# Patient Record
Sex: Male | Born: 1950 | Race: White | Hispanic: No | Marital: Single | Smoking: Current some day smoker
Health system: Southern US, Community
[De-identification: ages and names within clinical notes are randomized; demographics above are authoritative.]

## PROBLEM LIST (undated history)

## (undated) DIAGNOSIS — M5134 Other intervertebral disc degeneration, thoracic region: Secondary | ICD-10-CM

## (undated) DIAGNOSIS — IMO0002 Reserved for concepts with insufficient information to code with codable children: Secondary | ICD-10-CM

## (undated) DIAGNOSIS — M5020 Other cervical disc displacement, unspecified cervical region: Secondary | ICD-10-CM

## (undated) DIAGNOSIS — G039 Meningitis, unspecified: Secondary | ICD-10-CM

## (undated) DIAGNOSIS — I10 Essential (primary) hypertension: Secondary | ICD-10-CM

## (undated) DIAGNOSIS — K219 Gastro-esophageal reflux disease without esophagitis: Secondary | ICD-10-CM

## (undated) DIAGNOSIS — M51369 Other intervertebral disc degeneration, lumbar region without mention of lumbar back pain or lower extremity pain: Secondary | ICD-10-CM

## (undated) DIAGNOSIS — E1151 Type 2 diabetes mellitus with diabetic peripheral angiopathy without gangrene: Secondary | ICD-10-CM

## (undated) DIAGNOSIS — M4804 Spinal stenosis, thoracic region: Secondary | ICD-10-CM

## (undated) DIAGNOSIS — E039 Hypothyroidism, unspecified: Secondary | ICD-10-CM

## (undated) DIAGNOSIS — M1990 Unspecified osteoarthritis, unspecified site: Secondary | ICD-10-CM

## (undated) DIAGNOSIS — M5124 Other intervertebral disc displacement, thoracic region: Secondary | ICD-10-CM

## (undated) DIAGNOSIS — E349 Endocrine disorder, unspecified: Secondary | ICD-10-CM

## (undated) DIAGNOSIS — K861 Other chronic pancreatitis: Secondary | ICD-10-CM

## (undated) DIAGNOSIS — M5030 Other cervical disc degeneration, unspecified cervical region: Secondary | ICD-10-CM

## (undated) DIAGNOSIS — K449 Diaphragmatic hernia without obstruction or gangrene: Secondary | ICD-10-CM

## (undated) DIAGNOSIS — G8929 Other chronic pain: Secondary | ICD-10-CM

## (undated) DIAGNOSIS — D649 Anemia, unspecified: Secondary | ICD-10-CM

## (undated) DIAGNOSIS — IMO0001 Reserved for inherently not codable concepts without codable children: Secondary | ICD-10-CM

## (undated) DIAGNOSIS — J189 Pneumonia, unspecified organism: Secondary | ICD-10-CM

## (undated) DIAGNOSIS — M4807 Spinal stenosis, lumbosacral region: Secondary | ICD-10-CM

## (undated) DIAGNOSIS — E785 Hyperlipidemia, unspecified: Secondary | ICD-10-CM

## (undated) DIAGNOSIS — I82409 Acute embolism and thrombosis of unspecified deep veins of unspecified lower extremity: Secondary | ICD-10-CM

## (undated) DIAGNOSIS — I739 Peripheral vascular disease, unspecified: Secondary | ICD-10-CM

## (undated) DIAGNOSIS — M5136 Other intervertebral disc degeneration, lumbar region: Secondary | ICD-10-CM

## (undated) DIAGNOSIS — N529 Male erectile dysfunction, unspecified: Secondary | ICD-10-CM

## (undated) DIAGNOSIS — M4802 Spinal stenosis, cervical region: Secondary | ICD-10-CM

## (undated) DIAGNOSIS — R011 Cardiac murmur, unspecified: Secondary | ICD-10-CM

## (undated) HISTORY — PX: BACK SURGERY: SHX140

## (undated) HISTORY — DX: Other intervertebral disc degeneration, lumbar region without mention of lumbar back pain or lower extremity pain: M51.369

## (undated) HISTORY — DX: Type 2 diabetes mellitus with diabetic peripheral angiopathy without gangrene: E11.51

## (undated) HISTORY — DX: Diaphragmatic hernia without obstruction or gangrene: K44.9

## (undated) HISTORY — PX: GASTROJEJUNOSTOMY: SHX1697

## (undated) HISTORY — DX: Spinal stenosis, thoracic region: M48.04

## (undated) HISTORY — DX: Hyperlipidemia, unspecified: E78.5

## (undated) HISTORY — DX: Hypothyroidism, unspecified: E03.9

## (undated) HISTORY — PX: LEG SURGERY: SHX1003

## (undated) HISTORY — DX: Male erectile dysfunction, unspecified: N52.9

## (undated) HISTORY — PX: KIDNEY STONE SURGERY: SHX686

## (undated) HISTORY — DX: Reserved for concepts with insufficient information to code with codable children: IMO0002

## (undated) HISTORY — DX: Essential (primary) hypertension: I10

## (undated) HISTORY — DX: Reserved for inherently not codable concepts without codable children: IMO0001

## (undated) HISTORY — DX: Other intervertebral disc displacement, thoracic region: M51.24

## (undated) HISTORY — DX: Anemia, unspecified: D64.9

## (undated) HISTORY — DX: Unspecified osteoarthritis, unspecified site: M19.90

## (undated) HISTORY — DX: Other intervertebral disc degeneration, thoracic region: M51.34

## (undated) HISTORY — PX: SPINE SURGERY: SHX786

## (undated) HISTORY — DX: Other chronic pancreatitis: K86.1

## (undated) HISTORY — DX: Other intervertebral disc degeneration, lumbar region: M51.36

## (undated) HISTORY — DX: Other cervical disc degeneration, unspecified cervical region: M50.30

## (undated) HISTORY — DX: Pneumonia, unspecified organism: J18.9

## (undated) HISTORY — DX: Other cervical disc displacement, unspecified cervical region: M50.20

## (undated) HISTORY — DX: Other chronic pain: G89.29

## (undated) HISTORY — DX: Cardiac murmur, unspecified: R01.1

## (undated) HISTORY — DX: Acute embolism and thrombosis of unspecified deep veins of unspecified lower extremity: I82.409

## (undated) HISTORY — DX: Peripheral vascular disease, unspecified: I73.9

## (undated) HISTORY — DX: Meningitis, unspecified: G03.9

## (undated) HISTORY — DX: Gastro-esophageal reflux disease without esophagitis: K21.9

## (undated) HISTORY — DX: Endocrine disorder, unspecified: E34.9

## (undated) HISTORY — DX: Spinal stenosis, cervical region: M48.02

## (undated) HISTORY — DX: Spinal stenosis, lumbosacral region: M48.07

---

## 1993-07-16 HISTORY — PX: CARPAL TUNNEL RELEASE: SHX101

## 1993-07-16 HISTORY — PX: TRUNCAL VAGOTOMY: SHX2582

## 2000-10-18 ENCOUNTER — Emergency Department (HOSPITAL_COMMUNITY): Admission: EM | Admit: 2000-10-18 | Discharge: 2000-10-18 | Payer: Self-pay | Admitting: Emergency Medicine

## 2000-10-18 ENCOUNTER — Encounter: Payer: Self-pay | Admitting: Emergency Medicine

## 2002-03-14 ENCOUNTER — Emergency Department (HOSPITAL_COMMUNITY): Admission: EM | Admit: 2002-03-14 | Discharge: 2002-03-14 | Payer: Self-pay | Admitting: Emergency Medicine

## 2003-03-11 ENCOUNTER — Encounter: Payer: Self-pay | Admitting: Internal Medicine

## 2003-03-11 ENCOUNTER — Ambulatory Visit (HOSPITAL_COMMUNITY): Admission: RE | Admit: 2003-03-11 | Discharge: 2003-03-11 | Payer: Self-pay | Admitting: Internal Medicine

## 2008-05-28 ENCOUNTER — Encounter: Admission: RE | Admit: 2008-05-28 | Discharge: 2008-05-28 | Payer: Self-pay | Admitting: Internal Medicine

## 2008-08-04 ENCOUNTER — Ambulatory Visit: Payer: Self-pay | Admitting: Vascular Surgery

## 2009-04-07 ENCOUNTER — Ambulatory Visit: Payer: Self-pay | Admitting: Vascular Surgery

## 2009-08-25 ENCOUNTER — Ambulatory Visit: Payer: Self-pay | Admitting: Vascular Surgery

## 2009-09-05 ENCOUNTER — Ambulatory Visit (HOSPITAL_COMMUNITY): Admission: RE | Admit: 2009-09-05 | Discharge: 2009-09-05 | Payer: Self-pay | Admitting: Vascular Surgery

## 2009-09-05 ENCOUNTER — Ambulatory Visit: Payer: Self-pay | Admitting: Vascular Surgery

## 2009-10-16 ENCOUNTER — Encounter: Payer: Self-pay | Admitting: Emergency Medicine

## 2009-10-16 ENCOUNTER — Ambulatory Visit: Payer: Self-pay | Admitting: Vascular Surgery

## 2009-10-16 ENCOUNTER — Inpatient Hospital Stay (HOSPITAL_COMMUNITY): Admission: EM | Admit: 2009-10-16 | Discharge: 2009-10-24 | Payer: Self-pay | Admitting: Cardiothoracic Surgery

## 2009-10-17 ENCOUNTER — Encounter: Payer: Self-pay | Admitting: Vascular Surgery

## 2009-11-17 ENCOUNTER — Ambulatory Visit: Payer: Self-pay | Admitting: Vascular Surgery

## 2010-02-09 ENCOUNTER — Ambulatory Visit: Payer: Self-pay | Admitting: Vascular Surgery

## 2010-08-16 ENCOUNTER — Ambulatory Visit: Admit: 2010-08-16 | Payer: Self-pay | Admitting: Vascular Surgery

## 2010-08-16 ENCOUNTER — Ambulatory Visit: Payer: Self-pay | Admitting: Vascular Surgery

## 2010-09-13 ENCOUNTER — Ambulatory Visit: Payer: Self-pay | Admitting: Vascular Surgery

## 2010-10-04 ENCOUNTER — Ambulatory Visit: Payer: Self-pay | Admitting: Vascular Surgery

## 2010-10-04 LAB — CBC
HCT: 32.8 % — ABNORMAL LOW (ref 39.0–52.0)
HCT: 33.8 % — ABNORMAL LOW (ref 39.0–52.0)
Hemoglobin: 10.9 g/dL — ABNORMAL LOW (ref 13.0–17.0)
MCHC: 33.4 g/dL (ref 30.0–36.0)
MCHC: 33.7 g/dL (ref 30.0–36.0)
MCV: 92.4 fL (ref 78.0–100.0)
MCV: 93.7 fL (ref 78.0–100.0)
Platelets: 257 10*3/uL (ref 150–400)
Platelets: 277 10*3/uL (ref 150–400)
Platelets: 301 10*3/uL (ref 150–400)
RBC: 3.49 MIL/uL — ABNORMAL LOW (ref 4.22–5.81)
RDW: 12.9 % (ref 11.5–15.5)
RDW: 13 % (ref 11.5–15.5)
WBC: 5.9 10*3/uL (ref 4.0–10.5)
WBC: 6.4 10*3/uL (ref 4.0–10.5)

## 2010-10-04 LAB — DIFFERENTIAL
Basophils Absolute: 0 10*3/uL (ref 0.0–0.1)
Lymphocytes Relative: 32 % (ref 12–46)
Lymphs Abs: 2 10*3/uL (ref 0.7–4.0)
Neutrophils Relative %: 53 % (ref 43–77)

## 2010-10-04 LAB — BASIC METABOLIC PANEL
BUN: 12 mg/dL (ref 6–23)
BUN: 14 mg/dL (ref 6–23)
BUN: 22 mg/dL (ref 6–23)
CO2: 27 mEq/L (ref 19–32)
CO2: 30 mEq/L (ref 19–32)
Calcium: 8.5 mg/dL (ref 8.4–10.5)
Calcium: 9.1 mg/dL (ref 8.4–10.5)
Chloride: 101 mEq/L (ref 96–112)
Chloride: 109 mEq/L (ref 96–112)
Creatinine, Ser: 0.84 mg/dL (ref 0.4–1.5)
Creatinine, Ser: 0.98 mg/dL (ref 0.4–1.5)
GFR calc Af Amer: >60 mL/min (ref >60)
GFR calc non Af Amer: >60 mL/min (ref >60)
GFR calc non Af Amer: >60 mL/min (ref >60)
GFR calc non Af Amer: >60 mL/min (ref >60)
Glucose, Bld: 76 mg/dL (ref 70–99)
Glucose, Bld: 93 mg/dL (ref 70–99)
Potassium: 4.3 mEq/L (ref 3.5–5.1)
Potassium: 4.4 mEq/L (ref 3.5–5.1)
Sodium: 139 mEq/L (ref 135–145)

## 2010-10-04 LAB — URINALYSIS, ROUTINE W REFLEX MICROSCOPIC
Glucose, UA: NEGATIVE mg/dL
Nitrite: NEGATIVE
Protein, ur: NEGATIVE mg/dL
pH: 6 (ref 5.0–8.0)

## 2010-10-04 LAB — POCT I-STAT, CHEM 8
Chloride: 105 mEq/L (ref 96–112)
Creatinine, Ser: 1.3 mg/dL (ref 0.4–1.5)
Glucose, Bld: 88 mg/dL (ref 70–99)
HCT: 38 % — ABNORMAL LOW (ref 39.0–52.0)
Potassium: 4 mEq/L (ref 3.5–5.1)

## 2010-10-04 LAB — GLUCOSE, CAPILLARY: Glucose-Capillary: 104 mg/dL — ABNORMAL HIGH (ref 70–99)

## 2010-10-08 ENCOUNTER — Emergency Department (HOSPITAL_COMMUNITY)
Admission: EM | Admit: 2010-10-08 | Discharge: 2010-10-08 | Discharge status: Against Medical Advice | Payer: Medicare Other | Attending: Emergency Medicine | Admitting: Emergency Medicine

## 2010-10-08 DIAGNOSIS — R51 Headache: Secondary | ICD-10-CM | POA: Insufficient documentation

## 2010-10-18 ENCOUNTER — Ambulatory Visit: Payer: Self-pay | Admitting: Vascular Surgery

## 2010-11-08 ENCOUNTER — Encounter (INDEPENDENT_AMBULATORY_CARE_PROVIDER_SITE_OTHER): Payer: Medicare Other

## 2010-11-08 ENCOUNTER — Ambulatory Visit (INDEPENDENT_AMBULATORY_CARE_PROVIDER_SITE_OTHER): Payer: Medicare Other | Admitting: Vascular Surgery

## 2010-11-08 DIAGNOSIS — I739 Peripheral vascular disease, unspecified: Secondary | ICD-10-CM

## 2010-11-09 NOTE — Assessment & Plan Note (Signed)
OFFICE VISIT  Austin Barnes, Austin Barnes DOB:  [birth year]                                       11/08/2010 ZOXWR#:[id number]  I saw patient in the office today for continued follow-up of his peripheral vascular disease.  I last saw him in July 2011.  Austin Barnes had been admitted in April with a left foot infection and was placed on intravenous antibiotics.  Austin Barnes underwent an arteriogram which showed severe tibial occlusive disease bilaterally.  On the left side, which at that time was a problematic side, the only patent distal vessel was a small, diseased posterior tibial artery which is patent over a short segment with occlusion above the ankle.  There were no good options for revascularization.  Since I saw him last, Austin Barnes continues to describe mostly burning pain in both feet which is more significant on the right side.  Austin Barnes also describes some claudication in his calves at approximately 1-2 blocks. Austin Barnes does not describe any classic rest pain, and Austin Barnes has had no further problems with nonhealing wounds.  The burning pain in his feet is fairly constant with really no aggravating or alleviating factors.  PAST MEDICAL HISTORY:  Significant for hypertension and hypercholesterolemia, which are stable on his current medications.  SOCIAL HISTORY:  Austin Barnes is single.  Austin Barnes tells me that Austin Barnes has quit tobacco completely approximately 3 years ago.  REVIEW OF SYSTEMS:  CARDIOVASCULAR:  Austin Barnes does occasionally get some mild chest pressure, which has been stable.  Austin Barnes admits to some orthopnea.  Austin Barnes had a history of phlebitis in the past. PULMONARY:  Austin Barnes has had a productive cough and bronchitis.  PHYSICAL EXAMINATION:  This is a pleasant 60 year old gentleman who appears his stated age.  Blood pressure is 163/77, heart rate is 89, respiratory rate is 20.  Lungs are clear bilaterally to auscultation without rales, rhonchi or wheezing.  On cardiovascular exam, I do not detect any carotid bruits.  Austin Barnes has a  regular rate and rhythm.  Austin Barnes has palpable femoral pulses.  Austin Barnes actually had a palpable left dorsalis pedis pulse and a right posterior tibial pulse.  Both feet appear adequately perfused.  Abdomen is soft and nontender with normal-pitched bowel sounds.  Neurologic:  Austin Barnes has no focal weakness or paresthesias.  I did independently interpret his arterial Doppler study, which shows an ABI of 69% on the right and 77% on the left.  Austin Barnes has a biphasic right posterior tibial and dorsalis pedis signal with a monophasic left posterior tibial signal and a biphasic dorsalis pedis signal.  I reassured him that I think his circulation is actually very reasonable, and I think this his symptoms in his feet are mostly neurogenic.  I do not think that any further vascular workup is indicated at this point unless Austin Barnes develops any nonhealing wounds.  Given the excellent improvement in his circulation, if Austin Barnes does develop a wound in the future, I think it would be worth considering restudying him.  I will plan on seeing him back in 1 year with follow-up ABI's, which I have ordered.  Austin Barnes knows to call sooner if Austin Barnes has problems.    Di Kindle. Edilia Bo, M.D. Electronically Signed  CSD/MEDQ  D:  11/08/2010  T:  11/09/2010  Job:  [id number]  cc:   Lenon Curt Chilton Si, M.D.

## 2010-11-28 NOTE — Consult Note (Signed)
NEW PATIENT CONSULTATION   Austin Barnes, Austin Barnes  DOB:  [birth year]                                       08/04/2008  [id number]#:[id number]   I saw the patient in the office today in consultation concerning his leg  pain.  This is a pleasant 59 year old gentleman who has had a long  history of pain in both legs and quite a complicated history.  He  sustained a crush injury to his right leg in 1988 and the best I can  tell he has really had pain in the leg since that time.  He has some  pins and needles sensation in the leg and states that the leg  essentially hurts continually.  He is unable to fully straighten his  knee.  A year ago he fell and twisted his left ankle and has had some  pins and needles in the left foot and pain in the left foot also.  He  states that his toes hurt more so on the right side than the left side  but that his feet also hurt and in his words he feels like his toes are  broken.  He also notes that approximately a year ago he developed a  black third toe which gradually resolved.  He is ambulatory and I do not  get any clear-cut history of calf, thigh or hip claudication.  He does  have some hip pain which apparently is related to his osteoporosis.  In  addition, I do not get any classic history of rest pain or history of  nonhealing ulcers.  His symptoms have remained have remained relatively  stable over the last year although over the last several months his  symptoms have progressed somewhat.  There really are no aggravating or  alleviating factors associated with this pain.  Elevation does not seem  to make the pain better or worse really.   PAST MEDICAL HISTORY:  Significant for hypertension,  hypercholesterolemia and chronic pancreatitis.  He has had previous  abdominal surgery by Dr. 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  He denies any history of  diabetes, history of previous myocardial  infarction, history of  congestive heart failure or history of COPD.   FAMILY HISTORY:  His father died from myocardial infarction at age 77.  He is unaware of any other history of premature cardiovascular disease.   SOCIAL HISTORY:  He is single and does not have any children.  He is on  disability.  He smokes one cigarette a day.  He does not use alcohol on  a regular basis.   MEDICATIONS AND REVIEW OF SYSTEMS:  Are documented on the medical  history form in his chart.   PHYSICAL EXAMINATION:  General:  This is a pleasant 60 year old  gentleman who appears his stated age.  Vital signs:  Temperature is  98.4, blood pressure is 178/117, heart rate is 94.  Neck:  Neck is  supple.  I do not detect any cervical lymphadenopathy.  I do not detect  any carotid bruits.  Lungs:  Are clear bilaterally to auscultation.  Cardiac:  He has a regular rate and rhythm.  Abdomen:  Soft and  nontender.  He has a midline incision which is healed.  Vascular:  He  has palpable femoral pulses  and a palpable left popliteal, left dorsalis  pedis and left posterior tibial pulse.  On the right side I cannot  palpate a popliteal or pedal pulses.  He has monophasic anterior tibial  and posterior tibial signal on the right with biphasic dorsalis pedis  and posterior tibial signal on the left.  He has some telangiectasias  and varicose veins bilaterally but no significant swelling or  hyperpigmentation.  Neurological:  Exam is nonfocal.   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  Certainly I cannot explain the pain in his left leg from a vascular  standpoint.  He has no evidence of significant arterial insufficiency on  the left or venous insufficiency.  On the right side he does have  significant infrainguinal disease with an ABI of 59% based on the study  back in November.  He has dampened monophasic signals in  the right foot  and given the previous history of discoloration of the third toe I have  recommended that we proceed with arteriography to further evaluate the  arterial status on the right.  I have explained that I am not convinced  his symptoms are all related to his peripheral vascular disease but I  think it would be helpful to sort out his pain.  We have discussed  arteriography and the potential complications associated with this  including but not limited to bleeding, arterial injury and dye reaction.  All of his questions were answered and he would like to discuss this  further with Dr. Chilton Si before allowing [place] to proceed.  If he is  agreeable to proceed with arteriography we will make further  recommendations pending these results.  I did explain that I did not  think that he had to have the arteriogram as he does not have a limb  threatening situation currently.  He has had no nonhealing ulcers.  However, again, I think that information would be useful in helping to  sort out his pain.   Di Kindle. Edilia Bo, M.D.  Electronically Signed   CSD/MEDQ  D:  08/04/2008  T:  08/05/2008  Job:  [id number]   cc:   Lenon Curt. Chilton Si, M.D.

## 2010-11-28 NOTE — Assessment & Plan Note (Signed)
OFFICE VISIT   ISAHI, GODWIN  DOB:  [birth year]                                       04/07/2009  ZOXWR#:[id number]   I saw the patient in the office today for continued follow-up of his  pain in his right foot.  I initially saw him in consultation in January  2010 with leg pain bilaterally.  At that time he had evidence of some  infrainguinal arterial occlusive disease on the right with no  significant arterial occlusive disease on the left.  I did not think  that his symptoms could be attributed to his peripheral vascular  disease.  However, given his very complex history we discussed  potentially proceeding with arteriography to further assess his arterial  disease on the right.  At that time he did not want to proceed with  arteriography.   He continued to have pain mostly in the right leg and comes in for a  follow-up visit.  His activity is fairly limited and he experiences pain  in the right leg constantly with no real alleviating factors.  He has a  lesser degree of pain in the left leg.  His medical history has not  changed since I saw him back in January.  On review of systems he has  had no recent chest pain, chest pressure, palpitations or arrhythmias.  He has had no productive cough, bronchitis, asthma or wheezing.   On physical examination, this a pleasant 60 year old gentleman who  appears his stated age.  The blood pressure is 171/120, heart rate is  80.  Lungs:  Are clear bilaterally to auscultation.  Cardiac Exam:  He  has a regular rate and rhythm.  Has palpable femoral, popliteal,  dorsalis pedis and posterior tibial pulse on the left.  On the right  side he has a palpable femoral pulse and a diminished popliteal pulse, I  cannot palpate pedal pulses.  He has monophasic Doppler signals in the  right anterior tibial and peroneal positions with an almost biphasic  posterior tibial signal on the right.   Given his history of trauma to  the right leg I think most of his pain  can be attributed to neuropathic pain and causalgia.  However, he does  have some evidence of infrainguinal arterial occlusive disease in the  right and given his complex history, all things considered, I think it  would be very reasonable to proceed with arteriography to further  delineate the extent of his infrainguinal arterial occlusive disease.  We have discussed the indications for the procedure and potential  complications including, but not limited to, bleeding and arterial  injury.  All of his questions were answered and he would like to discuss  this further with Dr. Chilton Si before electing to schedule the arteriogram.  Again, I have explained that I think his symptoms are more nerve related  and not related to his peripheral vascular disease.  However, I think  this test would be useful to help be sure there is not a significant  component of arterial insufficiency.  Will schedule his arteriogram once  we hear back from him.   Di Kindle. Edilia Bo, M.D.  Electronically Signed   CSD/MEDQ  D:  04/07/2009  T:  04/08/2009  Job:  [id number]   cc:   Lenon Curt. Chilton Si, M.D.

## 2010-11-28 NOTE — Assessment & Plan Note (Signed)
OFFICE VISIT   AKAI, DOLLARD  DOB:  October 20, 1950                                       02/09/2010  YNWGN#:[id number]   I saw the patient in the office today in followup of his peripheral  vascular disease.  He had been admitted in April with a left foot  infection.  He was placed on intravenous antibiotics.  He underwent an  arteriogram at that time which showed severe tibial artery occlusive  disease bilaterally.  On the left side which was the problematic side  the only patent distal vessel was a small diseased posterior tibial  artery which was patent over a short segment with occlusion above the  ankle.  There were really no good options for revascularization.  On the  right side he had reconstitution of his posterior tibial artery but  again this was occluded distally.  Given that there are no options for  revascularization we simply continued his intravenous antibiotics and  ultimately he was discharged on p.o. antibiotics.  He comes for an  outpatient visit.   He has multiple complaints of pain in his leg which he has had for  years.  I do not think there has been any significant change in the  character of his pain.   On review of systems he has had no fever or chills.   On physical examination this is a pleasant 60 year old gentleman who  appears his stated age.  Blood pressure is 162/101, heart rate is 62,  temperature is 98.4.  He has palpable femoral pulses bilaterally.  I  cannot palpate pedal pulses.  He actually has biphasic posterior tibial  signals with the Doppler and the cellulitis in the left foot has  resolved.   Based on his Doppler exam and the looks of his foot he has extensive  collaterals which have compensated for his severe tibial artery  occlusive disease.  Fortunately he is not a smoker.  I have simply  encouraged him to stay as active as possible and I plan on seeing him  back in 6 months with followup ABIs.  He knows to  call sooner if he has  problems.     Di Kindle. Edilia Bo, M.D.  Electronically Signed   CSD/MEDQ  D:  02/09/2010  T:  02/09/2010  Job:  [id number]   cc:   Lenon Curt. Chilton Si, M.D.

## 2010-11-28 NOTE — Assessment & Plan Note (Signed)
OFFICE VISIT   KC, SUMMERSON  DOB:  March 01, 1951                                       11/17/2009  [id number]#:[id number]   I saw the patient in the office today for continued followup of his  cellulitis of his left third toe.  He had been admitted to the hospital  at [facility] with cellulitis of the left third toe.  He underwent an  arteriogram, which showed occlusion of the popliteal artery on the left  with severe tibial occlusive disease.  The only thing that reconstituted  distally was a diseased small posterior tibial artery, which occluded  above the ankle.  He was not felt to be a good candidate for  revascularization.  He was discharged on p.o. Flagyl and Cipro, and he  comes in for his first outpatient visit.  He continues to have some pain  in both feet, but this has not changed significantly in character.  There are really no aggravating or alleviating factors.  The pain is  fairly constant.   REVIEW OF SYSTEMS:  He has had no fever or chills.  He has had no  significant shortness of breath.   PHYSICAL EXAMINATION:  This is a pleasant 61 year old gentleman who  appears his stated age.  His blood pressure is 182/101, heart rate is  72, temperature is 99.3.  He has a palpable femoral pulse.  He has a  monophasic posterior tibial signal on the left with the Doppler.  His  cellulitis in the third toe has essentially resolved.  He does have an  eschar on the plantar aspect of his third toe with no significant  drainage.  There are no other ulcers on the left foot.   Fortunately, the wounds on the third toe have gradually improve and the  cellulitis has resolved.  I have written him a prescription for 2 more  weeks of Cipro and Flagyl and he will continue to soak his foot and do  the dressing changes himself.  Hopefully, this will continue to  gradually heal.  I plan on seeing him back in 6 weeks.  He knows to call  sooner if he has problems.     Di Kindle. Edilia Bo, M.D.  Electronically Signed   CSD/MEDQ  D:  11/17/2009  T:  11/18/2009  Job:  [id number]   cc:   Dr. Frederik Pear

## 2010-11-28 NOTE — H&P (Signed)
HISTORY AND PHYSICAL EXAMINATION   August 25, 2009   Re:  Austin Barnes, Austin Barnes                DOB:  [birth year]   Patient is a 60 year old gentleman with a very complex medical history,  who has been followed by Dr. Edilia Bo for pain in his bilateral lower  extremities.  Although his pain is atypical for vascular symptoms, he  continues to complain of his symptoms, which is basically burning in his  feet and lower legs, right greater than left.  He was last seen  04/07/2009.  At that time, Dr. Edilia Bo did not feel that his symptoms  could be attributed to peripheral vascular disease but given his very  complex history, the discussion centered upon potentially proceeding  with an arteriogram to assess his arterial disease on the right.  At  that time, he desired to confer with his personal family physician.   PAST MEDICAL HISTORY:  Hypertension, hypercholesterolemia, chronic  pancreatitis, ulcers for which he underwent a vagotomy.  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   Current medications consist of oxycodone 20 mg q.4h., diazepam 10 mg  q.i.d., methadone 40 mg p.o. q.4h., losartan 100 mg p.o. daily, Soma 350  mg p.r.n. muscle spasms, Prilosec 40 mg p.o. daily, levothyroxine 0.25  mcg daily for thyroid, Lotrimin daily for his feet, Dermoplast for feet,  vitamins for the elderly and vitamin E.   His allergies consist of sulfa, penicillin, codeine and Feldene.   FAMILY HISTORY:  His father died from myocardial infarction at the age  of 57.   SOCIAL HISTORY:  He is single, has no children.  He is divorced.  He is  on disability and has a remote tobacco history.  He does not use alcohol  on a regular basis.   REVIEW OF SYSTEMS:  A complete review of systems was undertaken and was  significant for pain in his legs and feet which has no aggravating or  mitigating factors.   Physical findings revealed  a 60 year old gentleman appearing his stated  age, walking with a cane.  Blood pressure was 180/103, heart rate 89, O2  saturation was 97%.  HEENT:  NCAT, EOMI, PERRLA.  Mucous membranes were  pink and moist.  Neck exhibited a full range of motion.  Lungs were  clear to auscultation.  Cardiac exam revealed a regular rate and rhythm.  The abdomen was soft, nontender, nondistended.  Musculoskeletal exam  demonstrated no gross deformities.  Neurological exam was nonfocal with  the exception that he required a cane for walking.  Mood was appropriate  to his situation.   LABORATORY STUDIES:  ABIs were performed at this evaluation.  On the  right it was 0.71, on the left 0.89.  The Doppler waveforms were  monophasic on the right side and biphasic on the left side.   MEDICAL DECISION MAKING:  The patient was amenable to an aortogram with  runoff in order to assess his vascular system of his lower extremities.  Risks quoted included bleeding and possible contrast reactions.  At this  time, I asked Dr. Edilia Bo to assist in evaluating patient.  Dr. Edilia Bo  entered the room and further discussed aortogram and aortography with  patient.  He was scheduled to undergo aortogram on 09/05/09.   Wilmon Arms, PA   Austin Barnes. Edilia Bo, M.D.  Electronically Signed   KEL/MEDQ  D:  08/25/2009  T:  08/25/2009  Job:  [id number]

## 2011-07-17 DIAGNOSIS — E349 Endocrine disorder, unspecified: Secondary | ICD-10-CM

## 2011-07-17 HISTORY — DX: Endocrine disorder, unspecified: E34.9

## 2011-08-15 ENCOUNTER — Other Ambulatory Visit: Payer: Self-pay | Admitting: Internal Medicine

## 2011-08-15 DIAGNOSIS — E559 Vitamin D deficiency, unspecified: Secondary | ICD-10-CM

## 2011-08-21 ENCOUNTER — Ambulatory Visit
Admission: RE | Admit: 2011-08-21 | Discharge: 2011-08-21 | Disposition: A | Discharge status: Home / Self Care | Payer: Medicare Other | Source: Ambulatory Visit | Attending: Internal Medicine | Admitting: Internal Medicine

## 2011-08-21 DIAGNOSIS — M810 Age-related osteoporosis without current pathological fracture: Secondary | ICD-10-CM | POA: Diagnosis not present

## 2011-08-21 DIAGNOSIS — E559 Vitamin D deficiency, unspecified: Secondary | ICD-10-CM

## 2011-09-12 DIAGNOSIS — R112 Nausea with vomiting, unspecified: Secondary | ICD-10-CM | POA: Diagnosis not present

## 2011-09-12 DIAGNOSIS — E559 Vitamin D deficiency, unspecified: Secondary | ICD-10-CM | POA: Diagnosis not present

## 2011-09-12 DIAGNOSIS — D649 Anemia, unspecified: Secondary | ICD-10-CM | POA: Diagnosis not present

## 2011-09-12 DIAGNOSIS — E039 Hypothyroidism, unspecified: Secondary | ICD-10-CM | POA: Diagnosis not present

## 2011-09-12 DIAGNOSIS — F329 Major depressive disorder, single episode, unspecified: Secondary | ICD-10-CM | POA: Diagnosis not present

## 2011-09-12 DIAGNOSIS — E291 Testicular hypofunction: Secondary | ICD-10-CM | POA: Diagnosis not present

## 2011-09-12 DIAGNOSIS — E119 Type 2 diabetes mellitus without complications: Secondary | ICD-10-CM | POA: Diagnosis not present

## 2011-09-12 DIAGNOSIS — G609 Hereditary and idiopathic neuropathy, unspecified: Secondary | ICD-10-CM | POA: Diagnosis not present

## 2011-10-31 ENCOUNTER — Encounter: Payer: Self-pay | Admitting: Vascular Surgery

## 2011-11-07 ENCOUNTER — Ambulatory Visit: Payer: Medicare Other | Admitting: Vascular Surgery

## 2011-11-14 ENCOUNTER — Ambulatory Visit: Payer: Medicare Other | Admitting: Vascular Surgery

## 2011-12-25 ENCOUNTER — Encounter: Payer: Self-pay | Admitting: Neurosurgery

## 2011-12-26 ENCOUNTER — Ambulatory Visit: Payer: Medicare Other | Admitting: Neurosurgery

## 2012-01-15 ENCOUNTER — Encounter: Payer: Self-pay | Admitting: Neurosurgery

## 2012-01-16 ENCOUNTER — Ambulatory Visit: Payer: Medicare Other | Admitting: Neurosurgery

## 2012-01-22 ENCOUNTER — Encounter: Payer: Self-pay | Admitting: Neurosurgery

## 2012-01-23 ENCOUNTER — Telehealth: Payer: Self-pay | Admitting: Vascular Surgery

## 2012-01-23 ENCOUNTER — Ambulatory Visit: Payer: Medicare Other | Admitting: Neurosurgery

## 2012-01-23 NOTE — Telephone Encounter (Signed)
Our office received a message from the answering service this morning to call patient. He left a message stating he wanted to only see CSD and not the PA or NP. I called him back at 9:15am and left message to let him know that although his appt today was with Wray Kearns that  Dr.Dickson would also be in the office today and would be able to evaluate him as well. I encouraged him to keep today's appt on the voicemail. I was concerned because I spoke with the patient last week and he had to reschedule from 01/16/12 due to transportation issues. He told me then that he was in a lot of pain and his foot was somewhat discolored. He missed his appointment for today as well. I called him at 3:45pm and left another message regarding his missed appt. Jacklyn Shell

## 2012-02-12 DIAGNOSIS — L02619 Cutaneous abscess of unspecified foot: Secondary | ICD-10-CM | POA: Diagnosis not present

## 2012-02-12 DIAGNOSIS — I10 Essential (primary) hypertension: Secondary | ICD-10-CM | POA: Diagnosis not present

## 2012-02-19 DIAGNOSIS — IMO0002 Reserved for concepts with insufficient information to code with codable children: Secondary | ICD-10-CM | POA: Diagnosis not present

## 2012-03-05 DIAGNOSIS — E785 Hyperlipidemia, unspecified: Secondary | ICD-10-CM | POA: Diagnosis not present

## 2012-03-05 DIAGNOSIS — E1129 Type 2 diabetes mellitus with other diabetic kidney complication: Secondary | ICD-10-CM | POA: Diagnosis not present

## 2012-03-05 DIAGNOSIS — I739 Peripheral vascular disease, unspecified: Secondary | ICD-10-CM | POA: Diagnosis not present

## 2012-03-05 DIAGNOSIS — I10 Essential (primary) hypertension: Secondary | ICD-10-CM | POA: Diagnosis not present

## 2012-03-05 DIAGNOSIS — L02619 Cutaneous abscess of unspecified foot: Secondary | ICD-10-CM | POA: Diagnosis not present

## 2012-10-07 ENCOUNTER — Other Ambulatory Visit: Payer: Self-pay | Admitting: *Deleted

## 2012-10-07 ENCOUNTER — Other Ambulatory Visit: Payer: Self-pay | Admitting: Internal Medicine

## 2012-10-07 MED ORDER — MINOXIDIL 10 MG PO TABS: ORAL_TABLET | ORAL | Status: DC

## 2012-10-15 ENCOUNTER — Other Ambulatory Visit: Payer: Self-pay | Admitting: *Deleted

## 2012-10-15 MED ORDER — DIAZEPAM 10 MG PO TABS: ORAL_TABLET | ORAL | Status: DC

## 2012-10-15 MED ORDER — OXYCODONE HCL 5 MG PO TABS: ORAL_TABLET | ORAL | Status: DC

## 2012-10-15 MED ORDER — METHADONE HCL 10 MG PO TABS: ORAL_TABLET | ORAL | Status: DC

## 2012-10-20 ENCOUNTER — Other Ambulatory Visit: Payer: Self-pay | Admitting: *Deleted

## 2012-10-21 ENCOUNTER — Other Ambulatory Visit: Payer: Self-pay | Admitting: Internal Medicine

## 2012-10-21 ENCOUNTER — Other Ambulatory Visit: Payer: Self-pay | Admitting: *Deleted

## 2012-10-21 MED ORDER — MAGIC MOUTHWASH: ORAL | Status: DC

## 2012-10-21 MED ORDER — DIPHENHYD-HYDROCORT-NYSTATIN MT SUSP: OROMUCOSAL | Status: DC

## 2012-10-23 ENCOUNTER — Other Ambulatory Visit: Payer: Self-pay | Admitting: Internal Medicine

## 2012-11-07 ENCOUNTER — Other Ambulatory Visit: Payer: Self-pay | Admitting: Internal Medicine

## 2012-11-10 ENCOUNTER — Other Ambulatory Visit: Payer: Self-pay | Admitting: *Deleted

## 2012-11-13 ENCOUNTER — Other Ambulatory Visit: Payer: Self-pay | Admitting: *Deleted

## 2012-11-14 ENCOUNTER — Other Ambulatory Visit: Payer: Self-pay | Admitting: *Deleted

## 2012-11-14 MED ORDER — OXYCODONE HCL 5 MG PO TABS: ORAL_TABLET | ORAL | Status: DC

## 2012-11-14 MED ORDER — DIAZEPAM 10 MG PO TABS: ORAL_TABLET | ORAL | Status: DC

## 2012-11-14 MED ORDER — METHADONE HCL 10 MG PO TABS: ORAL_TABLET | ORAL | Status: DC

## 2012-11-19 ENCOUNTER — Other Ambulatory Visit: Payer: Self-pay | Admitting: Geriatric Medicine

## 2012-11-21 ENCOUNTER — Other Ambulatory Visit: Payer: Self-pay | Admitting: Internal Medicine

## 2012-11-24 ENCOUNTER — Other Ambulatory Visit: Payer: Self-pay | Admitting: *Deleted

## 2012-11-24 MED ORDER — TESTOSTERONE 20.25 MG/1.25GM (1.62%) TD GEL: 2.0000 "application " | Freq: Once | TRANSDERMAL | Status: DC

## 2012-11-27 ENCOUNTER — Other Ambulatory Visit: Payer: Self-pay | Admitting: Internal Medicine

## 2012-12-03 ENCOUNTER — Other Ambulatory Visit: Payer: Self-pay | Admitting: *Deleted

## 2012-12-03 MED ORDER — LEVOTHYROXINE SODIUM 50 MCG PO TABS: ORAL_TABLET | ORAL | Status: DC

## 2012-12-09 ENCOUNTER — Other Ambulatory Visit: Payer: Self-pay | Admitting: Geriatric Medicine

## 2012-12-11 ENCOUNTER — Other Ambulatory Visit: Payer: Self-pay | Admitting: Geriatric Medicine

## 2012-12-11 MED ORDER — METHADONE HCL 10 MG PO TABS: ORAL_TABLET | ORAL | Status: DC

## 2012-12-11 MED ORDER — HYDROCODONE-ACETAMINOPHEN 7.5-325 MG/15ML PO SOLN: ORAL | Status: DC

## 2012-12-11 MED ORDER — OXYCODONE HCL 5 MG PO TABS: ORAL_TABLET | ORAL | Status: DC

## 2012-12-18 ENCOUNTER — Telehealth: Payer: Self-pay | Admitting: *Deleted

## 2012-12-18 NOTE — Telephone Encounter (Signed)
Patient called on Tuesday and left message for me to return call. Tried calling patient back and no answer and left message for him to return my call. Tried again today and No Answer.

## 2012-12-22 ENCOUNTER — Other Ambulatory Visit: Payer: Self-pay | Admitting: *Deleted

## 2012-12-22 MED ORDER — CARISOPRODOL 350 MG PO TABS: ORAL_TABLET | ORAL | Status: DC

## 2013-01-07 ENCOUNTER — Other Ambulatory Visit: Payer: Self-pay | Admitting: *Deleted

## 2013-01-08 ENCOUNTER — Other Ambulatory Visit: Payer: Self-pay | Admitting: *Deleted

## 2013-01-08 MED ORDER — METHADONE HCL 10 MG PO TABS: ORAL_TABLET | ORAL | Status: DC

## 2013-01-08 MED ORDER — OXYCODONE HCL 5 MG PO TABS: ORAL_TABLET | ORAL | Status: DC

## 2013-01-12 ENCOUNTER — Other Ambulatory Visit: Payer: Self-pay | Admitting: *Deleted

## 2013-01-12 MED ORDER — HYDROCODONE-ACETAMINOPHEN 7.5-325 MG/15ML PO SOLN: ORAL | Status: DC

## 2013-02-03 ENCOUNTER — Other Ambulatory Visit: Payer: Self-pay | Admitting: Geriatric Medicine

## 2013-02-03 MED ORDER — HYDROCODONE-ACETAMINOPHEN 7.5-325 MG/15ML PO SOLN: ORAL | Status: DC

## 2013-02-04 ENCOUNTER — Other Ambulatory Visit: Payer: Self-pay | Admitting: Geriatric Medicine

## 2013-02-04 MED ORDER — HYDROCODONE-ACETAMINOPHEN 7.5-325 MG/15ML PO SOLN: ORAL | Status: DC

## 2013-02-04 MED ORDER — OXYCODONE HCL 5 MG PO TABS: ORAL_TABLET | ORAL | Status: DC

## 2013-02-04 MED ORDER — METHADONE HCL 10 MG PO TABS: ORAL_TABLET | ORAL | Status: DC

## 2013-02-17 ENCOUNTER — Other Ambulatory Visit: Payer: Self-pay | Admitting: Internal Medicine

## 2013-02-23 ENCOUNTER — Other Ambulatory Visit: Payer: Self-pay | Admitting: Internal Medicine

## 2013-02-23 MED ORDER — HYDROCODONE-ACETAMINOPHEN 7.5-325 MG/15ML PO SOLN: ORAL | Status: DC

## 2013-03-06 ENCOUNTER — Other Ambulatory Visit: Payer: Self-pay | Admitting: Geriatric Medicine

## 2013-03-06 MED ORDER — METHADONE HCL 10 MG PO TABS: ORAL_TABLET | ORAL | Status: DC

## 2013-03-06 MED ORDER — OXYCODONE HCL 5 MG PO TABS: ORAL_TABLET | ORAL | Status: DC

## 2013-03-23 ENCOUNTER — Other Ambulatory Visit: Payer: Self-pay | Admitting: *Deleted

## 2013-03-23 MED ORDER — HYDROCODONE-ACETAMINOPHEN 7.5-325 MG/15ML PO SOLN: ORAL | Status: DC

## 2013-04-02 ENCOUNTER — Other Ambulatory Visit: Payer: Self-pay | Admitting: *Deleted

## 2013-04-02 MED ORDER — OXYCODONE HCL 5 MG PO TABS: ORAL_TABLET | ORAL | Status: DC

## 2013-04-02 MED ORDER — METHADONE HCL 10 MG PO TABS: ORAL_TABLET | ORAL | Status: DC

## 2013-04-07 ENCOUNTER — Telehealth: Payer: Self-pay

## 2013-04-07 NOTE — Telephone Encounter (Signed)
Left message on Vm informing patient rx for Androgel will be further discussed at pending appointment on 04/08/2013

## 2013-04-08 ENCOUNTER — Ambulatory Visit (INDEPENDENT_AMBULATORY_CARE_PROVIDER_SITE_OTHER): Payer: Medicare Other | Admitting: Internal Medicine

## 2013-04-08 ENCOUNTER — Other Ambulatory Visit: Payer: Self-pay | Admitting: *Deleted

## 2013-04-08 ENCOUNTER — Encounter: Payer: Self-pay | Admitting: Internal Medicine

## 2013-04-08 VITALS — BP 172/100 | HR 116 | Temp 100.6°F | Ht 69.5 in | Wt 212.0 lb

## 2013-04-08 DIAGNOSIS — M1990 Unspecified osteoarthritis, unspecified site: Secondary | ICD-10-CM | POA: Insufficient documentation

## 2013-04-08 DIAGNOSIS — I10 Essential (primary) hypertension: Secondary | ICD-10-CM | POA: Diagnosis not present

## 2013-04-08 DIAGNOSIS — E785 Hyperlipidemia, unspecified: Secondary | ICD-10-CM | POA: Insufficient documentation

## 2013-04-08 DIAGNOSIS — E119 Type 2 diabetes mellitus without complications: Secondary | ICD-10-CM

## 2013-04-08 DIAGNOSIS — E291 Testicular hypofunction: Secondary | ICD-10-CM | POA: Diagnosis not present

## 2013-04-08 DIAGNOSIS — K219 Gastro-esophageal reflux disease without esophagitis: Secondary | ICD-10-CM | POA: Insufficient documentation

## 2013-04-08 DIAGNOSIS — E1151 Type 2 diabetes mellitus with diabetic peripheral angiopathy without gangrene: Secondary | ICD-10-CM

## 2013-04-08 DIAGNOSIS — G8929 Other chronic pain: Secondary | ICD-10-CM | POA: Insufficient documentation

## 2013-04-08 DIAGNOSIS — K449 Diaphragmatic hernia without obstruction or gangrene: Secondary | ICD-10-CM | POA: Diagnosis not present

## 2013-04-08 DIAGNOSIS — R509 Fever, unspecified: Secondary | ICD-10-CM | POA: Diagnosis not present

## 2013-04-08 DIAGNOSIS — D649 Anemia, unspecified: Secondary | ICD-10-CM | POA: Insufficient documentation

## 2013-04-08 DIAGNOSIS — E349 Endocrine disorder, unspecified: Secondary | ICD-10-CM | POA: Insufficient documentation

## 2013-04-08 DIAGNOSIS — M129 Arthropathy, unspecified: Secondary | ICD-10-CM

## 2013-04-08 DIAGNOSIS — N529 Male erectile dysfunction, unspecified: Secondary | ICD-10-CM | POA: Insufficient documentation

## 2013-04-08 DIAGNOSIS — I739 Peripheral vascular disease, unspecified: Secondary | ICD-10-CM

## 2013-04-08 HISTORY — DX: Type 2 diabetes mellitus with diabetic peripheral angiopathy without gangrene: E11.51

## 2013-04-08 MED ORDER — OXYMORPHONE HCL 10 MG PO TABS: ORAL_TABLET | ORAL | Status: DC

## 2013-04-08 MED ORDER — LOSARTAN POTASSIUM 50 MG PO TABS: ORAL_TABLET | ORAL | Status: DC

## 2013-04-08 MED ORDER — NITROGLYCERIN 2 % TD OINT: TOPICAL_OINTMENT | TRANSDERMAL | Status: DC

## 2013-04-08 NOTE — Progress Notes (Signed)
Subjective:    Patient ID: Austin Barnes, male    DOB: May 06, 1951, 62 y.o.   MRN: [id number]  HPI Says he has fevers to 103 about 6 weeks ago. Has not used antibiotics since this started  BP elevated. 190/90 at drugstore. Has been using minoxidil.  Estranged from his family.  Pain. Wearing brace on the right knee. Using walker with 2 wheels and skids. Hips start cramping.   Has been using Axiron, but it is creating a rash and he thinks he should be on injections.  He wants a new cardiovascular Careers adviser.  Pharmacist suggested Opana instead of oxycodone. It has lost its effectiveness.  Had infection in his foot. He called for antibiotic in April 2014. Got Cipro.  He says his short term memory is getting worse.  Has some stomach pain and diarrhea.  History of hyperglycemia. Has not been checked recently.  Current Outpatient Prescriptions on File Prior to Visit  Medication Sig Dispense Refill  . carisoprodol (SOMA) 350 MG tablet Take one tablet 4 times a day as needed for muscle spasm  120 tablet  5  . diazepam (VALIUM) 10 MG tablet Take one tablet up to four times daily to relax muscles.  120 tablet  1  . HYDROcodone-acetaminophen (HYCET) 7.5-325 mg/15 ml solution Mix with 480cc guafenesin  100mg /78mL and 240 mL chlorpheniramine 8mg /5cc 1 -2 tsp up to 4 times a day.  480 mL  0  . levothyroxine (SYNTHROID, LEVOTHROID) 50 MCG tablet Take 1 tablet every day for thyroid supplement  30 tablet  5  . methadone (DOLOPHINE) 10 MG tablet Take four tablets every 6 hours to control pain  480 tablet  0  . minoxidil (LONITEN) 10 MG tablet Take one tablet by mouth daily to control blood pressure  60 tablet  5  . NITRO-BID 2 % ointment APPLY 1 INCH STRIP TO FEET TWICE DAILY.  60 g  1  . oxyCODONE (OXY IR/ROXICODONE) 5 MG immediate release tablet Take four tablets every 6 hours as needed for pain.  480 tablet  0  . oxyCODONE (OXY IR/ROXICODONE) 5 MG immediate release tablet Take four tablets every 6  hours as needed for pain  480 tablet  0  . SF 5000 PLUS 1.1 % CREA dental cream BRUSH ON TEETH FOR 2 MINUTES THEN SWISH AND EXPECTORATE.  51 g  3  . temazepam (RESTORIL) 15 MG capsule Take 15 mg by mouth at bedtime as needed.      . Testosterone 20.25 MG/1.25GM (1.62%) GEL Place 2 application onto the skin once.  75 g  3  . traMADol (ULTRAM) 50 MG tablet TAKE 1 TABLET UP TO FOUR TIMES DAILY AS NEEDED FOR PAIN.  120 tablet  5  . VOLTAREN 1 % GEL APPLY 2-4GM TO AFFECTED AREA UP TO 4 TIMES A DAY  300 g  3   No current facility-administered medications on file prior to visit.    Review of Systems  Constitutional: Positive for activity change. Negative for appetite change.  HENT: Positive for congestion, rhinorrhea and postnasal drip.   Eyes: Positive for visual disturbance.  Respiratory: Positive for cough and shortness of breath.        Producing sputum  Cardiovascular: Negative for chest pain, palpitations and leg swelling.  Gastrointestinal:       Loose stools  Endocrine: Positive for polydipsia.  Musculoskeletal: Positive for arthralgias, back pain, gait problem, joint swelling (Right knee), myalgias, neck pain and neck stiffness.  Skin: Negative.  Allergic/Immunologic: Negative.   Neurological: Positive for dizziness, weakness and numbness.  Hematological:       History of anemia.  Psychiatric/Behavioral: Positive for decreased concentration. Negative for suicidal ideas, behavioral problems and confusion. The patient is nervous/anxious.        Objective:BP 172/100  Pulse 116  Temp(Src) 100.6 F (38.1 C) (Oral)  Ht 5' 9.5" (1.765 m)  Wt 212 lb (96.163 kg)  BMI 30.87 kg/m2  SpO2 98%    Physical Exam  Vitals reviewed. Constitutional: He is oriented to person, place, and time.  Mesomorphic. Rambling in thought content.  HENT:  Head: Normocephalic and atraumatic.  Right Ear: External ear normal.  Left Ear: External ear normal.  Nose: Nose normal.  Mouth/Throat: Oropharynx  is clear and moist.  Eyes: Conjunctivae and EOM are normal. Pupils are equal, round, and reactive to light.  Neck: No JVD present. No tracheal deviation present. No thyromegaly present.  Cardiovascular: Normal rate, regular rhythm and normal heart sounds.  Exam reveals no gallop and no friction rub.   No murmur heard. Pulmonary/Chest: No respiratory distress. He has no wheezes. He has no rales.  Abdominal: He exhibits no distension and no mass. There is no tenderness.  Musculoskeletal: He exhibits edema and tenderness.  Muscular weakness and pains. Unstable gait. Uses walker. Velcro brace on the right knee.  Lymphadenopathy:    He has no cervical adenopathy.  Neurological: He is alert and oriented to person, place, and time. He displays abnormal reflex. No cranial nerve deficit. Coordination abnormal.  Skin: No rash noted. No erythema. No pallor.  Face is flushed.  Psychiatric:  Rambling speech. Frustrated and angry.    Office Visit on 04/08/2013  Component Date Value Range Status  . WBC 04/08/2013 7.1  3.4 - 10.8 x10E3/uL Final  . RBC 04/08/2013 4.22  4.14 - 5.80 x10E6/uL Final  . Hemoglobin 04/08/2013 12.8  12.6 - 17.7 g/dL Final  . HCT 16/04/9603 37.4* 37.5 - 51.0 % Final  . MCV 04/08/2013 89  79 - 97 fL Final  . MCH 04/08/2013 30.3  26.6 - 33.0 pg Final  . MCHC 04/08/2013 34.2  31.5 - 35.7 g/dL Final  . RDW 54/03/8118 13.4  12.3 - 15.4 % Final  . Neutrophils Relative % 04/08/2013 65   Final  . Lymphs 04/08/2013 25   Final  . Monocytes 04/08/2013 9   Final  . Eos 04/08/2013 1   Final  . Basos 04/08/2013 0   Final  . Neutrophils Absolute 04/08/2013 4.6  1.4 - 7.0 x10E3/uL Final  . Lymphocytes Absolute 04/08/2013 1.8  0.7 - 3.1 x10E3/uL Final  . Monocytes Absolute 04/08/2013 0.6  0.1 - 0.9 x10E3/uL Final  . Eosinophils Absolute 04/08/2013 0.1  0.0 - 0.4 x10E3/uL Final  . Basophils Absolute 04/08/2013 0.0  0.0 - 0.2 x10E3/uL Final  . Immature Granulocytes 04/08/2013 0   Final   . Immature Grans (Abs) 04/08/2013 0.0  0.0 - 0.1 x10E3/uL Final  . Testosterone 04/08/2013 299* 348 - 1197 ng/dL Final  . COMMENT 14/78/2956 Comment   Final   Comment: Adult male reference interval is based on a population of lean males                          up to 61 years old.  . Testosterone, Free 04/08/2013 4.0* 6.6 - 18.1 pg/mL Final  . Glucose 04/08/2013 110* 65 - 99 mg/dL Final  . BUN 21/30/8657 18  8 -  27 mg/dL Final  . Creatinine, Ser 04/08/2013 1.18  0.76 - 1.27 mg/dL Final  . GFR calc non Af Amer 04/08/2013 66  >59 mL/min/1.73 Final  . GFR calc Af Amer 04/08/2013 77  >59 mL/min/1.73 Final  . BUN/Creatinine Ratio 04/08/2013 15  10 - 22 Final  . Sodium 04/08/2013 140  134 - 144 mmol/L Final  . Potassium 04/08/2013 4.2  3.5 - 5.2 mmol/L Final  . Chloride 04/08/2013 99  97 - 108 mmol/L Final  . CO2 04/08/2013 24  18 - 29 mmol/L Final  . Calcium 04/08/2013 10.2  8.6 - 10.2 mg/dL Final  . Total Protein 04/08/2013 7.1  6.0 - 8.5 g/dL Final  . Albumin 81/19/1478 5.0* 3.6 - 4.8 g/dL Final  . Globulin, Total 04/08/2013 2.1  1.5 - 4.5 g/dL Final  . Albumin/Globulin Ratio 04/08/2013 2.4  1.1 - 2.5 Final  . Total Bilirubin 04/08/2013 0.2  0.0 - 1.2 mg/dL Final  . Alkaline Phosphatase 04/08/2013 98  39 - 117 IU/L Final  . AST 04/08/2013 23  0 - 40 IU/L Final  . ALT 04/08/2013 26  0 - 44 IU/L Final  . TSH 04/08/2013 2.400  0.450 - 4.500 uIU/mL Final  . Cholesterol, Total 04/08/2013 190  100 - 199 mg/dL Final  . Triglycerides 04/08/2013 206* 0 - 149 mg/dL Final  . HDL 29/56/2130 55  >39 mg/dL Final   Comment: According to ATP-III Guidelines, HDL-C >59 mg/dL is considered a                          negative risk factor for CHD.  Marland Kitchen VLDL Cholesterol Cal 04/08/2013 41* 5 - 40 mg/dL Final  . LDL Calculated 04/08/2013 94  0 - 99 mg/dL Final  . Chol/HDL Ratio 04/08/2013 3.5  0.0 - 5.0 ratio units Final  . Specific Gravity, UA 04/13/2013 1.026  1.005 - 1.030 Final  . pH, UA 04/13/2013 5.5   5.0 - 7.5 Final  . Color, UA 04/13/2013 Yellow  Yellow Final  . Appearance Ur 04/13/2013 Clear  Clear Final  . Leukocytes, UA 04/13/2013 Negative  Negative Final  . Protein, UA 04/13/2013 Negative  Negative/Trace Final  . Glucose, UA 04/13/2013 Negative  Negative Final  . Ketones, UA 04/13/2013 Negative  Negative Final  . RBC, UA 04/13/2013 Negative  Negative Final  . Bilirubin, UA 04/13/2013 Negative  Negative Final  . Urobilinogen, Ur 04/13/2013 0.2  0.0 - 1.9 mg/dL Final  . Nitrite, UA 86/57/8469 Negative  Negative Final  . Hemoglobin A1C 04/08/2013 5.9* 4.8 - 5.6 % Final   Comment:          Increased risk for diabetes: 5.7 - 6.4                                   Diabetes: >6.4                                   Glycemic control for adults with diabetes: <7.0  . Estimated average glucose 04/08/2013 123   Final  . Please note 04/08/2013 Comment   Final   Comment: The date and/or time of collection was not indicated on the  requisition as required by state and federal law.  The date of                          receipt of the specimen was used as the collection date if not                          supplied.         Assessment & Plan:  1. Arthritis  chronic and disabling. Main areas of involvement include his [place] and right knee.  2. Unspecified essential hypertension Blood pressure continues to run high. Added Cozaar. - Comprehensive metabolic panel - losartan (COZAAR) 50 MG tablet; One daily to control BP  Dispense: 30 tablet; Refill: 3  3. Hiatal hernia Asymptomatic  4. Peripheral vascular disease Chronic. Persistent. He would like a new vascular surgeon. There is no immediate reason for referral.  5. Reflux Related to hernia. Currently asymptomatic.  6. Anemia Needs followup.> Subsequent lab showed resolution.  7. Testosterone insufficiency Needs followup.> Subsequent lab showed continued low-dose testosterone at 299 - Testosterone,Free  and Total  8. Impotence Likely related to low testosterone and depression. Circulatory issues can compound the situation.  9. Other and unspecified hyperlipidemia Followup lab - Lipid panel  10. Chronic pain Continue with narcotics  11. Anemia, unspecified Recheck lab - CBC with Differential  12. Fever, unspecified No recent fevers. - TSH. 2.400> 2.400 - Urinalysis> normal. No infection.  13. Type II or unspecified type diabetes mellitus without mention of complication, not stated as uncontrolled Followup lab. No change in medications at this time. - Hemoglobin A1c> 5.9

## 2013-04-09 ENCOUNTER — Telehealth: Payer: Self-pay

## 2013-04-09 LAB — COMPREHENSIVE METABOLIC PANEL
ALT: 26 IU/L (ref 0–44)
AST: 23 IU/L (ref 0–40)
Albumin/Globulin Ratio: 2.4 (ref 1.1–2.5)
Alkaline Phosphatase: 98 IU/L (ref 39–117)
Calcium: 10.2 mg/dL (ref 8.6–10.2)
GFR calc Af Amer: 77 mL/min/{1.73_m2} (ref >59)
GFR calc non Af Amer: 66 mL/min/{1.73_m2} (ref >59)
Potassium: 4.2 mmol/L (ref 3.5–5.2)
Sodium: 140 mmol/L (ref 134–144)
Total Bilirubin: 0.2 mg/dL (ref 0.0–1.2)

## 2013-04-09 LAB — CBC WITH DIFFERENTIAL/PLATELET
Eos: 1 %
Immature Grans (Abs): 0 10*3/uL (ref 0.0–0.1)
Immature Granulocytes: 0 %
MCV: 89 fL (ref 79–97)
Monocytes Absolute: 0.6 10*3/uL (ref 0.1–0.9)
Neutrophils Relative %: 65 %
RDW: 13.4 % (ref 12.3–15.4)
WBC: 7.1 10*3/uL (ref 3.4–10.8)

## 2013-04-09 LAB — LIPID PANEL
Cholesterol, Total: 190 mg/dL (ref 100–199)
HDL: 55 mg/dL (ref >39)
LDL Calculated: 94 mg/dL (ref 0–99)
VLDL Cholesterol Cal: 41 mg/dL — ABNORMAL HIGH (ref 5–40)

## 2013-04-09 LAB — HEMOGLOBIN A1C: Hgb A1c MFr Bld: 5.9 % — ABNORMAL HIGH (ref 4.8–5.6)

## 2013-04-09 LAB — TESTOSTERONE,FREE AND TOTAL: Testosterone, Free: 4 pg/mL — ABNORMAL LOW (ref 6.6–18.1)

## 2013-04-09 LAB — PLEASE NOTE

## 2013-04-09 NOTE — Telephone Encounter (Signed)
Message copied by Maurice Small on Thu Apr 09, 2013 11:04 AM ------      Message from: Kimber Relic      Created: Thu Apr 09, 2013  9:43 AM       Call patient. Lab is ok except for a mildly elevated glucose and a slightly low testosterone level. Mail him a copy if he wants it. ------

## 2013-04-09 NOTE — Telephone Encounter (Signed)
Discussed labs with patient, patient verbalized understanding of labs. Patient indicated that he will have someone drop off urine sample today that he was unable to do at the time of his OV. Patient questioned what is causing his fever, patient was informed that the sooner we can check his u/a then we can move forward with determining cause of fever.

## 2013-04-13 ENCOUNTER — Other Ambulatory Visit: Payer: Self-pay | Admitting: *Deleted

## 2013-04-13 DIAGNOSIS — R509 Fever, unspecified: Secondary | ICD-10-CM | POA: Diagnosis not present

## 2013-04-13 MED ORDER — HYDROCODONE-ACETAMINOPHEN 7.5-325 MG/15ML PO SOLN: ORAL | Status: DC

## 2013-04-14 LAB — URINALYSIS
Glucose, UA: NEGATIVE
Ketones, UA: NEGATIVE
Leukocytes, UA: NEGATIVE
Protein, UA: NEGATIVE
RBC, UA: NEGATIVE

## 2013-04-15 ENCOUNTER — Other Ambulatory Visit: Payer: Self-pay | Admitting: *Deleted

## 2013-04-16 ENCOUNTER — Other Ambulatory Visit: Payer: Self-pay | Admitting: *Deleted

## 2013-04-16 MED ORDER — SODIUM FLUORIDE 1.1 % DT CREA: TOPICAL_CREAM | DENTAL | Status: DC

## 2013-04-16 MED ORDER — TESTOSTERONE 20.25 MG/1.25GM (1.62%) TD GEL: 2.0000 "application " | Freq: Once | TRANSDERMAL | Status: DC

## 2013-04-24 ENCOUNTER — Other Ambulatory Visit: Payer: Self-pay | Admitting: *Deleted

## 2013-04-24 MED ORDER — TRAMADOL HCL 50 MG PO TABS: ORAL_TABLET | ORAL | Status: DC

## 2013-04-29 ENCOUNTER — Other Ambulatory Visit: Payer: Self-pay | Admitting: *Deleted

## 2013-04-29 MED ORDER — HYDROCODONE-ACETAMINOPHEN 7.5-325 MG/15ML PO SOLN: ORAL | Status: DC

## 2013-04-29 MED ORDER — METHADONE HCL 10 MG PO TABS: ORAL_TABLET | ORAL | Status: DC

## 2013-04-29 MED ORDER — OXYCODONE HCL 5 MG PO TABS: ORAL_TABLET | ORAL | Status: DC

## 2013-04-29 NOTE — Telephone Encounter (Signed)
Patient calling wanting his narcotics filled.  Hydroc/APAP Soln was filled on 04/13/2013, Methadone was last filled on 04/02/2013 and on 04/08/2013 Dr. Chilton Si changed patient to Opana 10mg , patient states he cannot afford this so the drugstore is requesting old Rx for Oxy 5mg   Gave message to Dr. Chilton Si and he Ok'ed to print Rx's.

## 2013-05-12 ENCOUNTER — Other Ambulatory Visit: Payer: Self-pay | Admitting: Internal Medicine

## 2013-05-15 ENCOUNTER — Other Ambulatory Visit: Payer: Self-pay

## 2013-05-15 MED ORDER — TEMAZEPAM 15 MG PO CAPS: 15.0000 mg | ORAL_CAPSULE | Freq: Every evening | ORAL | Status: DC | PRN

## 2013-05-15 NOTE — Telephone Encounter (Signed)
RX manually faxed to [facility]

## 2013-05-26 ENCOUNTER — Other Ambulatory Visit: Payer: Self-pay | Admitting: Internal Medicine

## 2013-05-26 ENCOUNTER — Other Ambulatory Visit: Payer: Self-pay | Admitting: *Deleted

## 2013-05-26 MED ORDER — DIAZEPAM 10 MG PO TABS: ORAL_TABLET | ORAL | Status: DC

## 2013-05-28 ENCOUNTER — Other Ambulatory Visit: Payer: Self-pay | Admitting: *Deleted

## 2013-05-28 MED ORDER — METHADONE HCL 10 MG PO TABS: ORAL_TABLET | ORAL | Status: DC

## 2013-05-28 MED ORDER — HYDROCODONE-ACETAMINOPHEN 7.5-325 MG/15ML PO SOLN: ORAL | Status: DC

## 2013-05-28 MED ORDER — OXYCODONE HCL 5 MG PO TABS: ORAL_TABLET | ORAL | Status: DC

## 2013-05-30 NOTE — Patient Instructions (Signed)
Add Cozaar to medications

## 2013-06-08 ENCOUNTER — Telehealth: Payer: Self-pay

## 2013-06-08 NOTE — Telephone Encounter (Signed)
Patient called has knot on hands, not doing well, weak, pain bottom of feet, feet dark. Could not get the new pain medication Dr. Chilton Si wrote for him $500.00 for 30, he brought the Rx back to office. Has appt 06/23/13 with Dr. Chilton Si wonders if he could Dr. Chilton Si sooner. Dr. Chilton Si is out of the office this week, no openings as of today. Wants a different pain Rx, will have to wait until he sees Dr. Chilton Si. Wants to go to different Vascular Surgeon too.

## 2013-06-09 ENCOUNTER — Telehealth: Payer: Self-pay

## 2013-06-09 NOTE — Telephone Encounter (Signed)
Per message received from [facility]- patient is requesting increased dosage of Temazepam. Currently taking 15 mg, would like to increase to 30 (note states patient was originally on 30 mg)   Dr.Green is out of the office, will send to covering MD

## 2013-06-09 NOTE — Telephone Encounter (Signed)
Ok to provide 30 mg daily at bedtime until seen by Dr Chilton Si next

## 2013-06-10 MED ORDER — TEMAZEPAM 30 MG PO CAPS: 30.0000 mg | ORAL_CAPSULE | Freq: Every evening | ORAL | Status: DC | PRN

## 2013-06-10 NOTE — Telephone Encounter (Signed)
Rx sent for 30 mg tab # 30 with no refills. Patient with pending appointment 06/2013 to see Dr.Green

## 2013-06-10 NOTE — Addendum Note (Signed)
Addended by: Maurice Small on: 06/10/2013 08:15 AM   Modules accepted: Orders

## 2013-06-12 ENCOUNTER — Other Ambulatory Visit: Payer: Self-pay | Admitting: Internal Medicine

## 2013-06-15 ENCOUNTER — Other Ambulatory Visit: Payer: Self-pay | Admitting: *Deleted

## 2013-06-17 ENCOUNTER — Telehealth: Payer: Self-pay | Admitting: *Deleted

## 2013-06-17 ENCOUNTER — Other Ambulatory Visit: Payer: Self-pay | Admitting: *Deleted

## 2013-06-17 MED ORDER — HYDROCODONE-ACETAMINOPHEN 7.5-325 MG/15ML PO SOLN: ORAL | Status: DC

## 2013-06-17 NOTE — Telephone Encounter (Signed)
Patient called wanting his Hydrocodone mix refilled, but patient just got it refilled on 05/28/2013. Tried to explain to patient that it was too early and patient argued with me stating that he has had to take more of it this month due to cough and congestion. Patient has an appointment next Wednesday. Patient is telling me there is a lot of stuff in his chart that is not true. Patient stated that the diagnosis of Impotence was not him it was his ex wife.

## 2013-06-17 NOTE — Addendum Note (Signed)
Addended by: Melina Modena F on: 06/17/2013 02:35 PM   Modules accepted: Orders, Medications

## 2013-06-17 NOTE — Addendum Note (Signed)
Addended by: Melina Modena F on: 06/17/2013 02:10 PM   Modules accepted: Orders

## 2013-06-18 ENCOUNTER — Encounter: Payer: Self-pay | Admitting: Internal Medicine

## 2013-06-23 ENCOUNTER — Other Ambulatory Visit: Payer: Self-pay | Admitting: Internal Medicine

## 2013-06-23 ENCOUNTER — Ambulatory Visit (INDEPENDENT_AMBULATORY_CARE_PROVIDER_SITE_OTHER): Payer: Medicare Other | Admitting: Internal Medicine

## 2013-06-23 ENCOUNTER — Encounter: Payer: Self-pay | Admitting: Internal Medicine

## 2013-06-23 VITALS — BP 208/80 | HR 80 | Temp 98.2°F | Resp 16 | Ht 70.5 in | Wt 212.0 lb

## 2013-06-23 DIAGNOSIS — G8929 Other chronic pain: Secondary | ICD-10-CM | POA: Diagnosis not present

## 2013-06-23 DIAGNOSIS — I10 Essential (primary) hypertension: Secondary | ICD-10-CM | POA: Diagnosis not present

## 2013-06-23 DIAGNOSIS — E119 Type 2 diabetes mellitus without complications: Secondary | ICD-10-CM

## 2013-06-23 DIAGNOSIS — Z23 Encounter for immunization: Secondary | ICD-10-CM | POA: Diagnosis not present

## 2013-06-23 DIAGNOSIS — I739 Peripheral vascular disease, unspecified: Secondary | ICD-10-CM

## 2013-06-23 DIAGNOSIS — M6740 Ganglion, unspecified site: Secondary | ICD-10-CM

## 2013-06-23 MED ORDER — METHADONE HCL 10 MG PO TABS: ORAL_TABLET | ORAL | Status: DC

## 2013-06-23 MED ORDER — OXYCODONE HCL 5 MG PO TABS: ORAL_TABLET | ORAL | Status: DC

## 2013-06-23 NOTE — Patient Instructions (Addendum)
Get losartan and use it. Call if the BP does not respond. SBP <140, DBP < 90.

## 2013-06-23 NOTE — Progress Notes (Signed)
Subjective:    Patient ID: Austin Barnes, male    DOB: [birth year], 62 y.o.   MRN: [id number]  Chief Complaint  Patient presents with  . Medical Managment of Chronic Issues    2 month f/u   . Immunizations    needs Flu vaccine, Td <10 yrs  . other    declines colonoscopy,     HPI Headache today. He has related thisto arachnoid irritation.  Continues with difficulty swallowing. 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  Need for prophylactic vaccination and inoculation against influenza  Type II or unspecified type diabetes mellitus without mention of complication, not stated as uncontrolled: Lab work done in September 2014 showed glucose 110.  Unspecified essential hypertension: still high. Has not refilled losartan.  Peripheral vascular disease: unchanged. Asking for referral to [facility] vascular clinic  Chronic pain: unchanged. He did not get the Opana because of the cost. He is asking for increased doses of pain medications. Continues with pain he believes is secondary to his chronic arachnoiditis, right knee and right hip arthritis, and headaches.  Ganglion cyst: left wrist dorsum. Small and painless.    Current Outpatient Prescriptions on File Prior to Visit  Medication Sig Dispense Refill  . carisoprodol (SOMA) 350 MG tablet Take one tablet 4 times a day as needed for muscle spasm  120 tablet  5  . ciprofloxacin (CIPRO) 500 MG tablet       . diazepam (VALIUM) 10 MG tablet Take one tablet up to four times daily to relax muscles.  120 tablet  1  . Diphenhyd-Hydrocort-Nystatin SUSP Swish ans Swallow 5cc four times a day as needed      . HYDROcodone-acetaminophen (HYCET) 7.5-325 mg/15 ml solution Mix Hycet with 960cc guafenesin  100mg /68mL and 480 mL chlorpheniramine 8mg /5cc 1 -2 tsp up to 4 times a day.  1906 mL  0  . levothyroxine (SYNTHROID, LEVOTHROID) 50 MCG tablet Take 1 tablet every day for thyroid supplement  30 tablet  5  .  methadone (DOLOPHINE) 10 MG tablet Take four tablets every 6 hours to control pain  480 tablet  0  . minoxidil (LONITEN) 10 MG tablet Take one tablet by mouth daily to control blood pressure  60 tablet  5  . NITRO-BID 2 % ointment APPLY 1 INCH STRIP TO FEET TWICE DAILY.  60 g  1  . omeprazole (PRILOSEC) 20 MG capsule TAKE (2) CAPSULES TWICE DAILY.  60 capsule  6  . oxyCODONE (ROXICODONE) 5 MG immediate release tablet Take four tablets every 6 hours as needed for pain  480 tablet  0  . temazepam (RESTORIL) 30 MG capsule Take 1 capsule (30 mg total) by mouth at bedtime as needed for sleep.  30 capsule  0  . Testosterone 20.25 MG/1.25GM (1.62%) GEL Place 2 application onto the skin once.  75 g  3  . traMADol (ULTRAM) 50 MG tablet Take one tablet up to four times daily as needed for pain  120 tablet  5  . VOLTAREN 1 % GEL APPLY 2-4GM TO AFFECTED AREA UP TO 4 TIMES A DAY  300 g  3  . losartan (COZAAR) 50 MG tablet One daily to control BP  30 tablet  3   No current facility-administered medications on file prior to visit.    Review of Systems  Constitutional: Positive for activity change. Negative for appetite change.  HENT: Positive for congestion, postnasal drip and rhinorrhea.   Eyes:  Positive for visual disturbance.  Respiratory: Positive for cough and shortness of breath.        Producing sputum  Cardiovascular: Negative for chest pain, palpitations and leg swelling.  Gastrointestinal:       Loose stools  Endocrine: Positive for polydipsia.  Musculoskeletal: Positive for arthralgias, back pain, gait problem, joint swelling (Right knee), myalgias, neck pain and neck stiffness.  Skin: Negative.   Allergic/Immunologic: Negative.   Neurological: Positive for dizziness, weakness and numbness.  Hematological:       History of anemia.  Psychiatric/Behavioral: Positive for decreased concentration. Negative for suicidal ideas, behavioral problems and confusion. The patient is nervous/anxious.         Objective:BP 208/80  Pulse 80  Temp(Src) 98.2 F (36.8 C) (Oral)  Resp 16  Ht 5' 10.5" (1.791 m)  Wt 212 lb (96.163 kg)  BMI 29.98 kg/m2  SpO2 98%    Physical Exam  Vitals reviewed. Constitutional: He is oriented to person, place, and time.  Mesomorphic. Rambling in thought content.  HENT:  Head: Normocephalic and atraumatic.  Right Ear: External ear normal.  Left Ear: External ear normal.  Nose: Nose normal.  Mouth/Throat: Oropharynx is clear and moist.  Eyes: Conjunctivae and EOM are normal. Pupils are equal, round, and reactive to light.  Neck: No JVD present. No tracheal deviation present. No thyromegaly present.  Cardiovascular: Normal rate, regular rhythm and normal heart sounds.  Exam reveals no gallop and no friction rub.   No murmur heard. Pulmonary/Chest: No respiratory distress. He has no wheezes. He has no rales.  Abdominal: He exhibits no distension and no mass. There is no tenderness.  Musculoskeletal: He exhibits edema and tenderness.  Muscular weakness and pains. Unstable gait. Uses walker. Velcro brace on the right knee. Ganglion cyst left wrist dorsum.  Lymphadenopathy:    He has no cervical adenopathy.  Neurological: He is alert and oriented to person, place, and time. He displays abnormal reflex. No cranial nerve deficit. Coordination abnormal.  Skin: No rash noted. No erythema. No pallor.  Psychiatric:  Rambling speech. Frustrated and angry.     Office Visit on 04/08/2013  Component Date Value Range Status  . WBC 04/08/2013 7.1  3.4 - 10.8 x10E3/uL Final  . RBC 04/08/2013 4.22  4.14 - 5.80 x10E6/uL Final  . Hemoglobin 04/08/2013 12.8  12.6 - 17.7 g/dL Final  . HCT 16/04/9603 37.4* 37.5 - 51.0 % Final  . MCV 04/08/2013 89  79 - 97 fL Final  . MCH 04/08/2013 30.3  26.6 - 33.0 pg Final  . MCHC 04/08/2013 34.2  31.5 - 35.7 g/dL Final  . RDW 54/03/8118 13.4  12.3 - 15.4 % Final  . Neutrophils Relative % 04/08/2013 65   Final  . Lymphs  04/08/2013 25   Final  . Monocytes 04/08/2013 9   Final  . Eos 04/08/2013 1   Final  . Basos 04/08/2013 0   Final  . Neutrophils Absolute 04/08/2013 4.6  1.4 - 7.0 x10E3/uL Final  . Lymphocytes Absolute 04/08/2013 1.8  0.7 - 3.1 x10E3/uL Final  . Monocytes Absolute 04/08/2013 0.6  0.1 - 0.9 x10E3/uL Final  . Eosinophils Absolute 04/08/2013 0.1  0.0 - 0.4 x10E3/uL Final  . Basophils Absolute 04/08/2013 0.0  0.0 - 0.2 x10E3/uL Final  . Immature Granulocytes 04/08/2013 0   Final  . Immature Grans (Abs) 04/08/2013 0.0  0.0 - 0.1 x10E3/uL Final  . Testosterone 04/08/2013 299* 348 - 1197 ng/dL Final  . COMMENT 14/78/2956 Comment  Final   Comment: Adult male reference interval is based on a population of lean males                          up to 61 years old.  . Testosterone, Free 04/08/2013 4.0* 6.6 - 18.1 pg/mL Final  . Glucose 04/08/2013 110* 65 - 99 mg/dL Final  . BUN 16/04/9603 18  8 - 27 mg/dL Final  . Creatinine, Ser 04/08/2013 1.18  0.76 - 1.27 mg/dL Final  . GFR calc non Af Amer 04/08/2013 66  >59 mL/min/1.73 Final  . GFR calc Af Amer 04/08/2013 77  >59 mL/min/1.73 Final  . BUN/Creatinine Ratio 04/08/2013 15  10 - 22 Final  . Sodium 04/08/2013 140  134 - 144 mmol/L Final  . Potassium 04/08/2013 4.2  3.5 - 5.2 mmol/L Final  . Chloride 04/08/2013 99  97 - 108 mmol/L Final  . CO2 04/08/2013 24  18 - 29 mmol/L Final  . Calcium 04/08/2013 10.2  8.6 - 10.2 mg/dL Final  . Total Protein 04/08/2013 7.1  6.0 - 8.5 g/dL Final  . Albumin 54/03/8118 5.0* 3.6 - 4.8 g/dL Final  . Globulin, Total 04/08/2013 2.1  1.5 - 4.5 g/dL Final  . Albumin/Globulin Ratio 04/08/2013 2.4  1.1 - 2.5 Final  . Total Bilirubin 04/08/2013 0.2  0.0 - 1.2 mg/dL Final  . Alkaline Phosphatase 04/08/2013 98  39 - 117 IU/L Final  . AST 04/08/2013 23  0 - 40 IU/L Final  . ALT 04/08/2013 26  0 - 44 IU/L Final  . TSH 04/08/2013 2.400  0.450 - 4.500 uIU/mL Final  . Cholesterol, Total 04/08/2013 190  100 - 199 mg/dL Final   . Triglycerides 04/08/2013 206* 0 - 149 mg/dL Final  . HDL 14/78/2956 55  >39 mg/dL Final   Comment: According to ATP-III Guidelines, HDL-C >59 mg/dL is considered a                          negative risk factor for CHD.  Marland Kitchen VLDL Cholesterol Cal 04/08/2013 41* 5 - 40 mg/dL Final  . LDL Calculated 04/08/2013 94  0 - 99 mg/dL Final  . Chol/HDL Ratio 04/08/2013 3.5  0.0 - 5.0 ratio units Final  . Specific Gravity, UA 04/13/2013 1.026  1.005 - 1.030 Final  . pH, UA 04/13/2013 5.5  5.0 - 7.5 Final  . Color, UA 04/13/2013 Yellow  Yellow Final  . Appearance Ur 04/13/2013 Clear  Clear Final  . Leukocytes, UA 04/13/2013 Negative  Negative Final  . Protein, UA 04/13/2013 Negative  Negative/Trace Final  . Glucose, UA 04/13/2013 Negative  Negative Final  . Ketones, UA 04/13/2013 Negative  Negative Final  . RBC, UA 04/13/2013 Negative  Negative Final  . Bilirubin, UA 04/13/2013 Negative  Negative Final  . Urobilinogen, Ur 04/13/2013 0.2  0.0 - 1.9 mg/dL Final  . Nitrite, UA 21/30/8657 Negative  Negative Final  . Hemoglobin A1C 04/08/2013 5.9* 4.8 - 5.6 % Final   Comment:          Increased risk for diabetes: 5.7 - 6.4                                   Diabetes: >6.4  Glycemic control for adults with diabetes: <7.0  . Estimated average glucose 04/08/2013 123   Final  . Please note 04/08/2013 Comment   Final   Comment: The date and/or time of collection was not indicated on the                          requisition as required by state and federal law.  The date of                          receipt of the specimen was used as the collection date if not                          supplied.        Assessment & Plan:  Need for prophylactic vaccination and inoculation against influenza: administered  Type II or unspecified type diabetes mellitus without mention of complication, not stated as uncontrolled: unchanged  Unspecified essential hypertension: uncontrolled.  Patient is strongly advised to take medications as recommended. He is to refill the losartan and and it to the minoxidil. If blood pressure does not reach goal of SBP less than 140 and DBP less than 90, he is to call.  Peripheral vascular disease: unchanged. Requested referral to [facility].  Chronic pain: unchanged. Opana is too expensive. Remains on oxycodone and methadone and tramadol. Uses the tramadol for muscular cramps in his legs.  Ganglion cyst: benign. No surgery indicated.

## 2013-06-30 ENCOUNTER — Other Ambulatory Visit: Payer: Self-pay | Admitting: Internal Medicine

## 2013-07-07 ENCOUNTER — Other Ambulatory Visit: Payer: Self-pay | Admitting: Internal Medicine

## 2013-07-10 ENCOUNTER — Other Ambulatory Visit: Payer: Self-pay | Admitting: Internal Medicine

## 2013-07-13 ENCOUNTER — Other Ambulatory Visit: Payer: Self-pay | Admitting: *Deleted

## 2013-07-13 ENCOUNTER — Other Ambulatory Visit: Payer: Self-pay

## 2013-07-13 MED ORDER — TEMAZEPAM 30 MG PO CAPS: ORAL_CAPSULE | ORAL | Status: DC

## 2013-07-13 MED ORDER — HYDROCODONE-ACETAMINOPHEN 7.5-325 MG/15ML PO SOLN: ORAL | Status: DC

## 2013-07-15 ENCOUNTER — Other Ambulatory Visit: Payer: Self-pay | Admitting: *Deleted

## 2013-07-15 MED ORDER — DIAZEPAM 10 MG PO TABS: ORAL_TABLET | ORAL | Status: DC

## 2013-07-22 ENCOUNTER — Telehealth: Payer: Self-pay | Admitting: *Deleted

## 2013-07-22 MED ORDER — METHADONE HCL 10 MG PO TABS: ORAL_TABLET | ORAL | Status: DC

## 2013-07-22 MED ORDER — OXYCODONE HCL 5 MG PO TABS: ORAL_TABLET | ORAL | Status: DC

## 2013-07-22 NOTE — Telephone Encounter (Signed)
rx reprinted due to needing to be on RX paper. Waiting on signature, then will call pt to advise that RX is ready for pick up. Pt notified VIA phone and will come to pick up prescription.

## 2013-08-17 ENCOUNTER — Other Ambulatory Visit: Payer: Self-pay | Admitting: *Deleted

## 2013-08-17 MED ORDER — TEMAZEPAM 30 MG PO CAPS: ORAL_CAPSULE | ORAL | Status: DC

## 2013-08-17 MED ORDER — HYDROCODONE-ACETAMINOPHEN 7.5-325 MG/15ML PO SOLN: ORAL | Status: DC

## 2013-08-17 MED ORDER — TESTOSTERONE 20.25 MG/1.25GM (1.62%) TD GEL: 2.0000 "application " | Freq: Once | TRANSDERMAL | Status: DC

## 2013-08-17 NOTE — Telephone Encounter (Signed)
[facility]. Rx's printed for patient to pick up

## 2013-08-18 ENCOUNTER — Ambulatory Visit: Payer: Medicare Other | Admitting: Cardiovascular Disease

## 2013-08-20 ENCOUNTER — Encounter: Payer: Self-pay | Admitting: Cardiovascular Disease

## 2013-08-21 ENCOUNTER — Other Ambulatory Visit: Payer: Self-pay | Admitting: *Deleted

## 2013-08-21 ENCOUNTER — Encounter: Payer: Self-pay | Admitting: Cardiovascular Disease

## 2013-08-21 ENCOUNTER — Ambulatory Visit (INDEPENDENT_AMBULATORY_CARE_PROVIDER_SITE_OTHER): Payer: Medicare Other | Admitting: Cardiovascular Disease

## 2013-08-21 VITALS — BP 142/98 | HR 95 | Ht 69.0 in | Wt 218.5 lb

## 2013-08-21 DIAGNOSIS — I739 Peripheral vascular disease, unspecified: Secondary | ICD-10-CM | POA: Diagnosis not present

## 2013-08-21 DIAGNOSIS — R079 Chest pain, unspecified: Secondary | ICD-10-CM | POA: Diagnosis not present

## 2013-08-21 DIAGNOSIS — G8929 Other chronic pain: Secondary | ICD-10-CM

## 2013-08-21 MED ORDER — METHADONE HCL 10 MG PO TABS: ORAL_TABLET | ORAL | Status: DC

## 2013-08-21 MED ORDER — OXYCODONE HCL 5 MG PO TABS: ORAL_TABLET | ORAL | Status: DC

## 2013-08-21 NOTE — Patient Instructions (Signed)
Follow up with Dr Berry has needed.  

## 2013-08-21 NOTE — Progress Notes (Signed)
08/21/2013 Austin Barnes   [birth year]  [id number]  Primary Physician GREEN, Lenon CurtARTHUR G, MD Primary Cardiologist: Runell GessJonathan J. Sarahlynn Cisnero MD Roseanne RenoFACP,FACC,FAHA, FSCAI   HPI:  [name] is an unfortunate 63 year old moderately overweight divorced Caucasian male, disabled. Self-referred for evaluation of peripheral arterial disease. He is seeing [name], vascular surgeon, in the past who did an arteriogram at about 12 revealing triple-vessel disease thought not to be revascularizable. Has had gangrenous toes in the past. So the problems include tobacco abuse but currently does not smoke. He's also had hypercholesterolemia, hypertension and chronic pancreatitis. After reviewing the angiograms personally do not feel that he has any rebound physician options I recommend conservative therapy.   Current Outpatient Prescriptions  Medication Sig Dispense Refill  . carisoprodol (SOMA) 350 MG tablet Take one tablet 4 times a day as needed for muscle spasm  120 tablet  5  . diazepam (VALIUM) 10 MG tablet Take one tablet up to four times daily to relax muscles.  120 tablet  1  . Diphenhyd-Hydrocort-Nystatin SUSP Swish ans Swallow 5cc four times a day as needed      . HYDROcodone-acetaminophen (HYCET) 7.5-325 mg/15 ml solution Mix Hycet 946ml with 960cc guafenesin  100mg /635mL and 480 mL chlorpheniramine 8mg /5cc 1 -2 tsp up to 4 times a day.  1906 mL  0  . levothyroxine (SYNTHROID, LEVOTHROID) 50 MCG tablet TAKE 1 TABLET EACH DAY FOR THYROID.  60 tablet  5  . losartan (COZAAR) 50 MG tablet One daily to control BP  30 tablet  3  . methadone (DOLOPHINE) 10 MG tablet Take four tablets every 6 hours to control pain  480 tablet  0  . minoxidil (LONITEN) 10 MG tablet Take one tablet by mouth daily to control blood pressure  60 tablet  5  . NITRO-BID 2 % ointment APPLY 1 INCH STRIP TO FEET TWICE DAILY.  60 g  1  . omeprazole (PRILOSEC) 20 MG capsule TAKE (2) CAPSULES TWICE DAILY.  60 capsule  6  . oxyCODONE  (ROXICODONE) 5 MG immediate release tablet Take four tablets every 6 hours as needed for pain  480 tablet  0  . SF 5000 PLUS 1.1 % CREA dental cream BRUSH ON TEETH FOR 2 MINUTES THEN SWISH AND EXPECTORATE.  51 g  5  . temazepam (RESTORIL) 30 MG capsule Take one capsule at bedtime as needed for rest  30 capsule  0  . Testosterone 20.25 MG/1.25GM (1.62%) GEL Place 2 application onto the skin once.  75 g  1  . traMADol (ULTRAM) 50 MG tablet Take one tablet up to four times daily as needed for pain  120 tablet  5  . VOLTAREN 1 % GEL APPLY 2-4GM TO AFFECTED AREA UP TO 4 TIMES A DAY  300 g  1   No current facility-administered medications for this visit.    Allergies  Allergen Reactions  . Codeine   . Demerol   . Disalcid [Salsalate]   . Feldene [Piroxicam]   . Penicillins   . Sulfa Antibiotics     History   Social History  . Marital Status: Single    Spouse Name: N/A    Number of Children: N/A  . Years of Education: N/A   Occupational History  . Not on file.   Social History Main Topics  . Smoking status: Former Smoker -- 0.25 packs/day for 3 years    Types: Cigarettes  . Smokeless tobacco: Former Careers information officerUser    Quit  date: 07/17/2007  . Alcohol Use: No  . Drug Use: No  . Sexual Activity:    Other Topics Concern  . Not on file   Social History Narrative  . No narrative on file     Review of Systems: General: negative for chills, fever, night sweats or weight changes.  Cardiovascular: negative for chest pain, dyspnea on exertion, edema, orthopnea, palpitations, paroxysmal nocturnal dyspnea or shortness of breath Dermatological: negative for rash Respiratory: negative for cough or wheezing Urologic: negative for hematuria Abdominal: negative for nausea, vomiting, diarrhea, bright red blood per rectum, melena, or hematemesis Neurologic: negative for visual changes, syncope, or dizziness All other systems reviewed and are otherwise negative except as noted above.    Blood  pressure 142/98, pulse 95, height 5\' 9"  (1.753 m), weight 218 lb 8 oz (99.111 kg).  General appearance: alert and no distress Neck: no adenopathy, no carotid bruit, no JVD, supple, symmetrical, trachea midline and thyroid not enlarged, symmetric, no tenderness/mass/nodules Lungs: clear to auscultation bilaterally Heart: regular rate and rhythm, S1, S2 normal, no murmur, click, rub or gallop Abdomen: soft, non-tender; bowel sounds normal; no masses,  no organomegaly Extremities: extremities normal, atraumatic, no cyanosis or edema and and pedal pulses  EKG not performed today  ASSESSMENT AND PLAN:   Peripheral vascular disease Patient was self-referred for evaluation of chronic extremity pain.he was seen Dr. Christophe Louis in the past. He had an arteriogram performed in 2011 that showed basically absent tibial vessels. After this and did not feel that there was a revascularization option. Comes here for second opinion. 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 He has chronic pain which sounds neuropathic. I reviewed his angiograms from 2011  and concur with Dr. Durwin Nora appears that there are no surgical or percutaneous options for revascularization.      Runell Gess MD FACP,FACC,FAHA, [facility] 08/21/2013 2:36 PM

## 2013-08-21 NOTE — Assessment & Plan Note (Addendum)
Patient was self-referred for evaluation of chronic extremity pain.he was seen Dr. Christophe Louishristensen in the past. He had an arteriogram performed in 2011 that showed basically absent tibial vessels. After this and did not feel that there was a revascularization option. Comes here for second opinion. 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 He has chronic pain which sounds neuropathic. I reviewed his angiograms from 2011  and concur with Dr. Durwin Noraixon appears that there are no surgical or percutaneous options for revascularization.

## 2013-08-27 ENCOUNTER — Other Ambulatory Visit: Payer: Self-pay | Admitting: *Deleted

## 2013-08-27 MED ORDER — CARISOPRODOL 350 MG PO TABS: ORAL_TABLET | ORAL | Status: DC

## 2013-09-07 ENCOUNTER — Other Ambulatory Visit: Payer: Self-pay | Admitting: *Deleted

## 2013-09-07 MED ORDER — DIAZEPAM 10 MG PO TABS: ORAL_TABLET | ORAL | Status: DC

## 2013-09-07 NOTE — Telephone Encounter (Signed)
[facility]

## 2013-09-11 ENCOUNTER — Other Ambulatory Visit: Payer: Self-pay | Admitting: *Deleted

## 2013-09-11 MED ORDER — HYDROCODONE-ACETAMINOPHEN 7.5-325 MG/15ML PO SOLN: ORAL | Status: DC

## 2013-09-16 ENCOUNTER — Other Ambulatory Visit: Payer: Self-pay | Admitting: *Deleted

## 2013-09-16 ENCOUNTER — Other Ambulatory Visit: Payer: Self-pay | Admitting: Internal Medicine

## 2013-09-16 DIAGNOSIS — G8929 Other chronic pain: Secondary | ICD-10-CM

## 2013-09-16 MED ORDER — OXYCODONE HCL 5 MG PO TABS: ORAL_TABLET | ORAL | Status: DC

## 2013-09-16 MED ORDER — HYDROCODONE-ACETAMINOPHEN 7.5-325 MG/15ML PO SOLN: ORAL | Status: DC

## 2013-09-16 MED ORDER — METHADONE HCL 10 MG PO TABS: ORAL_TABLET | ORAL | Status: DC

## 2013-09-16 NOTE — Telephone Encounter (Signed)
rx printed/signed and put up front for signature.

## 2013-09-16 NOTE — Telephone Encounter (Signed)
Reprinted the Hydrocodone (hycet) because patient wanted to pick up with his Methadone to get on same schedule. Destroyed old Rx.

## 2013-09-21 ENCOUNTER — Other Ambulatory Visit: Payer: Self-pay | Admitting: Internal Medicine

## 2013-09-24 ENCOUNTER — Other Ambulatory Visit: Payer: Self-pay

## 2013-09-24 ENCOUNTER — Other Ambulatory Visit: Payer: Self-pay | Admitting: *Deleted

## 2013-09-24 MED ORDER — TRAMADOL HCL 50 MG PO TABS: ORAL_TABLET | ORAL | Status: DC

## 2013-09-24 NOTE — Telephone Encounter (Signed)
Rx called in 

## 2013-09-30 ENCOUNTER — Ambulatory Visit: Payer: Self-pay | Admitting: Internal Medicine

## 2013-10-02 ENCOUNTER — Other Ambulatory Visit: Payer: Self-pay | Admitting: *Deleted

## 2013-10-02 MED ORDER — DIAZEPAM 10 MG PO TABS: ORAL_TABLET | ORAL | Status: DC

## 2013-10-02 NOTE — Telephone Encounter (Signed)
[facility]  

## 2013-10-09 ENCOUNTER — Other Ambulatory Visit: Payer: Self-pay | Admitting: Internal Medicine

## 2013-10-14 ENCOUNTER — Other Ambulatory Visit: Payer: Self-pay | Admitting: *Deleted

## 2013-10-14 DIAGNOSIS — G8929 Other chronic pain: Secondary | ICD-10-CM

## 2013-10-14 MED ORDER — HYDROCODONE-ACETAMINOPHEN 7.5-325 MG/15ML PO SOLN
ORAL | Status: DC
Start: 2013-10-14 — End: 2013-11-12

## 2013-10-14 MED ORDER — METHADONE HCL 10 MG PO TABS: ORAL_TABLET | ORAL | Status: DC

## 2013-10-14 MED ORDER — METHADONE HCL 10 MG PO TABS
ORAL_TABLET | ORAL | Status: DC
Start: 2013-10-14 — End: 2013-10-14

## 2013-10-14 MED ORDER — OXYCODONE HCL 5 MG PO TABS: ORAL_TABLET | ORAL | Status: DC

## 2013-10-14 MED ORDER — HYDROCODONE-ACETAMINOPHEN 7.5-325 MG/15ML PO SOLN: ORAL | Status: DC

## 2013-10-14 NOTE — Telephone Encounter (Signed)
Patient Requested 

## 2013-10-20 ENCOUNTER — Ambulatory Visit: Payer: Self-pay | Admitting: Internal Medicine

## 2013-10-23 ENCOUNTER — Other Ambulatory Visit: Payer: Self-pay | Admitting: Internal Medicine

## 2013-10-28 ENCOUNTER — Encounter: Payer: Self-pay | Admitting: *Deleted

## 2013-10-28 NOTE — Telephone Encounter (Signed)
error 

## 2013-10-29 ENCOUNTER — Other Ambulatory Visit: Payer: Self-pay | Admitting: *Deleted

## 2013-10-29 MED ORDER — DIAZEPAM 10 MG PO TABS: ORAL_TABLET | ORAL | Status: DC

## 2013-10-29 NOTE — Telephone Encounter (Signed)
[facility]  

## 2013-11-09 ENCOUNTER — Other Ambulatory Visit: Payer: Self-pay | Admitting: Internal Medicine

## 2013-11-10 ENCOUNTER — Other Ambulatory Visit: Payer: Self-pay | Admitting: *Deleted

## 2013-11-10 MED ORDER — MAGIC MOUTHWASH: ORAL | Status: DC

## 2013-11-10 MED ORDER — LIDOCAINE 5 % EX PTCH: 1.0000 | MEDICATED_PATCH | CUTANEOUS | Status: DC

## 2013-11-10 NOTE — Addendum Note (Signed)
Addended by: Waymond CeraMOFFITT, Marium Ragan L on: 11/10/2013 09:34 AM   Modules accepted: Orders

## 2013-11-10 NOTE — Telephone Encounter (Signed)
[facility]  

## 2013-11-10 NOTE — Telephone Encounter (Signed)
It is OK to refill magic mouthwash

## 2013-11-10 NOTE — Telephone Encounter (Signed)
Is it okay to refill the Magic mouthwash?  Please advise. Thanks

## 2013-11-10 NOTE — Telephone Encounter (Signed)
rx sent to the pharmacy. 

## 2013-11-12 ENCOUNTER — Other Ambulatory Visit: Payer: Self-pay

## 2013-11-12 DIAGNOSIS — G8929 Other chronic pain: Secondary | ICD-10-CM

## 2013-11-12 MED ORDER — OXYCODONE HCL 5 MG PO TABS: ORAL_TABLET | ORAL | Status: DC

## 2013-11-12 MED ORDER — HYDROCODONE-ACETAMINOPHEN 7.5-325 MG/15ML PO SOLN: ORAL | Status: DC

## 2013-11-12 MED ORDER — METHADONE HCL 10 MG PO TABS: ORAL_TABLET | ORAL | Status: DC

## 2013-11-24 ENCOUNTER — Other Ambulatory Visit: Payer: Self-pay | Admitting: *Deleted

## 2013-11-24 MED ORDER — TESTOSTERONE 20.25 MG/1.25GM (1.62%) TD GEL: 2.0000 "application " | Freq: Once | TRANSDERMAL | Status: DC

## 2013-11-24 NOTE — Telephone Encounter (Signed)
rx faxed to [facility] @ [phone]

## 2013-11-26 ENCOUNTER — Telehealth: Payer: Self-pay | Admitting: *Deleted

## 2013-11-26 MED ORDER — TESTOSTERONE 20.25 MG/1.25GM (1.62%) TD GEL: 2.0000 "application " | Freq: Once | TRANSDERMAL | Status: DC

## 2013-11-26 MED ORDER — DIAZEPAM 10 MG PO TABS: ORAL_TABLET | ORAL | Status: DC

## 2013-11-26 NOTE — Telephone Encounter (Signed)
rx given verb[facility] to [facility]

## 2013-11-28 ENCOUNTER — Other Ambulatory Visit: Payer: Self-pay | Admitting: Internal Medicine

## 2013-12-11 ENCOUNTER — Other Ambulatory Visit: Payer: Self-pay | Admitting: *Deleted

## 2013-12-11 DIAGNOSIS — G8929 Other chronic pain: Secondary | ICD-10-CM

## 2013-12-11 MED ORDER — OXYCODONE HCL 5 MG PO TABS: ORAL_TABLET | ORAL | Status: DC

## 2013-12-11 MED ORDER — HYDROCODONE-ACETAMINOPHEN 7.5-325 MG/15ML PO SOLN: ORAL | Status: DC

## 2013-12-11 MED ORDER — METHADONE HCL 10 MG PO TABS: ORAL_TABLET | ORAL | Status: DC

## 2013-12-16 ENCOUNTER — Other Ambulatory Visit: Payer: Self-pay | Admitting: Internal Medicine

## 2013-12-17 ENCOUNTER — Other Ambulatory Visit: Payer: Self-pay | Admitting: Nurse Practitioner

## 2013-12-22 ENCOUNTER — Other Ambulatory Visit: Payer: Self-pay | Admitting: Internal Medicine

## 2013-12-30 ENCOUNTER — Ambulatory Visit: Payer: Medicare Other | Admitting: Internal Medicine

## 2014-01-05 ENCOUNTER — Other Ambulatory Visit: Payer: Self-pay | Admitting: Internal Medicine

## 2014-01-07 ENCOUNTER — Other Ambulatory Visit: Payer: Self-pay | Admitting: *Deleted

## 2014-01-07 DIAGNOSIS — G8929 Other chronic pain: Secondary | ICD-10-CM

## 2014-01-07 MED ORDER — METHADONE HCL 10 MG PO TABS: ORAL_TABLET | ORAL | Status: DC

## 2014-01-07 MED ORDER — HYDROCODONE-ACETAMINOPHEN 7.5-325 MG/15ML PO SOLN: ORAL | Status: DC

## 2014-01-07 MED ORDER — OXYCODONE HCL 5 MG PO TABS: ORAL_TABLET | ORAL | Status: DC

## 2014-01-07 NOTE — Telephone Encounter (Signed)
Printed per Dr. Reed 

## 2014-01-12 ENCOUNTER — Other Ambulatory Visit: Payer: Self-pay | Admitting: *Deleted

## 2014-01-12 MED ORDER — TEMAZEPAM 30 MG PO CAPS: ORAL_CAPSULE | ORAL | Status: DC

## 2014-01-12 NOTE — Telephone Encounter (Signed)
[facility]  

## 2014-01-19 ENCOUNTER — Telehealth: Payer: Self-pay

## 2014-01-19 MED ORDER — DIAZEPAM 10 MG PO TABS: ORAL_TABLET | ORAL | Status: DC

## 2014-01-19 NOTE — Telephone Encounter (Signed)
[facility], question if rx for diazepam and be filled on 01/22/2014 (5 days earlier). Per Dr.Green ok to fill  [facility]

## 2014-01-27 ENCOUNTER — Encounter: Payer: Self-pay | Admitting: Internal Medicine

## 2014-01-27 ENCOUNTER — Ambulatory Visit (INDEPENDENT_AMBULATORY_CARE_PROVIDER_SITE_OTHER): Payer: Medicare Other | Admitting: Internal Medicine

## 2014-01-27 VITALS — BP 170/100 | HR 92 | Temp 98.9°F | Resp 10 | Wt 213.0 lb

## 2014-01-27 DIAGNOSIS — E291 Testicular hypofunction: Secondary | ICD-10-CM | POA: Diagnosis not present

## 2014-01-27 DIAGNOSIS — M25559 Pain in unspecified hip: Secondary | ICD-10-CM

## 2014-01-27 DIAGNOSIS — M25529 Pain in unspecified elbow: Secondary | ICD-10-CM

## 2014-01-27 DIAGNOSIS — I10 Essential (primary) hypertension: Secondary | ICD-10-CM | POA: Diagnosis not present

## 2014-01-27 DIAGNOSIS — E119 Type 2 diabetes mellitus without complications: Secondary | ICD-10-CM | POA: Diagnosis not present

## 2014-01-27 DIAGNOSIS — E785 Hyperlipidemia, unspecified: Secondary | ICD-10-CM | POA: Diagnosis not present

## 2014-01-27 DIAGNOSIS — I739 Peripheral vascular disease, unspecified: Secondary | ICD-10-CM

## 2014-01-27 DIAGNOSIS — D649 Anemia, unspecified: Secondary | ICD-10-CM

## 2014-01-27 DIAGNOSIS — R5381 Other malaise: Secondary | ICD-10-CM | POA: Insufficient documentation

## 2014-01-27 DIAGNOSIS — N529 Male erectile dysfunction, unspecified: Secondary | ICD-10-CM

## 2014-01-27 DIAGNOSIS — G8929 Other chronic pain: Secondary | ICD-10-CM

## 2014-01-27 DIAGNOSIS — M25522 Pain in left elbow: Secondary | ICD-10-CM

## 2014-01-27 DIAGNOSIS — R5383 Other fatigue: Secondary | ICD-10-CM

## 2014-01-27 DIAGNOSIS — E349 Endocrine disorder, unspecified: Secondary | ICD-10-CM

## 2014-01-27 MED ORDER — CAMPHOR-MENTHOL-METHYL SAL 1.2-5.7-6.3 % EX PTCH: MEDICATED_PATCH | CUTANEOUS | Status: DC

## 2014-01-27 NOTE — Patient Instructions (Signed)
Continue current medications. Try Salonpas pain patch

## 2014-01-27 NOTE — Progress Notes (Signed)
Patient ID: TREVION HOBEN, male   DOB: 05-Dec-1950, 63 y.o.   MRN: [id number]    Location:    [facility]  Place of Service:  OFFICE    Allergies  Allergen Reactions  . Codeine   . Demerol   . Disalcid [Salsalate]   . Feldene [Piroxicam]   . Penicillins   . Sulfa Antibiotics     Chief Complaint  Patient presents with  . Follow-up    Patient here today to follow-up on various concerns. 1) Bilateral hip pain, worse in left  2.) Arm pain (left side) 3.) Discuss rx for pain patch 4.) Discuss data in chart- update/correct    HPI:  Pain in the left hip. "I think there might be something broken."  Pain in left leg from the foot to knee. Accompaned by cramping and electrical burning sensation.  Anemia, unspecified anemia type: needs follow up  Type II or unspecified type diabetes mellitus without mention of complication, not stated as uncontrolled - no recent lab.  Unspecified essential hypertension - SBP and DBP running high today. He says that at the drugstore, only the SBP ever seems to be high  Other and unspecified hyperlipidemia - Needs recheck of Lipid panel  Other malaise and fatigue - needs follow up TSH. Using levethyroxine.  Testosterone insufficiency - now off supplements. Needs recheck of Testosterone.  Hip pain, unspecified laterality: left greater than the right, but he is having pain on both sides  Peripheral vascular disease: constant pain in the feet and lower legs that he believes is related to documented PVD. Has consulted with both Dr. Edilia Bo and Dr. Allyson Sabal. Neither believes there is a role for surgery or potential for revascularization.  Chronic pain: multiple sources--chronic arachnoiditis, chronic right knee pain, subacute hip pains, chronic pain in lower legs and feet related to PVD. He has been on high dose narcotic medications for 35 years.  Pain in the left elbow: lateral malleolar area. No known injury.  Impotence: patient says he can get erections and he  believes this diagnosis was entered in error    Medications: Patient's Medications  New Prescriptions   No medications on file  Previous Medications   ALUM & MAG HYDROXIDE-SIMETH (MAGIC MOUTHWASH) SOLN    Swish and swallow one teaspoonful four times daily **shake well**   CARISOPRODOL (SOMA) 350 MG TABLET    Take one tablet 4 times a day as needed for muscle spasm   DIAZEPAM (VALIUM) 10 MG TABLET    TAKE 1 TABLET UP TO FOUR TIMES DAILY TO RELAX MUSCLES.   DIPHENHYD-HYDROCORT-NYSTATIN SUSP    Swish ans Swallow 5cc four times a day as needed   HYDROCODONE-ACETAMINOPHEN (HYCET) 7.5-325 MG/15 ML SOLUTION    Mix Hycet with 960cc guafenesin  100mg /86mL and 480 mL chlorpheniramine 8mg /5cc 1 -2 tsp up to 4 times a day.   LEVOTHYROXINE (SYNTHROID, LEVOTHROID) 50 MCG TABLET    TAKE 1 TABLET EACH DAY FOR THYROID.   LIDOCAINE (LIDODERM) 5 %    Place 1 patch onto the skin daily. Remove & Discard patch within 12 hours or as directed by MD   LOSARTAN (COZAAR) 50 MG TABLET    One daily to control BP   METHADONE (DOLOPHINE) 10 MG TABLET    Take four tablets every 6 hours to control pain   MINOXIDIL (LONITEN) 10 MG TABLET    TAKE 1 TABLET DAILY TO CONTROL BLOOD PRESSURE.   NITRO-BID 2 % OINTMENT    APPLY 1 INCH STRIP  TO FEET TWICE DAILY.   OMEPRAZOLE (PRILOSEC) 20 MG CAPSULE    TAKE (2) CAPSULES TWICE DAILY.   OXYCODONE (ROXICODONE) 5 MG IMMEDIATE RELEASE TABLET    Take four tablets every 6 hours as needed for pain   SF 5000 PLUS 1.1 % CREA DENTAL CREAM    BRUSH ON TEETH FOR 2 MINUTES THEN SWISH AND EXPECTORATE.   TEMAZEPAM (RESTORIL) 30 MG CAPSULE    Take one capsule at bedtime as needed for rest   TESTOSTERONE 20.25 MG/1.25GM (1.62%) GEL    Place 2 application onto the skin once.   TRAMADOL (ULTRAM) 50 MG TABLET    TAKE 1 TABLET UP TO FOUR TIMES DAILY AS NEEDED FOR PAIN.   VOLTAREN 1 % GEL    APPLY 2-4GM TO AFFECTED AREA UP TO 4 TIMES A DAY  Modified Medications   No medications on file    Discontinued Medications   No medications on file     Review of Systems  Constitutional: Positive for activity change. Negative for appetite change.  HENT: Negative for congestion and rhinorrhea.   Eyes: Positive for visual disturbance.  Respiratory: Positive for cough and shortness of breath.        Producing sputum  Cardiovascular: Positive for leg swelling. Negative for chest pain and palpitations.  Gastrointestinal:       Loose stools  Endocrine: Positive for polydipsia.  Musculoskeletal: Positive for arthralgias, back pain, gait problem, joint swelling (Right knee), myalgias, neck pain and neck stiffness.       Pain in both hips.  Skin: Negative.   Allergic/Immunologic: Negative.   Neurological: Positive for dizziness, weakness and numbness.  Hematological:       History of anemia.  Psychiatric/Behavioral: Positive for decreased concentration. Negative for suicidal ideas, behavioral problems and confusion. The patient is nervous/anxious.     Filed Vitals:   01/27/14 1251  BP: 170/100  Pulse: 92  Temp: 98.9 F (37.2 C)  TempSrc: Oral  Resp: 10  Weight: 213 lb (96.616 kg)   Body mass index is 31.44 kg/(m^2).  Physical Exam  Vitals reviewed. Constitutional: He is oriented to person, place, and time.  Mesomorphic. Rambling in thought content.  HENT:  Head: Normocephalic and atraumatic.  Right Ear: External ear normal.  Left Ear: External ear normal.  Nose: Nose normal.  Mouth/Throat: Oropharynx is clear and moist.  Eyes: Conjunctivae and EOM are normal. Pupils are equal, round, and reactive to light.  Neck: No JVD present. No tracheal deviation present. No thyromegaly present.  Cardiovascular: Normal rate, regular rhythm and normal heart sounds.  Exam reveals no gallop and no friction rub.   No murmur heard. Pulmonary/Chest: No respiratory distress. He has no wheezes. He has no rales.  Abdominal: He exhibits no distension and no mass. There is no tenderness.   Musculoskeletal: He exhibits edema and tenderness.  Muscular weakness and pains. Unstable gait. Uses walker. Velcro brace on the right knee. Ganglion cyst left wrist dorsum. Exquisitely tender to palpation at the left elbow malleolus.  Lymphadenopathy:    He has no cervical adenopathy.  Neurological: He is alert and oriented to person, place, and time. He displays abnormal reflex. No cranial nerve deficit. Coordination abnormal.  Skin: No rash noted. No erythema. No pallor.  Psychiatric:  Rambling speech. Frustrated.     Labs reviewed: No visits with results within 3 Month(s) from this visit. Latest known visit with results is:  Office Visit on 04/08/2013  Component Date Value Ref Range Status  .  WBC 04/08/2013 7.1  3.4 - 10.8 x10E3/uL Final  . RBC 04/08/2013 4.22  4.14 - 5.80 x10E6/uL Final  . Hemoglobin 04/08/2013 12.8  12.6 - 17.7 g/dL Final  . HCT 96/10/5407 37.4* 37.5 - 51.0 % Final  . MCV 04/08/2013 89  79 - 97 fL Final  . MCH 04/08/2013 30.3  26.6 - 33.0 pg Final  . MCHC 04/08/2013 34.2  31.5 - 35.7 g/dL Final  . RDW 81/19/1478 13.4  12.3 - 15.4 % Final  . Neutrophils Relative % 04/08/2013 65   Final  . Lymphs 04/08/2013 25   Final  . Monocytes 04/08/2013 9   Final  . Eos 04/08/2013 1   Final  . Basos 04/08/2013 0   Final  . Neutrophils Absolute 04/08/2013 4.6  1.4 - 7.0 x10E3/uL Final  . Lymphocytes Absolute 04/08/2013 1.8  0.7 - 3.1 x10E3/uL Final  . Monocytes Absolute 04/08/2013 0.6  0.1 - 0.9 x10E3/uL Final  . Eosinophils Absolute 04/08/2013 0.1  0.0 - 0.4 x10E3/uL Final  . Basophils Absolute 04/08/2013 0.0  0.0 - 0.2 x10E3/uL Final  . Immature Granulocytes 04/08/2013 0   Final  . Immature Grans (Abs) 04/08/2013 0.0  0.0 - 0.1 x10E3/uL Final  . Testosterone 04/08/2013 299* 348 - 1197 ng/dL Final  . COMMENT 29/56/2130 Comment   Final   Comment: Adult male reference interval is based on a population of lean males                          up to 62 years old.  .  Testosterone, Free 04/08/2013 4.0* 6.6 - 18.1 pg/mL Final  . Glucose 04/08/2013 110* 65 - 99 mg/dL Final  . BUN 86/57/8469 18  8 - 27 mg/dL Final  . Creatinine, Ser 04/08/2013 1.18  0.76 - 1.27 mg/dL Final  . GFR calc non Af Amer 04/08/2013 66  >59 mL/min/1.73 Final  . GFR calc Af Amer 04/08/2013 77  >59 mL/min/1.73 Final  . BUN/Creatinine Ratio 04/08/2013 15  10 - 22 Final  . Sodium 04/08/2013 140  134 - 144 mmol/L Final  . Potassium 04/08/2013 4.2  3.5 - 5.2 mmol/L Final  . Chloride 04/08/2013 99  97 - 108 mmol/L Final  . CO2 04/08/2013 24  18 - 29 mmol/L Final  . Calcium 04/08/2013 10.2  8.6 - 10.2 mg/dL Final  . Total Protein 04/08/2013 7.1  6.0 - 8.5 g/dL Final  . Albumin 62/95/2841 5.0* 3.6 - 4.8 g/dL Final  . Globulin, Total 04/08/2013 2.1  1.5 - 4.5 g/dL Final  . Albumin/Globulin Ratio 04/08/2013 2.4  1.1 - 2.5 Final  . Total Bilirubin 04/08/2013 0.2  0.0 - 1.2 mg/dL Final  . Alkaline Phosphatase 04/08/2013 98  39 - 117 IU/L Final  . AST 04/08/2013 23  0 - 40 IU/L Final  . ALT 04/08/2013 26  0 - 44 IU/L Final  . TSH 04/08/2013 2.400  0.450 - 4.500 uIU/mL Final  . Cholesterol, Total 04/08/2013 190  100 - 199 mg/dL Final  . Triglycerides 04/08/2013 206* 0 - 149 mg/dL Final  . HDL 32/44/0102 55  >39 mg/dL Final   Comment: According to ATP-III Guidelines, HDL-C >59 mg/dL is considered a                          negative risk factor for CHD.  Marland Kitchen VLDL Cholesterol Cal 04/08/2013 41* 5 - 40 mg/dL Final  . LDL Calculated  04/08/2013 94  0 - 99 mg/dL Final  . Chol/HDL Ratio 04/08/2013 3.5  0.0 - 5.0 ratio units Final  . Specific Gravity, UA 04/13/2013 1.026  1.005 - 1.030 Final  . pH, UA 04/13/2013 5.5  5.0 - 7.5 Final  . Color, UA 04/13/2013 Yellow  Yellow Final  . Appearance Ur 04/13/2013 Clear  Clear Final  . Leukocytes, UA 04/13/2013 Negative  Negative Final  . Protein, UA 04/13/2013 Negative  Negative/Trace Final  . Glucose, UA 04/13/2013 Negative  Negative Final  . Ketones, UA  04/13/2013 Negative  Negative Final  . RBC, UA 04/13/2013 Negative  Negative Final  . Bilirubin, UA 04/13/2013 Negative  Negative Final  . Urobilinogen, Ur 04/13/2013 0.2  0.0 - 1.9 mg/dL Final  . Nitrite, UA 16/10/960409/29/2014 Negative  Negative Final  . Hemoglobin A1C 04/08/2013 5.9* 4.8 - 5.6 % Final   Comment:          Increased risk for diabetes: 5.7 - 6.4                                   Diabetes: >6.4                                   Glycemic control for adults with diabetes: <7.0  . Estimated average glucose 04/08/2013 123   Final  . Please note 04/08/2013 Comment   Final   Comment: The date and/or time of collection was not indicated on the                          requisition as required by state and federal law.  The date of                          receipt of the specimen was used as the collection date if not                          supplied.      Assessment/Plan  1. Anemia, unspecified anemia type - CBC With differential/Platelet  2. Type II or unspecified type diabetes mellitus without mention of complication, not stated as uncontrolled - CMP - Hemoglobin A1C  3. Unspecified essential hypertension - CMP  4. Other and unspecified hyperlipidemia - Lipid panel  5. Other malaise and fatigu - TSH  6. Testosterone insufficiency - Testosterone  7. Hip pain, unspecified laterality -xray pelvis and hips  8. Peripheral vascular disease obseerve  9. Chronic pain Continue current pain medications  10. Impotence Will remove from problem list  11. Pain left elbow -xray left elbow

## 2014-01-28 LAB — CBC WITH DIFFERENTIAL
Basophils Absolute: 0 10*3/uL (ref 0.0–0.2)
Basos: 0 %
EOS: 1 %
Eosinophils Absolute: 0 10*3/uL (ref 0.0–0.4)
HCT: 37.4 % — ABNORMAL LOW (ref 37.5–51.0)
Hemoglobin: 12.8 g/dL (ref 12.6–17.7)
Immature Grans (Abs): 0 10*3/uL (ref 0.0–0.1)
Immature Granulocytes: 0 %
LYMPHS: 20 %
Lymphocytes Absolute: 1.4 10*3/uL (ref 0.7–3.1)
MCH: 29.7 pg (ref 26.6–33.0)
MCHC: 34.2 g/dL (ref 31.5–35.7)
MCV: 87 fL (ref 79–97)
MONOS ABS: 0.5 10*3/uL (ref 0.1–0.9)
Monocytes: 7 %
NEUTROS PCT: 72 %
Neutrophils Absolute: 5.1 10*3/uL (ref 1.4–7.0)
Platelets: 365 10*3/uL (ref 150–379)
RBC: 4.31 x10E6/uL (ref 4.14–5.80)
RDW: 12.9 % (ref 12.3–15.4)
WBC: 7 10*3/uL (ref 3.4–10.8)

## 2014-01-28 LAB — COMPREHENSIVE METABOLIC PANEL
A/G RATIO: 1.8 (ref 1.1–2.5)
ALBUMIN: 4.7 g/dL (ref 3.6–4.8)
ALT: 16 IU/L (ref 0–44)
AST: 20 IU/L (ref 0–40)
Alkaline Phosphatase: 100 IU/L (ref 39–117)
BUN/Creatinine Ratio: 15 (ref 10–22)
BUN: 20 mg/dL (ref 8–27)
CALCIUM: 10 mg/dL (ref 8.6–10.2)
CO2: 22 mmol/L (ref 18–29)
Chloride: 99 mmol/L (ref 97–108)
Creatinine, Ser: 1.35 mg/dL — ABNORMAL HIGH (ref 0.76–1.27)
GFR calc Af Amer: 65 mL/min/{1.73_m2} (ref >59)
GFR, EST NON AFRICAN AMERICAN: 56 mL/min/{1.73_m2} — AB (ref >59)
GLOBULIN, TOTAL: 2.6 g/dL (ref 1.5–4.5)
GLUCOSE: 140 mg/dL — AB (ref 65–99)
Potassium: 4.5 mmol/L (ref 3.5–5.2)
Sodium: 141 mmol/L (ref 134–144)
TOTAL PROTEIN: 7.3 g/dL (ref 6.0–8.5)
Total Bilirubin: 0.2 mg/dL (ref 0.0–1.2)

## 2014-01-28 LAB — HEMOGLOBIN A1C
Est. average glucose Bld gHb Est-mCnc: 126 mg/dL
Hgb A1c MFr Bld: 6 % — ABNORMAL HIGH (ref 4.8–5.6)

## 2014-01-28 LAB — TSH: TSH: 2.08 u[IU]/mL (ref 0.450–4.500)

## 2014-01-28 LAB — TESTOSTERONE: Testosterone: 385 ng/dL (ref 348–1197)

## 2014-01-28 LAB — LIPID PANEL
CHOL/HDL RATIO: 4.3 ratio (ref 0.0–5.0)
Cholesterol, Total: 228 mg/dL — ABNORMAL HIGH (ref 100–199)
HDL: 53 mg/dL (ref >39)
LDL Calculated: 143 mg/dL — ABNORMAL HIGH (ref 0–99)
Triglycerides: 162 mg/dL — ABNORMAL HIGH (ref 0–149)
VLDL CHOLESTEROL CAL: 32 mg/dL (ref 5–40)

## 2014-01-29 ENCOUNTER — Telehealth: Payer: Self-pay | Admitting: Internal Medicine

## 2014-01-29 ENCOUNTER — Ambulatory Visit
Admission: RE | Admit: 2014-01-29 | Discharge: 2014-01-29 | Disposition: A | Discharge status: Home / Self Care | Payer: Medicare Other | Source: Ambulatory Visit | Attending: Internal Medicine | Admitting: Internal Medicine

## 2014-01-29 DIAGNOSIS — M25522 Pain in left elbow: Secondary | ICD-10-CM

## 2014-01-29 DIAGNOSIS — M25559 Pain in unspecified hip: Secondary | ICD-10-CM | POA: Diagnosis not present

## 2014-01-29 DIAGNOSIS — M25529 Pain in unspecified elbow: Secondary | ICD-10-CM | POA: Diagnosis not present

## 2014-01-29 NOTE — Telephone Encounter (Signed)
Onalee HuaDavid Paediatric nursebarber dropped off a [employer] form on 01/29/2014 to be filled out by Dr. Chilton SiGreen. The form was placed in the black basket for the CMA's.

## 2014-01-29 NOTE — Telephone Encounter (Signed)
Jury duty letter given to Dr. Chilton SiGreen for letter to excuse

## 2014-02-04 ENCOUNTER — Other Ambulatory Visit: Payer: Self-pay | Admitting: *Deleted

## 2014-02-04 DIAGNOSIS — G8929 Other chronic pain: Secondary | ICD-10-CM

## 2014-02-04 MED ORDER — HYDROCODONE-ACETAMINOPHEN 7.5-325 MG/15ML PO SOLN: ORAL | Status: DC

## 2014-02-04 MED ORDER — METHADONE HCL 10 MG PO TABS: ORAL_TABLET | ORAL | Status: DC

## 2014-02-04 MED ORDER — OXYCODONE HCL 5 MG PO TABS: ORAL_TABLET | ORAL | Status: DC

## 2014-02-07 ENCOUNTER — Other Ambulatory Visit: Payer: Self-pay | Admitting: Internal Medicine

## 2014-02-10 ENCOUNTER — Encounter: Payer: Self-pay | Admitting: Internal Medicine

## 2014-02-11 ENCOUNTER — Other Ambulatory Visit: Payer: Self-pay | Admitting: Internal Medicine

## 2014-02-12 ENCOUNTER — Other Ambulatory Visit: Payer: Self-pay | Admitting: Internal Medicine

## 2014-02-19 ENCOUNTER — Other Ambulatory Visit: Payer: Self-pay | Admitting: *Deleted

## 2014-02-19 MED ORDER — DIAZEPAM 10 MG PO TABS: ORAL_TABLET | ORAL | Status: DC

## 2014-02-19 NOTE — Telephone Encounter (Signed)
[facility]  

## 2014-02-22 ENCOUNTER — Other Ambulatory Visit: Payer: Self-pay | Admitting: *Deleted

## 2014-02-22 MED ORDER — SODIUM FLUORIDE 1.1 % DT CREA: TOPICAL_CREAM | DENTAL | Status: DC

## 2014-02-22 NOTE — Telephone Encounter (Signed)
[facility]  

## 2014-03-04 ENCOUNTER — Other Ambulatory Visit: Payer: Self-pay | Admitting: *Deleted

## 2014-03-04 DIAGNOSIS — G8929 Other chronic pain: Secondary | ICD-10-CM

## 2014-03-04 MED ORDER — METHADONE HCL 10 MG PO TABS: ORAL_TABLET | ORAL | Status: DC

## 2014-03-04 MED ORDER — OXYCODONE HCL 5 MG PO TABS: ORAL_TABLET | ORAL | Status: DC

## 2014-03-04 MED ORDER — HYDROCODONE-ACETAMINOPHEN 7.5-325 MG/15ML PO SOLN: ORAL | Status: DC

## 2014-03-05 ENCOUNTER — Other Ambulatory Visit: Payer: Self-pay | Admitting: *Deleted

## 2014-03-05 MED ORDER — TRAMADOL HCL 50 MG PO TABS: ORAL_TABLET | ORAL | Status: DC

## 2014-03-05 NOTE — Telephone Encounter (Signed)
[facility]  

## 2014-03-08 ENCOUNTER — Other Ambulatory Visit: Payer: Self-pay | Admitting: *Deleted

## 2014-03-08 MED ORDER — TEMAZEPAM 30 MG PO CAPS: ORAL_CAPSULE | ORAL | Status: DC

## 2014-03-08 NOTE — Telephone Encounter (Signed)
[facility]  

## 2014-03-15 ENCOUNTER — Other Ambulatory Visit: Payer: Self-pay | Admitting: *Deleted

## 2014-03-15 MED ORDER — TESTOSTERONE 20.25 MG/1.25GM (1.62%) TD GEL: 2.0000 "application " | Freq: Once | TRANSDERMAL | Status: DC

## 2014-03-15 NOTE — Telephone Encounter (Signed)
[facility]  

## 2014-03-16 ENCOUNTER — Other Ambulatory Visit: Payer: Self-pay | Admitting: *Deleted

## 2014-03-16 MED ORDER — DIAZEPAM 10 MG PO TABS: ORAL_TABLET | ORAL | Status: DC

## 2014-03-16 NOTE — Telephone Encounter (Signed)
[facility]  

## 2014-03-26 ENCOUNTER — Other Ambulatory Visit: Payer: Self-pay | Admitting: Internal Medicine

## 2014-03-31 ENCOUNTER — Telehealth: Payer: Self-pay | Admitting: *Deleted

## 2014-03-31 DIAGNOSIS — G8929 Other chronic pain: Secondary | ICD-10-CM

## 2014-03-31 NOTE — Telephone Encounter (Signed)
Patient called and stated that he has a knot on his Left elbow and it is causing Left arm pain down into his hand and across his back. Has been a result of 2 small wrecks due to these symptoms. Patient wants to know what to do about this. And also patient wants refills on his Oxy,Methadone and Hydrocodone Liquid. Patient last gotten it refilled on 8/20. Is this ok to fill? Please Advise.

## 2014-04-01 MED ORDER — OXYCODONE HCL 5 MG PO TABS: ORAL_TABLET | ORAL | Status: DC

## 2014-04-01 MED ORDER — METHADONE HCL 10 MG PO TABS: ORAL_TABLET | ORAL | Status: DC

## 2014-04-01 MED ORDER — HYDROCODONE-ACETAMINOPHEN 7.5-325 MG/15ML PO SOLN: ORAL | Status: DC

## 2014-04-01 NOTE — Telephone Encounter (Signed)
Patient picked up Rx's this morning

## 2014-04-01 NOTE — Telephone Encounter (Signed)
OK to do his medication refill.  I will need to see his knot before I can advise further.

## 2014-04-01 NOTE — Telephone Encounter (Signed)
Patient informed he needed an appointment

## 2014-04-01 NOTE — Telephone Encounter (Signed)
Printed Rx for patient to pick up. Patient only has a ride today to pick them up.

## 2014-04-03 ENCOUNTER — Other Ambulatory Visit: Payer: Self-pay | Admitting: Internal Medicine

## 2014-04-09 ENCOUNTER — Other Ambulatory Visit: Payer: Self-pay | Admitting: *Deleted

## 2014-04-09 MED ORDER — DIAZEPAM 10 MG PO TABS: ORAL_TABLET | ORAL | Status: DC

## 2014-04-09 NOTE — Telephone Encounter (Signed)
[facility]  

## 2014-04-28 ENCOUNTER — Other Ambulatory Visit: Payer: Self-pay | Admitting: *Deleted

## 2014-04-28 DIAGNOSIS — G8929 Other chronic pain: Secondary | ICD-10-CM

## 2014-04-28 MED ORDER — OXYCODONE HCL 5 MG PO TABS: ORAL_TABLET | ORAL | Status: DC

## 2014-04-28 MED ORDER — HYDROCODONE-ACETAMINOPHEN 7.5-325 MG/15ML PO SOLN: ORAL | Status: DC

## 2014-04-28 MED ORDER — METHADONE HCL 10 MG PO TABS: ORAL_TABLET | ORAL | Status: DC

## 2014-04-28 NOTE — Telephone Encounter (Signed)
Patient Requested and will pick up 

## 2014-04-29 ENCOUNTER — Other Ambulatory Visit: Payer: Self-pay | Admitting: *Deleted

## 2014-04-29 MED ORDER — TRAMADOL HCL 50 MG PO TABS: ORAL_TABLET | ORAL | Status: DC

## 2014-05-07 ENCOUNTER — Other Ambulatory Visit: Payer: Self-pay

## 2014-05-07 MED ORDER — DIAZEPAM 10 MG PO TABS: ORAL_TABLET | ORAL | Status: DC

## 2014-05-07 NOTE — Telephone Encounter (Signed)
Called in RX 

## 2014-05-14 ENCOUNTER — Other Ambulatory Visit: Payer: Self-pay | Admitting: Internal Medicine

## 2014-05-19 ENCOUNTER — Telehealth: Payer: Self-pay | Admitting: *Deleted

## 2014-05-19 ENCOUNTER — Encounter: Payer: Self-pay | Admitting: *Deleted

## 2014-05-19 NOTE — Telephone Encounter (Signed)
Called [facility], asked for prior authorization form, filled out form and faxed it back.

## 2014-05-20 ENCOUNTER — Other Ambulatory Visit: Payer: Self-pay | Admitting: Internal Medicine

## 2014-05-26 ENCOUNTER — Other Ambulatory Visit: Payer: Self-pay | Admitting: *Deleted

## 2014-05-26 DIAGNOSIS — G8929 Other chronic pain: Secondary | ICD-10-CM

## 2014-05-26 MED ORDER — METHADONE HCL 10 MG PO TABS: ORAL_TABLET | ORAL | Status: DC

## 2014-05-26 MED ORDER — TRAMADOL HCL 50 MG PO TABS: ORAL_TABLET | ORAL | Status: DC

## 2014-05-26 MED ORDER — OXYCODONE HCL 5 MG PO TABS: ORAL_TABLET | ORAL | Status: DC

## 2014-05-26 MED ORDER — HYDROCODONE-ACETAMINOPHEN 7.5-325 MG/15ML PO SOLN: ORAL | Status: DC

## 2014-05-26 NOTE — Telephone Encounter (Signed)
Patient Requested and will pick up 

## 2014-05-26 NOTE — Telephone Encounter (Signed)
[facility]  

## 2014-06-08 ENCOUNTER — Other Ambulatory Visit: Payer: Self-pay

## 2014-06-08 ENCOUNTER — Other Ambulatory Visit: Payer: Self-pay | Admitting: Internal Medicine

## 2014-06-08 MED ORDER — DIAZEPAM 10 MG PO TABS: ORAL_TABLET | ORAL | Status: DC

## 2014-06-08 NOTE — Telephone Encounter (Signed)
RX called into [facility] 

## 2014-06-11 ENCOUNTER — Other Ambulatory Visit: Payer: Self-pay | Admitting: Internal Medicine

## 2014-06-14 ENCOUNTER — Other Ambulatory Visit: Payer: Self-pay | Admitting: *Deleted

## 2014-06-14 MED ORDER — TESTOSTERONE 20.25 MG/1.25GM (1.62%) TD GEL: 2.0000 "application " | Freq: Once | TRANSDERMAL | Status: DC

## 2014-06-14 NOTE — Telephone Encounter (Signed)
[facility]  

## 2014-06-15 ENCOUNTER — Other Ambulatory Visit: Payer: Self-pay | Admitting: *Deleted

## 2014-06-15 MED ORDER — CARISOPRODOL 350 MG PO TABS: ORAL_TABLET | ORAL | Status: DC

## 2014-06-15 NOTE — Telephone Encounter (Signed)
[facility]  

## 2014-06-23 ENCOUNTER — Other Ambulatory Visit: Payer: Self-pay | Admitting: *Deleted

## 2014-06-23 MED ORDER — TRAMADOL HCL 50 MG PO TABS: ORAL_TABLET | ORAL | Status: DC

## 2014-06-23 NOTE — Telephone Encounter (Signed)
[facility]  

## 2014-06-24 ENCOUNTER — Other Ambulatory Visit: Payer: Self-pay | Admitting: *Deleted

## 2014-06-24 DIAGNOSIS — G8929 Other chronic pain: Secondary | ICD-10-CM

## 2014-06-24 MED ORDER — HYDROCODONE-ACETAMINOPHEN 7.5-325 MG/15ML PO SOLN: ORAL | Status: DC

## 2014-06-24 MED ORDER — METHADONE HCL 10 MG PO TABS: ORAL_TABLET | ORAL | Status: DC

## 2014-06-24 MED ORDER — OXYCODONE HCL 5 MG PO TABS: ORAL_TABLET | ORAL | Status: DC

## 2014-06-24 NOTE — Telephone Encounter (Signed)
Patient requested and will pick up 

## 2014-06-30 ENCOUNTER — Other Ambulatory Visit: Payer: Self-pay | Admitting: Internal Medicine

## 2014-07-13 ENCOUNTER — Other Ambulatory Visit: Payer: Self-pay | Admitting: Internal Medicine

## 2014-07-21 ENCOUNTER — Other Ambulatory Visit: Payer: Self-pay | Admitting: *Deleted

## 2014-07-21 MED ORDER — TRAMADOL HCL 50 MG PO TABS: ORAL_TABLET | ORAL | Status: DC

## 2014-07-21 NOTE — Telephone Encounter (Signed)
[facility]  

## 2014-07-22 ENCOUNTER — Other Ambulatory Visit: Payer: Self-pay | Admitting: *Deleted

## 2014-07-22 DIAGNOSIS — G8929 Other chronic pain: Secondary | ICD-10-CM

## 2014-07-22 MED ORDER — TRAMADOL HCL 50 MG PO TABS: ORAL_TABLET | ORAL | Status: DC

## 2014-07-22 MED ORDER — OXYCODONE HCL 5 MG PO TABS: ORAL_TABLET | ORAL | Status: DC

## 2014-07-22 MED ORDER — METHADONE HCL 10 MG PO TABS: ORAL_TABLET | ORAL | Status: DC

## 2014-07-22 MED ORDER — HYDROCODONE-ACETAMINOPHEN 7.5-325 MG/15ML PO SOLN: ORAL | Status: DC

## 2014-07-22 NOTE — Telephone Encounter (Signed)
Patient Requested and will pick up 

## 2014-07-22 NOTE — Telephone Encounter (Signed)
[facility] [address] [facility] [address]

## 2014-08-03 ENCOUNTER — Other Ambulatory Visit: Payer: Self-pay | Admitting: Internal Medicine

## 2014-08-12 ENCOUNTER — Other Ambulatory Visit: Payer: Self-pay | Admitting: Internal Medicine

## 2014-08-19 ENCOUNTER — Other Ambulatory Visit: Payer: Self-pay | Admitting: *Deleted

## 2014-08-19 MED ORDER — TRAMADOL HCL 50 MG PO TABS: ORAL_TABLET | ORAL | Status: DC

## 2014-08-19 NOTE — Telephone Encounter (Signed)
[facility]  

## 2014-08-20 ENCOUNTER — Other Ambulatory Visit: Payer: Self-pay | Admitting: *Deleted

## 2014-08-20 DIAGNOSIS — G8929 Other chronic pain: Secondary | ICD-10-CM

## 2014-08-20 MED ORDER — OXYCODONE HCL 5 MG PO TABS: ORAL_TABLET | ORAL | Status: DC

## 2014-08-20 MED ORDER — METHADONE HCL 10 MG PO TABS: ORAL_TABLET | ORAL | Status: DC

## 2014-08-20 MED ORDER — HYDROCODONE-ACETAMINOPHEN 7.5-325 MG/15ML PO SOLN: ORAL | Status: DC

## 2014-08-20 NOTE — Telephone Encounter (Signed)
Patient Requested and will pick up 

## 2014-09-03 ENCOUNTER — Other Ambulatory Visit: Payer: Self-pay

## 2014-09-03 ENCOUNTER — Other Ambulatory Visit: Payer: Self-pay | Admitting: Internal Medicine

## 2014-09-03 MED ORDER — DIAZEPAM 10 MG PO TABS: ORAL_TABLET | ORAL | Status: DC

## 2014-09-06 ENCOUNTER — Other Ambulatory Visit: Payer: Self-pay | Admitting: Internal Medicine

## 2014-09-06 NOTE — Telephone Encounter (Signed)
[facility] rx was received on 09/03/2014

## 2014-09-15 ENCOUNTER — Other Ambulatory Visit: Payer: Self-pay | Admitting: Internal Medicine

## 2014-09-17 ENCOUNTER — Other Ambulatory Visit: Payer: Self-pay | Admitting: *Deleted

## 2014-09-17 DIAGNOSIS — G8929 Other chronic pain: Secondary | ICD-10-CM

## 2014-09-17 MED ORDER — HYDROCODONE-ACETAMINOPHEN 7.5-325 MG/15ML PO SOLN: ORAL | Status: DC

## 2014-09-17 MED ORDER — METHADONE HCL 10 MG PO TABS: ORAL_TABLET | ORAL | Status: DC

## 2014-09-17 MED ORDER — OXYCODONE HCL 5 MG PO TABS: ORAL_TABLET | ORAL | Status: DC

## 2014-09-17 NOTE — Telephone Encounter (Signed)
Patient Requested and will pick up 

## 2014-09-21 ENCOUNTER — Other Ambulatory Visit: Payer: Self-pay | Admitting: Internal Medicine

## 2014-09-30 ENCOUNTER — Other Ambulatory Visit: Payer: Self-pay | Admitting: Internal Medicine

## 2014-10-04 ENCOUNTER — Other Ambulatory Visit: Payer: Self-pay | Admitting: Internal Medicine

## 2014-10-09 ENCOUNTER — Other Ambulatory Visit: Payer: Self-pay | Admitting: Internal Medicine

## 2014-10-12 ENCOUNTER — Other Ambulatory Visit: Payer: Self-pay | Admitting: Internal Medicine

## 2014-10-14 ENCOUNTER — Other Ambulatory Visit: Payer: Self-pay | Admitting: Internal Medicine

## 2014-10-15 ENCOUNTER — Other Ambulatory Visit: Payer: Self-pay | Admitting: *Deleted

## 2014-10-15 DIAGNOSIS — G8929 Other chronic pain: Secondary | ICD-10-CM

## 2014-10-15 MED ORDER — METHADONE HCL 10 MG PO TABS: ORAL_TABLET | ORAL | Status: DC

## 2014-10-15 MED ORDER — OXYCODONE HCL 5 MG PO TABS: ORAL_TABLET | ORAL | Status: DC

## 2014-10-15 MED ORDER — HYDROCODONE-ACETAMINOPHEN 7.5-325 MG/15ML PO SOLN: ORAL | Status: DC

## 2014-10-15 NOTE — Telephone Encounter (Signed)
Patient requested and printed for pick up 

## 2014-10-16 ENCOUNTER — Other Ambulatory Visit: Payer: Self-pay | Admitting: Internal Medicine

## 2014-10-18 ENCOUNTER — Other Ambulatory Visit: Payer: Self-pay | Admitting: Internal Medicine

## 2014-11-02 ENCOUNTER — Other Ambulatory Visit: Payer: Self-pay | Admitting: *Deleted

## 2014-11-02 MED ORDER — DIAZEPAM 10 MG PO TABS: ORAL_TABLET | ORAL | Status: DC

## 2014-11-02 NOTE — Telephone Encounter (Signed)
[facility]  

## 2014-11-09 ENCOUNTER — Other Ambulatory Visit: Payer: Self-pay | Admitting: Internal Medicine

## 2014-11-11 ENCOUNTER — Other Ambulatory Visit: Payer: Self-pay | Admitting: *Deleted

## 2014-11-11 MED ORDER — TRAMADOL HCL 50 MG PO TABS: ORAL_TABLET | ORAL | Status: DC

## 2014-11-11 MED ORDER — TEMAZEPAM 30 MG PO CAPS: ORAL_CAPSULE | ORAL | Status: DC

## 2014-11-11 NOTE — Telephone Encounter (Signed)
[facility]  

## 2014-11-12 ENCOUNTER — Other Ambulatory Visit: Payer: Self-pay | Admitting: *Deleted

## 2014-11-12 DIAGNOSIS — G8929 Other chronic pain: Secondary | ICD-10-CM

## 2014-11-12 MED ORDER — OXYCODONE HCL 5 MG PO TABS: ORAL_TABLET | ORAL | Status: DC

## 2014-11-12 MED ORDER — HYDROCODONE-ACETAMINOPHEN 7.5-325 MG/15ML PO SOLN: ORAL | Status: DC

## 2014-11-12 MED ORDER — METHADONE HCL 10 MG PO TABS
ORAL_TABLET | ORAL | Status: DC
Start: 2014-11-12 — End: 2014-12-09

## 2014-11-12 MED ORDER — METHADONE HCL 10 MG PO TABS: ORAL_TABLET | ORAL | Status: DC

## 2014-11-12 NOTE — Telephone Encounter (Signed)
Patient requested and will pick up Had to reprint on right blue paper

## 2014-11-18 ENCOUNTER — Other Ambulatory Visit: Payer: Self-pay | Admitting: Internal Medicine

## 2014-11-29 ENCOUNTER — Other Ambulatory Visit: Payer: Self-pay | Admitting: Internal Medicine

## 2014-12-01 ENCOUNTER — Telehealth: Payer: Self-pay | Admitting: Nurse Practitioner

## 2014-12-01 ENCOUNTER — Other Ambulatory Visit: Payer: Self-pay | Admitting: Internal Medicine

## 2014-12-01 NOTE — Telephone Encounter (Signed)
Left message on voicemail for patient to return call when available, reason for call: Patient is overdue for an appointment. Patient was last seen in July 2015. Patient was instructed to follow-up in 4 months (that was 10 months ago). Patient needs to schedule and keep follow-up appointment with Dr.Green

## 2014-12-03 MED ORDER — DIAZEPAM 10 MG PO TABS: ORAL_TABLET | ORAL | Status: DC

## 2014-12-03 NOTE — Telephone Encounter (Signed)
Per Shanda BumpsJessica patient to ONLY be given 1/2 supply of Valium to assure that he comes to pending appointment. RX that was printed yesterday was shredded. I called rx in for # 60 vs #120 as instructed by Nurse Practitioner.

## 2014-12-03 NOTE — Addendum Note (Signed)
Addended by: Maurice SmallBEATTY, Teea Ducey C on: 12/03/2014 03:59 PM   Modules accepted: Orders

## 2014-12-03 NOTE — Telephone Encounter (Signed)
I called the patient to discuss rx and inform him that #60 was given, patient overdue for an appointment. Patient was very dissatisfied with this decision, patient states we are breaking the law by not giving him his full supply of medicine, patient states he is very familiar with the law. I informed the patient that in order for us to continue to refill his medications he has to schedule appointments as instructed by the doctor/NP. Patient continued to say we are breaking the law. Patient then hung up the phone in my face.

## 2014-12-09 ENCOUNTER — Other Ambulatory Visit: Payer: Self-pay | Admitting: *Deleted

## 2014-12-09 ENCOUNTER — Other Ambulatory Visit: Payer: Self-pay | Admitting: Internal Medicine

## 2014-12-09 ENCOUNTER — Telehealth: Payer: Self-pay | Admitting: *Deleted

## 2014-12-09 NOTE — Telephone Encounter (Signed)
Patient called demanding his Rx's. I saw previous note regarding patient needed to be seen before Rx's could be written. I spoke with Dr. Renato Gailseed and she agreed to only give him enough till his appointment on 12/14/14. Patient very upset with this and stated that "i [facility]" and hung up on me.

## 2014-12-09 NOTE — Telephone Encounter (Signed)
Per Dr. Reed--Only enough till patient's appointment on 12/14/2014

## 2014-12-10 ENCOUNTER — Telehealth: Payer: Self-pay

## 2014-12-10 ENCOUNTER — Other Ambulatory Visit: Payer: Self-pay | Admitting: *Deleted

## 2014-12-10 DIAGNOSIS — G8929 Other chronic pain: Secondary | ICD-10-CM

## 2014-12-10 MED ORDER — METHADONE HCL 10 MG PO TABS
ORAL_TABLET | ORAL | Status: DC
Start: 2014-12-10 — End: 2014-12-14

## 2014-12-10 MED ORDER — LIDOCAINE 5 % EX PTCH: 1.0000 | MEDICATED_PATCH | CUTANEOUS | Status: DC

## 2014-12-10 MED ORDER — OXYCODONE HCL 5 MG PO TABS
ORAL_TABLET | ORAL | Status: DC
Start: 2014-12-10 — End: 2014-12-14

## 2014-12-10 MED ORDER — HYDROCODONE-ACETAMINOPHEN 7.5-325 MG/15ML PO SOLN: ORAL | Status: DC

## 2014-12-10 NOTE — Telephone Encounter (Signed)
Faxed received indicating PA is needed for lidocaine patch. PA number is [phone] Patients ID [id number]  I called and initiated PA. Form was received, completed, stamped and faxed back. Awaiting response from insurance company.

## 2014-12-10 NOTE — Telephone Encounter (Signed)
Patient Requested and will pick up. Per Dr. Kathlen Modyeed---Will ONLY sign for enough until his appointment on 12/14/2014 with Dr. Chilton SiGreen and then Dr. Chilton SiGreen can give patient the rest of Rx.

## 2014-12-14 ENCOUNTER — Ambulatory Visit (INDEPENDENT_AMBULATORY_CARE_PROVIDER_SITE_OTHER): Payer: Medicare Other | Admitting: Internal Medicine

## 2014-12-14 ENCOUNTER — Encounter: Payer: Self-pay | Admitting: Internal Medicine

## 2014-12-14 VITALS — BP 170/100 | HR 105 | Temp 100.7°F | Resp 22 | Ht 69.0 in | Wt 200.6 lb

## 2014-12-14 DIAGNOSIS — E291 Testicular hypofunction: Secondary | ICD-10-CM

## 2014-12-14 DIAGNOSIS — R509 Fever, unspecified: Secondary | ICD-10-CM | POA: Insufficient documentation

## 2014-12-14 DIAGNOSIS — E1151 Type 2 diabetes mellitus with diabetic peripheral angiopathy without gangrene: Secondary | ICD-10-CM | POA: Diagnosis not present

## 2014-12-14 DIAGNOSIS — K219 Gastro-esophageal reflux disease without esophagitis: Secondary | ICD-10-CM

## 2014-12-14 DIAGNOSIS — I739 Peripheral vascular disease, unspecified: Secondary | ICD-10-CM

## 2014-12-14 DIAGNOSIS — D649 Anemia, unspecified: Secondary | ICD-10-CM | POA: Diagnosis not present

## 2014-12-14 DIAGNOSIS — E785 Hyperlipidemia, unspecified: Secondary | ICD-10-CM

## 2014-12-14 DIAGNOSIS — I10 Essential (primary) hypertension: Secondary | ICD-10-CM

## 2014-12-14 DIAGNOSIS — IMO0001 Reserved for inherently not codable concepts without codable children: Secondary | ICD-10-CM

## 2014-12-14 DIAGNOSIS — R059 Cough, unspecified: Secondary | ICD-10-CM

## 2014-12-14 DIAGNOSIS — G8929 Other chronic pain: Secondary | ICD-10-CM

## 2014-12-14 DIAGNOSIS — E349 Endocrine disorder, unspecified: Secondary | ICD-10-CM

## 2014-12-14 DIAGNOSIS — G039 Meningitis, unspecified: Secondary | ICD-10-CM

## 2014-12-14 DIAGNOSIS — M1990 Unspecified osteoarthritis, unspecified site: Secondary | ICD-10-CM | POA: Diagnosis not present

## 2014-12-14 DIAGNOSIS — K861 Other chronic pancreatitis: Secondary | ICD-10-CM | POA: Diagnosis not present

## 2014-12-14 DIAGNOSIS — R05 Cough: Secondary | ICD-10-CM

## 2014-12-14 DIAGNOSIS — I798 Other disorders of arteries, arterioles and capillaries in diseases classified elsewhere: Secondary | ICD-10-CM

## 2014-12-14 DIAGNOSIS — E039 Hypothyroidism, unspecified: Secondary | ICD-10-CM

## 2014-12-14 HISTORY — DX: Hypothyroidism, unspecified: E03.9

## 2014-12-14 HISTORY — DX: Meningitis, unspecified: G03.9

## 2014-12-14 HISTORY — DX: Other chronic pancreatitis: K86.1

## 2014-12-14 MED ORDER — DICLOFENAC SODIUM 1 % TD GEL: TRANSDERMAL | Status: DC

## 2014-12-14 MED ORDER — DIAZEPAM 10 MG PO TABS: ORAL_TABLET | ORAL | Status: DC

## 2014-12-14 MED ORDER — LOSARTAN POTASSIUM 50 MG PO TABS: ORAL_TABLET | ORAL | Status: DC

## 2014-12-14 MED ORDER — TRAMADOL HCL 50 MG PO TABS: ORAL_TABLET | ORAL | Status: DC

## 2014-12-14 MED ORDER — MINOXIDIL 10 MG PO TABS: ORAL_TABLET | ORAL | Status: DC

## 2014-12-14 MED ORDER — LEVOTHYROXINE SODIUM 50 MCG PO TABS: ORAL_TABLET | ORAL | Status: DC

## 2014-12-14 MED ORDER — HYDROCODONE-ACETAMINOPHEN 7.5-325 MG/15ML PO SOLN: ORAL | Status: DC

## 2014-12-14 MED ORDER — OXYCODONE HCL 10 MG PO TABS: ORAL_TABLET | ORAL | Status: DC

## 2014-12-14 MED ORDER — METHADONE HCL 10 MG PO TABS: ORAL_TABLET | ORAL | Status: DC

## 2014-12-14 MED ORDER — NITROGLYCERIN 2 % TD OINT: TOPICAL_OINTMENT | TRANSDERMAL | Status: DC

## 2014-12-14 NOTE — Progress Notes (Signed)
Patient ID: Austin Barnes, male   DOB: 16-Nov-1950, 64 y.o.   MRN: [id number]    Facility  [facility]    Place of Service:   OFFICE     Allergies  Allergen Reactions  . Codeine   . Demerol   . Disalcid [Salsalate]   . Feldene [Piroxicam]   . Penicillins   . Sulfa Antibiotics     Chief Complaint  Patient presents with  . Medical Management of Chronic Issues    follow-up    HPI:  Patient returns today for reevaluation after a fairly long absence from this office. He was last seen here 01/27/2014. Advised patient that he has a number of chronic diseases that need closer follow-up for optimum management.  Patient is upset and somewhat agitated throughout today's visit. He has a dysfunctional family and he has lengthy complaints about his sister, Shirlee Limerick.  He is feeling great deal of financial stress. 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  He is in pain all the time. 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  He states that he has lost 3 inches in circumference of the left arm. This arm is also getting weaker and he is dropping things.  It is difficult to focus the patient on any particular complaint or problem.  Patient is attempting to get a lidocaine patch for pains in his hip, elbow and other sites of arthritis. His insurance company has refused this.  Patient would like to go to a testosterone injectable supplement instead of the gel that he had been using.    Medications: Patient's Medications  New Prescriptions   No medications on file  Previous Medications   ALUM & MAG HYDROXIDE-SIMETH (MAGIC MOUTHWASH) SOLN    Swish and swallow one teaspoonful four times daily **shake well**   ANDROGEL PUMP 20.25 MG/ACT (1.62%) GEL    APPLY 2 PUMP ACTUATIONS DAILY FOR TESTOSTERONE REPLACEMENT.   CAMPHOR-MENTHOL-METHYL SAL (SALONPAS) 1.2-5.7-6.3 % PTCH    Apply daily for pain relief   CARISOPRODOL (SOMA) 350 MG TABLET    Take one tablet by mouth four times daily as needed for muscle spasm   DIAZEPAM (VALIUM) 10 MG TABLET    TAKE 1 TABLET UP TO FOUR TIMES DAILY TO RELAX MUSCLES.   DIPHENHYD-HYDROCORT-NYSTATIN SUSP    Swish ans Swallow 5cc four times a day as needed   HYDROCODONE-ACETAMINOPHEN (HYCET) 7.5-325 MG/15 ML SOLUTION    Mix Hycet with 960cc guafenesin  100mg /46mL and 480 mL chlorpheniramine 8mg /5cc 1 -2 tsp up to 4 times a day.   LEVOTHYROXINE (SYNTHROID, LEVOTHROID) 50 MCG TABLET    TAKE 1 TABLET EACH DAY FOR THYROID.   LIDOCAINE (LIDODERM) 5 %    Place 1 patch onto the skin daily. Remove & Discard patch within 12 hours or as directed by MD   LOSARTAN (COZAAR) 50 MG TABLET    One daily to control BP   METHADONE (DOLOPHINE) 10 MG TABLET    Take four tablets every 6 hours to control pain   MINOXIDIL (LONITEN) 10 MG TABLET    TAKE 1 TABLET DAILY TO CONTROL BLOOD PRESSURE.   NITRO-BID 2 % OINTMENT    APPLY 1 INCH STRIP TO FEET TWICE DAILY.   OMEPRAZOLE (PRILOSEC) 20 MG CAPSULE    TAKE (2) CAPSULES TWICE DAILY.   OXYCODONE (ROXICODONE) 5 MG IMMEDIATE RELEASE TABLET    Take four tablets every  6 hours as needed for pain   SF 5000 PLUS 1.1 % CREA DENTAL CREAM    BRUSH ON TEETH FOR 2 MINUTES THEN SWISH AND EXPECTORATE.   TEMAZEPAM (RESTORIL) 30 MG CAPSULE    Take one capsule nightly as needed for rest   TESTOSTERONE 20.25 MG/1.25GM (1.62%) GEL    Place 2 application onto the skin once. For testosterone replacement   TRAMADOL (ULTRAM) 50 MG TABLET    Take one tablet by mouth up to four times daily as needed for pain   VOLTAREN 1 % GEL    APPLY 2-4GM TO AFFECTED AREA UP TO 4 TIMES A DAY  Modified Medications   No medications on file  Discontinued Medications   DIAZEPAM (VALIUM) 10 MG TABLET    Take one tablet by mouth up to four times daily to relax muscles     Review of Systems  Constitutional: Positive for activity change. Negative for appetite change.  HENT:  Negative for congestion and rhinorrhea.   Eyes: Positive for visual disturbance.  Respiratory: Positive for cough and shortness of breath.        Producing sputum  Cardiovascular: Positive for leg swelling. Negative for chest pain and palpitations.       History of peripheral artery disease  Gastrointestinal:       History of chronic and recurrent pancreatitis. Loose stools from time to time. Upper GI distress and epigastric pain.  Endocrine: Positive for polydipsia.       History of diabetes mellitus.  Musculoskeletal: Positive for myalgias, back pain, joint swelling (Right knee), arthralgias, gait problem, neck pain and neck stiffness.       Pain in both hips.  Skin: Negative.   Allergic/Immunologic: Negative.   Neurological: Positive for dizziness, weakness and numbness.  Hematological:       History of anemia.  Psychiatric/Behavioral: Positive for dysphoric mood, decreased concentration and agitation. Negative for suicidal ideas, hallucinations, behavioral problems and confusion. The patient is nervous/anxious.     Filed Vitals:   12/14/14 1411  BP: 170/100  Pulse: 105  Temp: 100.7 F (38.2 C)  TempSrc: Oral  Resp: 22  Height: 5\' 9"  (1.753 m)  Weight: 200 lb 9.6 oz (90.992 kg)  SpO2: 97%   Body mass index is 29.61 kg/(m^2).  Physical Exam  Constitutional: He is oriented to person, place, and time.  Mesomorphic. Rambling in thought content. Chronically ill appearance.  HENT:  Head: Normocephalic and atraumatic.  Right Ear: External ear normal.  Left Ear: External ear normal.  Nose: Nose normal.  Mouth/Throat: Oropharynx is clear and moist.  Eyes: Conjunctivae and EOM are normal. Pupils are equal, round, and reactive to light.  Neck: No JVD present. No tracheal deviation present. No thyromegaly present.  Cardiovascular: Normal rate, regular rhythm and normal heart sounds.  Exam reveals no gallop and no friction rub.   No murmur heard. Pulmonary/Chest: No respiratory  distress. He has no wheezes. He has no rales.  Abdominal: He exhibits no distension and no mass. There is no tenderness.  Musculoskeletal: He exhibits edema and tenderness.  Muscular weakness and pains. Unstable gait. Uses walker. Velcro brace on the right knee. Ganglion cyst left wrist dorsum. Exquisitely tender to palpation at the left elbow malleolus.  Lymphadenopathy:    He has no cervical adenopathy.  Neurological: He is alert and oriented to person, place, and time. He displays abnormal reflex. No cranial nerve deficit. Coordination abnormal.  Skin: No rash noted. No erythema. There is pallor.  Psychiatric:  Rambling speech. Frustrated.  Vitals reviewed.    Labs reviewed: No visits with results within 3 Month(s) from this visit. Latest known visit with results is:  Office Visit on 01/27/2014  Component Date Value Ref Range Status  . WBC 01/27/2014 7.0  3.4 - 10.8 x10E3/uL Final  . RBC 01/27/2014 4.31  4.14 - 5.80 x10E6/uL Final  . Hemoglobin 01/27/2014 12.8  12.6 - 17.7 g/dL Final  . HCT 16/10/960407/15/2015 37.4* 37.5 - 51.0 % Final  . MCV 01/27/2014 87  79 - 97 fL Final  . MCH 01/27/2014 29.7  26.6 - 33.0 pg Final  . MCHC 01/27/2014 34.2  31.5 - 35.7 g/dL Final  . RDW 54/09/811907/15/2015 12.9  12.3 - 15.4 % Final  . Platelets 01/27/2014 365  150 - 379 x10E3/uL Final  . Neutrophils Relative % 01/27/2014 72   Final  . Lymphs 01/27/2014 20   Final  . Monocytes 01/27/2014 7   Final  . Eos 01/27/2014 1   Final  . Basos 01/27/2014 0   Final  . Neutrophils Absolute 01/27/2014 5.1  1.4 - 7.0 x10E3/uL Final  . Lymphocytes Absolute 01/27/2014 1.4  0.7 - 3.1 x10E3/uL Final  . Monocytes Absolute 01/27/2014 0.5  0.1 - 0.9 x10E3/uL Final  . Eosinophils Absolute 01/27/2014 0.0  0.0 - 0.4 x10E3/uL Final  . Basophils Absolute 01/27/2014 0.0  0.0 - 0.2 x10E3/uL Final  . Immature Granulocytes 01/27/2014 0   Final  . Immature Grans (Abs) 01/27/2014 0.0  0.0 - 0.1 x10E3/uL Final  . Glucose 01/27/2014 140*  65 - 99 mg/dL Final  . BUN 14/78/295607/15/2015 20  8 - 27 mg/dL Final  . Creatinine, Ser 01/27/2014 1.35* 0.76 - 1.27 mg/dL Final  . GFR calc non Af Amer 01/27/2014 56* >59 mL/min/1.73 Final  . GFR calc Af Amer 01/27/2014 65  >59 mL/min/1.73 Final  . BUN/Creatinine Ratio 01/27/2014 15  10 - 22 Final  . Sodium 01/27/2014 141  134 - 144 mmol/L Final  . Potassium 01/27/2014 4.5  3.5 - 5.2 mmol/L Final  . Chloride 01/27/2014 99  97 - 108 mmol/L Final  . CO2 01/27/2014 22  18 - 29 mmol/L Final  . Calcium 01/27/2014 10.0  8.6 - 10.2 mg/dL Final  . Total Protein 01/27/2014 7.3  6.0 - 8.5 g/dL Final  . Albumin 21/30/865707/15/2015 4.7  3.6 - 4.8 g/dL Final  . Globulin, Total 01/27/2014 2.6  1.5 - 4.5 g/dL Final  . Albumin/Globulin Ratio 01/27/2014 1.8  1.1 - 2.5 Final  . Total Bilirubin 01/27/2014 0.2  0.0 - 1.2 mg/dL Final  . Alkaline Phosphatase 01/27/2014 100  39 - 117 IU/L Final  . AST 01/27/2014 20  0 - 40 IU/L Final  . ALT 01/27/2014 16  0 - 44 IU/L Final  . Cholesterol, Total 01/27/2014 228* 100 - 199 mg/dL Final  . Triglycerides 01/27/2014 162* 0 - 149 mg/dL Final  . HDL 84/69/629507/15/2015 53  >39 mg/dL Final   Comment: According to ATP-III Guidelines, HDL-C >59 mg/dL is considered a                          negative risk factor for CHD.  Marland Kitchen. VLDL Cholesterol Cal 01/27/2014 32  5 - 40 mg/dL Final  . LDL Calculated 01/27/2014 284143* 0 - 99 mg/dL Final  . Chol/HDL Ratio 01/27/2014 4.3  0.0 - 5.0 ratio units Final   Comment:  T. Chol/HDL Ratio                                                                      Men  Women                                                        1/2 Avg.Risk  3.4    3.3                                                            Avg.Risk  5.0    4.4                                                         2X Avg.Risk  9.6    7.1                                                         3X Avg.Risk 23.4   11.0  . TSH 01/27/2014 2.080  0.450 - 4.500 uIU/mL  Final  . Hgb A1c MFr Bld 01/27/2014 6.0* 4.8 - 5.6 % Final   Comment:          Increased risk for diabetes: 5.7 - 6.4                                   Diabetes: >6.4                                   Glycemic control for adults with diabetes: <7.0  . Est. average glucose Bld gHb Est-m* 01/27/2014 126   Final  . Testosterone 01/27/2014 385  348 - 1197 ng/dL Final  . Comment, Testosterone 01/27/2014 Comment   Final   Comment: Adult male reference interval is based on a population of lean males                          up to 64 years old.     Assessment/Plan  1. Hypothyroidism, unspecified hypothyroidism type - TSH; Future - TSH - levothyroxine (SYNTHROID, LEVOTHROID) 50 MCG tablet; TAKE 1 TABLET EACH DAY FOR THYROID SUPPLEMENT  Dispense: 90 tablet; Refill: 3  2. 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 Testosterone insufficiency - Testosterone,Free and Total  4. Essential hypertension - minoxidil (LONITEN) 10 MG tablet; TAKE 1 TABLET DAILY TO CONTROL BLOOD PRESSURE.  Dispense: 90 tablet; Refill: 3 - losartan (COZAAR) 50 MG tablet; One daily to control BP  Dispense: 90 tablet; Refill: 3  5. Chronic pain Lengthy discussion today regarding his use of pain medications. The chronicity of his pain problems has led to a tolerance to narcotics. He is opposed to going to a pain clinic.  - methadone (DOLOPHINE) 10 MG tablet; Take four tablets every 6 hours to control pain  Dispense: 480 tablet; Refill: 0 - Oxycodone HCl 10 MG TABS; Take one every 4 hours if needed for pain  Dispense: 480 tablet; Refill: 0 - diazepam (VALIUM) 10 MG tablet; TAKE 1 TABLET UP TO FOUR TIMES DAILY TO RELAX MUSCLES.  Dispense: 120 tablet; Refill: 0 - traMADol (ULTRAM) 50 MG tablet; Take one tablet by mouth up to four times daily as needed for pain  Dispense: 120 tablet; Refill:  0 - nitroGLYCERIN (NITRO-BID) 2 % ointment; APPLY 1 INCH STRIP TO FEET TWICE DAILY.  Dispense: 60 g; Refill: 5 - diclofenac sodium (VOLTAREN) 1 % GEL; APPLY 2-4GM TO AFFECTED AREA UP TO 4 TIMES A DAY  Dispense: 300 g; Refill: 3  6. Hyperlipidemia Follow-up lipid panel in the future  7. Anemia, unspecified anemia type - CBC With Differential; Future - CBC With Differential  8. Arthritis Multiple joints involved. He does not want to see a rheumatologist.  9. Adhesive arachnoiditis Reevaluate with radiology. Concerns about the left arm becoming weaker. - MR Cervical Spine Wo Contrast; Future - MR Thoracic Spine Wo Contrast; Future  10. Fever, unspecified fever cause Low-grade fever today of uncertain origin. -Urinalysis and culture  11. Chronic pancreatitis, unspecified pancreatitis type - Comprehensive metabolic panel; Future - Lipase - Amylase - Comprehensive metabolic panel  12. Cough Persistent cough. Previously attributed to allergies, which I suspect is still the main issue. He continues to use hydrocodone cough medication.  13. Peripheral vascular disease No evidence of gangrenous changes in the feet or ulcerations at this time. Unable to palpate dorsalis pedis or posterior tibial pulses in either foot.  14. Reflux Recurrent issue for which he uses antiacids as well as acid blocking agents.

## 2014-12-15 ENCOUNTER — Other Ambulatory Visit: Payer: Self-pay | Admitting: Internal Medicine

## 2014-12-15 LAB — COMPREHENSIVE METABOLIC PANEL
A/G RATIO: 1.6 (ref 1.1–2.5)
ALBUMIN: 4.6 g/dL (ref 3.6–4.8)
ALT: 14 IU/L (ref 0–44)
AST: 21 IU/L (ref 0–40)
Alkaline Phosphatase: 95 IU/L (ref 39–117)
BUN/Creatinine Ratio: 14 (ref 10–22)
BUN: 17 mg/dL (ref 8–27)
Bilirubin Total: <0.2 mg/dL (ref 0.0–1.2)
CHLORIDE: 96 mmol/L — AB (ref 97–108)
CO2: 25 mmol/L (ref 18–29)
Calcium: 10 mg/dL (ref 8.6–10.2)
Creatinine, Ser: 1.22 mg/dL (ref 0.76–1.27)
GFR calc Af Amer: 73 mL/min/{1.73_m2} (ref >59)
GFR, EST NON AFRICAN AMERICAN: 63 mL/min/{1.73_m2} (ref >59)
GLUCOSE: 111 mg/dL — AB (ref 65–99)
Globulin, Total: 2.8 g/dL (ref 1.5–4.5)
Potassium: 4.9 mmol/L (ref 3.5–5.2)
SODIUM: 139 mmol/L (ref 134–144)
Total Protein: 7.4 g/dL (ref 6.0–8.5)

## 2014-12-15 LAB — CBC WITH DIFFERENTIAL
Basophils Absolute: 0 10*3/uL (ref 0.0–0.2)
Basos: 0 %
EOS (ABSOLUTE): 0 10*3/uL (ref 0.0–0.4)
Eos: 0 %
HEMOGLOBIN: 12.4 g/dL — AB (ref 12.6–17.7)
Hematocrit: 36.1 % — ABNORMAL LOW (ref 37.5–51.0)
IMMATURE GRANULOCYTES: 0 %
Immature Grans (Abs): 0 10*3/uL (ref 0.0–0.1)
Lymphocytes Absolute: 1.2 10*3/uL (ref 0.7–3.1)
Lymphs: 17 %
MCH: 29.9 pg (ref 26.6–33.0)
MCHC: 34.3 g/dL (ref 31.5–35.7)
MCV: 87 fL (ref 79–97)
MONOS ABS: 0.5 10*3/uL (ref 0.1–0.9)
Monocytes: 6 %
NEUTROS ABS: 5.5 10*3/uL (ref 1.4–7.0)
NEUTROS PCT: 77 %
RBC: 4.15 x10E6/uL (ref 4.14–5.80)
RDW: 13.5 % (ref 12.3–15.4)
WBC: 7.3 10*3/uL (ref 3.4–10.8)

## 2014-12-15 LAB — HEMOGLOBIN A1C
Est. average glucose Bld gHb Est-mCnc: 120 mg/dL
Hgb A1c MFr Bld: 5.8 % — ABNORMAL HIGH (ref 4.8–5.6)

## 2014-12-15 LAB — AMYLASE: Amylase: 77 U/L (ref 31–124)

## 2014-12-15 LAB — TESTOSTERONE,FREE AND TOTAL
TESTOSTERONE FREE: 10.1 pg/mL (ref 6.6–18.1)
Testosterone: 847 ng/dL (ref 348–1197)

## 2014-12-15 LAB — TSH: TSH: 3.89 u[IU]/mL (ref 0.450–4.500)

## 2014-12-15 LAB — LIPASE: LIPASE: 44 U/L (ref 0–59)

## 2014-12-15 MED ORDER — HYDROCODONE-ACETAMINOPHEN 7.5-325 MG PO TABS: ORAL_TABLET | ORAL | Status: DC

## 2014-12-16 NOTE — Telephone Encounter (Signed)
Lidocaine was denied. Information was given to Dr.Green while patient in office at last OV

## 2014-12-17 ENCOUNTER — Other Ambulatory Visit: Payer: Self-pay | Admitting: *Deleted

## 2014-12-17 DIAGNOSIS — R05 Cough: Secondary | ICD-10-CM

## 2014-12-17 DIAGNOSIS — R059 Cough, unspecified: Secondary | ICD-10-CM

## 2014-12-17 MED ORDER — HYDROCODONE-ACETAMINOPHEN 7.5-325 MG PO TABS: ORAL_TABLET | ORAL | Status: DC

## 2014-12-17 NOTE — Telephone Encounter (Signed)
Pharmacy sent over requested wanting Liquid pain medication for cough to be changed to a tablet per patient request. Dr Chilton SiGreen changed in the system and printed Rx and faxed to pharmacy but the pharmacy needs hard copy and for patient to pick up. Pharmacy will have patient pick up and Rx reprinted on hard paper.

## 2014-12-21 ENCOUNTER — Other Ambulatory Visit: Payer: Medicare Other

## 2014-12-21 ENCOUNTER — Other Ambulatory Visit: Payer: Self-pay | Admitting: *Deleted

## 2014-12-21 DIAGNOSIS — R509 Fever, unspecified: Secondary | ICD-10-CM

## 2014-12-22 LAB — URINE CULTURE

## 2014-12-22 LAB — URINALYSIS
Bilirubin, UA: NEGATIVE
GLUCOSE, UA: NEGATIVE
Ketones, UA: NEGATIVE
Leukocytes, UA: NEGATIVE
Nitrite, UA: NEGATIVE
PROTEIN UA: NEGATIVE
RBC, UA: NEGATIVE
SPEC GRAV UA: 1.019 (ref 1.005–1.030)
UUROB: 0.2 mg/dL (ref 0.2–1.0)
pH, UA: 5.5 (ref 5.0–7.5)

## 2015-01-12 ENCOUNTER — Other Ambulatory Visit: Payer: Self-pay | Admitting: Internal Medicine

## 2015-01-12 ENCOUNTER — Other Ambulatory Visit: Payer: Self-pay | Admitting: *Deleted

## 2015-01-12 MED ORDER — TEMAZEPAM 30 MG PO CAPS: ORAL_CAPSULE | ORAL | Status: DC

## 2015-01-12 NOTE — Telephone Encounter (Signed)
[facility]  

## 2015-01-13 ENCOUNTER — Other Ambulatory Visit: Payer: Self-pay | Admitting: *Deleted

## 2015-01-13 ENCOUNTER — Other Ambulatory Visit: Payer: Self-pay | Admitting: Internal Medicine

## 2015-01-13 DIAGNOSIS — G8929 Other chronic pain: Secondary | ICD-10-CM

## 2015-01-13 DIAGNOSIS — R05 Cough: Secondary | ICD-10-CM

## 2015-01-13 DIAGNOSIS — R059 Cough, unspecified: Secondary | ICD-10-CM

## 2015-01-13 MED ORDER — OXYCODONE HCL 10 MG PO TABS: ORAL_TABLET | ORAL | Status: DC

## 2015-01-13 MED ORDER — METHADONE HCL 10 MG PO TABS: ORAL_TABLET | ORAL | Status: DC

## 2015-01-13 MED ORDER — HYDROCODONE-ACETAMINOPHEN 7.5-325 MG PO TABS: ORAL_TABLET | ORAL | Status: DC

## 2015-01-13 MED ORDER — OXYCODONE HCL 10 MG PO TABS
ORAL_TABLET | ORAL | Status: DC
Start: 2015-01-13 — End: 2015-02-11

## 2015-01-13 NOTE — Telephone Encounter (Signed)
Patient Requested and will pick up 

## 2015-01-21 ENCOUNTER — Other Ambulatory Visit: Payer: Self-pay | Admitting: *Deleted

## 2015-01-21 MED ORDER — TESTOSTERONE 20.25 MG/ACT (1.62%) TD GEL: TRANSDERMAL | Status: DC

## 2015-02-03 ENCOUNTER — Telehealth: Payer: Self-pay | Admitting: Internal Medicine

## 2015-02-03 NOTE — Telephone Encounter (Signed)
FYI   Note from [facility], they have called pt several times to schedule the  MR cervical spine & MR thoracic spine that you ordered and pt declined scheduling because he disputes what was ordered

## 2015-02-10 ENCOUNTER — Other Ambulatory Visit: Payer: Self-pay | Admitting: Internal Medicine

## 2015-02-11 ENCOUNTER — Other Ambulatory Visit: Payer: Self-pay | Admitting: Internal Medicine

## 2015-02-11 ENCOUNTER — Other Ambulatory Visit: Payer: Self-pay | Admitting: *Deleted

## 2015-02-11 DIAGNOSIS — R05 Cough: Secondary | ICD-10-CM

## 2015-02-11 DIAGNOSIS — R059 Cough, unspecified: Secondary | ICD-10-CM

## 2015-02-11 DIAGNOSIS — G8929 Other chronic pain: Secondary | ICD-10-CM

## 2015-02-11 MED ORDER — METHADONE HCL 10 MG PO TABS: ORAL_TABLET | ORAL | Status: DC

## 2015-02-11 MED ORDER — HYDROCODONE-ACETAMINOPHEN 7.5-325 MG PO TABS: ORAL_TABLET | ORAL | Status: DC

## 2015-02-11 MED ORDER — OXYCODONE HCL 10 MG PO TABS
ORAL_TABLET | ORAL | Status: DC
Start: 2015-02-11 — End: 2015-03-11

## 2015-02-11 NOTE — Telephone Encounter (Signed)
Patient requested and will pick up 

## 2015-03-01 ENCOUNTER — Other Ambulatory Visit: Payer: Self-pay | Admitting: Internal Medicine

## 2015-03-10 ENCOUNTER — Other Ambulatory Visit: Payer: Self-pay

## 2015-03-10 MED ORDER — TEMAZEPAM 30 MG PO CAPS: ORAL_CAPSULE | ORAL | Status: DC

## 2015-03-10 NOTE — Telephone Encounter (Signed)
Manually faxed to [facility]

## 2015-03-11 ENCOUNTER — Other Ambulatory Visit: Payer: Self-pay

## 2015-03-11 ENCOUNTER — Other Ambulatory Visit: Payer: Self-pay | Admitting: Nurse Practitioner

## 2015-03-11 DIAGNOSIS — R05 Cough: Secondary | ICD-10-CM

## 2015-03-11 DIAGNOSIS — G8929 Other chronic pain: Secondary | ICD-10-CM

## 2015-03-11 DIAGNOSIS — R059 Cough, unspecified: Secondary | ICD-10-CM

## 2015-03-11 MED ORDER — METHADONE HCL 10 MG PO TABS
ORAL_TABLET | ORAL | Status: DC
Start: 2015-03-11 — End: 2015-04-08

## 2015-03-11 MED ORDER — DIAZEPAM 10 MG PO TABS: ORAL_TABLET | ORAL | Status: DC

## 2015-03-11 MED ORDER — OXYCODONE HCL 10 MG PO TABS: ORAL_TABLET | ORAL | Status: DC

## 2015-03-11 MED ORDER — HYDROCODONE-ACETAMINOPHEN 7.5-325 MG PO TABS: ORAL_TABLET | ORAL | Status: DC

## 2015-03-11 NOTE — Telephone Encounter (Signed)
Patient called for refill on Rx's

## 2015-03-20 ENCOUNTER — Other Ambulatory Visit: Payer: Self-pay | Admitting: Internal Medicine

## 2015-03-23 ENCOUNTER — Encounter: Payer: Self-pay | Admitting: Internal Medicine

## 2015-03-23 ENCOUNTER — Ambulatory Visit (INDEPENDENT_AMBULATORY_CARE_PROVIDER_SITE_OTHER): Payer: Medicare Other | Admitting: Internal Medicine

## 2015-03-23 VITALS — BP 142/92 | HR 98 | Temp 98.3°F | Resp 20 | Ht 69.0 in | Wt 209.6 lb

## 2015-03-23 DIAGNOSIS — E785 Hyperlipidemia, unspecified: Secondary | ICD-10-CM | POA: Diagnosis not present

## 2015-03-23 DIAGNOSIS — E349 Endocrine disorder, unspecified: Secondary | ICD-10-CM

## 2015-03-23 DIAGNOSIS — I10 Essential (primary) hypertension: Secondary | ICD-10-CM | POA: Diagnosis not present

## 2015-03-23 DIAGNOSIS — E291 Testicular hypofunction: Secondary | ICD-10-CM | POA: Diagnosis not present

## 2015-03-23 DIAGNOSIS — G8929 Other chronic pain: Secondary | ICD-10-CM | POA: Diagnosis not present

## 2015-03-23 DIAGNOSIS — E1151 Type 2 diabetes mellitus with diabetic peripheral angiopathy without gangrene: Secondary | ICD-10-CM | POA: Diagnosis not present

## 2015-03-23 DIAGNOSIS — G039 Meningitis, unspecified: Secondary | ICD-10-CM

## 2015-03-23 DIAGNOSIS — I798 Other disorders of arteries, arterioles and capillaries in diseases classified elsewhere: Secondary | ICD-10-CM | POA: Diagnosis not present

## 2015-03-23 MED ORDER — LIDOCAINE 5 % EX OINT: TOPICAL_OINTMENT | CUTANEOUS | Status: DC

## 2015-03-23 MED ORDER — METHOCARBAMOL 500 MG PO TABS: ORAL_TABLET | ORAL | Status: DC

## 2015-03-23 NOTE — Progress Notes (Signed)
Patient ID: Austin Barnes, male   DOB: [birth year], 64 y.o.   MRN: [id number]    Facility  [facility]    Place of Service:   OFFICE    Allergies  Allergen Reactions  . Codeine   . Demerol   . Disalcid [Salsalate]   . Feldene [Piroxicam]   . Penicillins   . Sulfa Antibiotics     Chief Complaint  Patient presents with  . Medical Management of Chronic Issues    3 month follow-up    HPI:  Claims he had a fever of 101 this morning. Diaphoretic. Better now.  Adhesive arachnoiditis - terrible pain in the back  Chronic pain - related to arachnoiditis. Refuses to consider seeing a pain specialist.  Essential hypertension - mild elevation of the SBP and DBP  Testosterone insufficiency - normal testosterone 12/14/14 at 847 ng/ml    Medications: Patient's Medications  New Prescriptions   No medications on file  Previous Medications   ALUM & MAG HYDROXIDE-SIMETH (MAGIC MOUTHWASH) SOLN    Swish and swallow one teaspoonful four times daily **shake well**   ANDROGEL PUMP 20.25 MG/ACT (1.62%) GEL    APPLY 2 PUMP ACTUATIONS DAILY FOR TESTOSTERONE REPLACEMENT.   CAMPHOR-MENTHOL-METHYL SAL (SALONPAS) 1.2-5.7-6.3 % PTCH    Apply daily for pain relief   CARISOPRODOL (SOMA) 350 MG TABLET    Take one tablet by mouth four times daily as needed for muscle spasm   DIAZEPAM (VALIUM) 10 MG TABLET    TAKE 1 TABLET UP TO FOUR TIMES DAILY TO RELAX MUSCLES.   DICLOFENAC SODIUM (VOLTAREN) 1 % GEL    APPLY 2-4GM TO AFFECTED AREA UP TO 4 TIMES A DAY   DIPHENHYD-HYDROCORT-NYSTATIN SUSP    Swish ans Swallow 5cc four times a day as needed   HYDROCODONE-ACETAMINOPHEN (NORCO) 7.5-325 MG PER TABLET    Take one tablet every 6 hours if needed for cough   LEVOTHYROXINE (SYNTHROID, LEVOTHROID) 50 MCG TABLET    TAKE 1 TABLET EACH DAY FOR THYROID SUPPLEMENT   LIDOCAINE (LIDODERM) 5 %    Place 1 patch onto the skin daily. Remove & Discard patch within 12 hours or as directed by MD   LOSARTAN (COZAAR) 50 MG TABLET    One  daily to control BP   METHADONE (DOLOPHINE) 10 MG TABLET    Take four tablets every 6 hours to control pain   MINOXIDIL (LONITEN) 10 MG TABLET    TAKE 1 TABLET DAILY TO CONTROL BLOOD PRESSURE.   NITROGLYCERIN (NITRO-BID) 2 % OINTMENT    APPLY 1 INCH STRIP TO FEET TWICE DAILY.   OMEPRAZOLE (PRILOSEC) 20 MG CAPSULE    TAKE (2) CAPSULES TWICE DAILY.   OXYCODONE HCL 10 MG TABS    Take four tablets by mouth every 6 hours as needed for pain   SF 5000 PLUS 1.1 % CREA DENTAL CREAM    BRUSH ON TEETH FOR 2 MINUTES THEN SWISH AND EXPECTORATE.   TEMAZEPAM (RESTORIL) 30 MG CAPSULE    TAKE 1 CAPSULE NIGHTLY IF NEEDED FOR REST.   TRAMADOL (ULTRAM) 50 MG TABLET    TAKE 1 TABLET UP TO FOUR TIMES DAILY AS NEEDED FOR PAIN.  Modified Medications   No medications on file  Discontinued Medications   TESTOSTERONE 20.25 MG/1.25GM (1.62%) GEL    Place 2 application onto the skin once. For testosterone replacement     Review of Systems  Constitutional: Positive for activity change. Negative for appetite change.  HENT: Negative for congestion and  rhinorrhea.   Eyes: Positive for visual disturbance.  Respiratory: Positive for cough and shortness of breath.        Producing sputum  Cardiovascular: Positive for leg swelling. Negative for chest pain and palpitations.       History of peripheral artery disease  Gastrointestinal:       History of chronic and recurrent pancreatitis. Loose stools from time to time. Upper GI distress and epigastric pain.  Endocrine: Positive for polydipsia.       History of diabetes mellitus.  Musculoskeletal: Positive for myalgias, back pain, joint swelling (Right knee), arthralgias, gait problem, neck pain and neck stiffness.       Pain in both hips.  Skin: Negative.   Allergic/Immunologic: Negative.   Neurological: Positive for dizziness, weakness and numbness.  Hematological:       History of anemia.  Psychiatric/Behavioral: Positive for dysphoric mood, decreased concentration  and agitation. Negative for suicidal ideas, hallucinations, behavioral problems and confusion. The patient is nervous/anxious.     Filed Vitals:   03/23/15 1213  BP: 142/92  Pulse: 98  Temp: 98.3 F (36.8 C)  TempSrc: Oral  Resp: 20  Height: 5' 9"  (1.753 m)  Weight: 209 lb 9.6 oz (95.074 kg)  SpO2: 98%   Body mass index is 30.94 kg/(m^2).  Physical Exam  Constitutional: He is oriented to person, place, and time.  Mesomorphic. Rambling in thought content. Chronically ill appearance.  HENT:  Head: Normocephalic and atraumatic.  Right Ear: External ear normal.  Left Ear: External ear normal.  Nose: Nose normal.  Mouth/Throat: Oropharynx is clear and moist.  Eyes: Conjunctivae and EOM are normal. Pupils are equal, round, and reactive to light.  Neck: No JVD present. No tracheal deviation present. No thyromegaly present.  Cardiovascular: Normal rate, regular rhythm and normal heart sounds.  Exam reveals no gallop and no friction rub.   No murmur heard. Pulmonary/Chest: No respiratory distress. He has no wheezes. He has no rales.  Abdominal: He exhibits no distension and no mass. There is no tenderness.  Musculoskeletal: He exhibits edema and tenderness.  Muscular weakness and pains. Unstable gait. Uses walker. Velcro brace on the right knee. Ganglion cyst left wrist dorsum. Exquisitely tender to palpation at the left elbow malleolus.  Lymphadenopathy:    He has no cervical adenopathy.  Neurological: He is alert and oriented to person, place, and time. He displays abnormal reflex. No cranial nerve deficit. Coordination abnormal.  Skin: No rash noted. No erythema. There is pallor.  Psychiatric:  Rambling speech. Frustrated.  Vitals reviewed.    Labs reviewed: Lab Summary Latest Ref Rng 12/14/2014  Hemoglobin 12.6 - 17.7 g/dL 12.4(L)  Hematocrit 37.5 - 51.0 % 36.1(L)  White count 3.4 - 10.8 x10E3/uL 7.3  Platelet count - (None)  Sodium 134 - 144 mmol/L 139  Potassium 3.5 -  5.2 mmol/L 4.9  Calcium 8.6 - 10.2 mg/dL 10.0  Phosphorus - (None)  Creatinine 0.76 - 1.27 mg/dL 1.22  AST 0 - 40 IU/L 21  Alk Phos 39 - 117 IU/L 95  Bilirubin 0.0 - 1.2 mg/dL <0.2  Glucose 65 - 99 mg/dL 111(H)  Cholesterol - (None)  HDL cholesterol - (None)  Triglycerides - (None)  LDL Direct - (None)  LDL Calc - (None)  Total protein - (None)  Albumin 3.6 - 4.8 g/dL 4.6   Lab Results  Component Value Date   TSH 3.890 12/14/2014   Lab Results  Component Value Date   BUN 17 12/14/2014  Lab Results  Component Value Date   HGBA1C 5.8* 12/14/2014    Assessment/Plan  1. Adhesive arachnoiditis Continue current narcotics Claims carisoprodol is no longer covered by his insurance except a $70 for each prescription. He would like a different medication. - methocarbamol (ROBAXIN) 500 MG tablet; One up to 4 times daily as needed to relax muscles  Dispense: 120 tablet; Refill: 3  2. Chronic pain Attending current narcotics  - methocarbamol (ROBAXIN) 500 MG tablet; One up to 4 times daily as needed to relax muscles  Dispense: 120 tablet; Refill: 3  3. Essential hypertension Mild elevation of SBP. Patient chronic pain. Reports some dizzy spells. I'm hesitant to increase any hypertensive drugs. - Comprehensive metabolic panel; Future  4. Testosterone insufficiency Continue current testosterone  5. 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 Hyperlipidemia - Lipid panel; Future

## 2015-03-24 NOTE — Telephone Encounter (Signed)
Patient was seen in the office 03/23/15. He has elected not to pursue further imaging at this time due to financial constraints.

## 2015-03-29 ENCOUNTER — Other Ambulatory Visit: Payer: Self-pay | Admitting: Internal Medicine

## 2015-03-30 ENCOUNTER — Telehealth: Payer: Self-pay | Admitting: *Deleted

## 2015-03-30 MED ORDER — LIDOCAINE 5 % EX OINT: TOPICAL_OINTMENT | CUTANEOUS | Status: DC

## 2015-03-30 NOTE — Telephone Encounter (Signed)
Austin Barnes with [facility] called and stated that patient is calling for more Lidocaine Creme refill and it has been less than a week since the last refill. Pharmacist wants to know how long you want this to last. They are concerned with how much patient is using. Please Advise.  I spoke with Dr. Chilton Si and he stated to give patient 5 tubes to last him a month due to the areas he is using it on.

## 2015-04-04 ENCOUNTER — Telehealth: Payer: Self-pay

## 2015-04-04 NOTE — Telephone Encounter (Signed)
Faxed received from pharmacy to initiate PA for Methocarbamol 500 mg  I called [phone], ID [id number], diagnosis G03.9 and G89.29. Paper work to be faxed, completed and faxed back. Once response for coverage received from the insurance company we will inform the pharmacy.

## 2015-04-06 ENCOUNTER — Encounter: Payer: Self-pay | Admitting: *Deleted

## 2015-04-07 ENCOUNTER — Other Ambulatory Visit: Payer: Self-pay | Admitting: Internal Medicine

## 2015-04-07 DIAGNOSIS — M62838 Other muscle spasm: Secondary | ICD-10-CM

## 2015-04-07 MED ORDER — BACLOFEN 10 MG PO TABS: ORAL_TABLET | ORAL | Status: DC

## 2015-04-08 ENCOUNTER — Other Ambulatory Visit: Payer: Self-pay | Admitting: Internal Medicine

## 2015-04-08 ENCOUNTER — Other Ambulatory Visit: Payer: Self-pay | Admitting: *Deleted

## 2015-04-08 DIAGNOSIS — G8929 Other chronic pain: Secondary | ICD-10-CM

## 2015-04-08 DIAGNOSIS — R059 Cough, unspecified: Secondary | ICD-10-CM

## 2015-04-08 DIAGNOSIS — R05 Cough: Secondary | ICD-10-CM

## 2015-04-08 MED ORDER — OXYCODONE HCL 10 MG PO TABS: ORAL_TABLET | ORAL | Status: DC

## 2015-04-08 MED ORDER — HYDROCODONE-ACETAMINOPHEN 7.5-325 MG PO TABS: ORAL_TABLET | ORAL | Status: DC

## 2015-04-08 MED ORDER — DIAZEPAM 10 MG PO TABS: ORAL_TABLET | ORAL | Status: DC

## 2015-04-08 MED ORDER — METHADONE HCL 10 MG PO TABS: ORAL_TABLET | ORAL | Status: DC

## 2015-04-08 NOTE — Telephone Encounter (Signed)
Patient Requested and will pick up 

## 2015-04-14 ENCOUNTER — Other Ambulatory Visit: Payer: Self-pay | Admitting: *Deleted

## 2015-04-14 MED ORDER — TEMAZEPAM 30 MG PO CAPS: ORAL_CAPSULE | ORAL | Status: DC

## 2015-04-14 NOTE — Telephone Encounter (Signed)
[facility]  

## 2015-04-22 ENCOUNTER — Other Ambulatory Visit: Payer: Self-pay | Admitting: *Deleted

## 2015-04-22 ENCOUNTER — Other Ambulatory Visit: Payer: Self-pay | Admitting: Nurse Practitioner

## 2015-04-22 MED ORDER — TESTOSTERONE 20.25 MG/ACT (1.62%) TD GEL: TRANSDERMAL | Status: DC

## 2015-04-28 ENCOUNTER — Other Ambulatory Visit: Payer: Self-pay | Admitting: *Deleted

## 2015-04-28 MED ORDER — LIDOCAINE 5 % EX OINT: TOPICAL_OINTMENT | CUTANEOUS | Status: DC

## 2015-04-28 NOTE — Telephone Encounter (Signed)
[facility]  

## 2015-04-29 ENCOUNTER — Other Ambulatory Visit: Payer: Self-pay | Admitting: *Deleted

## 2015-04-29 MED ORDER — LIDOCAINE 5 % EX OINT: TOPICAL_OINTMENT | CUTANEOUS | Status: DC

## 2015-04-29 NOTE — Telephone Encounter (Signed)
[facility] and stated that Rx had to state dispense 5 tubes for 30 days for insurance to cover. Refaxed.

## 2015-05-05 ENCOUNTER — Other Ambulatory Visit: Payer: Self-pay | Admitting: *Deleted

## 2015-05-05 ENCOUNTER — Other Ambulatory Visit: Payer: Self-pay | Admitting: Internal Medicine

## 2015-05-05 DIAGNOSIS — G8929 Other chronic pain: Secondary | ICD-10-CM

## 2015-05-05 MED ORDER — DIAZEPAM 10 MG PO TABS: ORAL_TABLET | ORAL | Status: DC

## 2015-05-06 ENCOUNTER — Other Ambulatory Visit: Payer: Self-pay | Admitting: Internal Medicine

## 2015-05-06 ENCOUNTER — Other Ambulatory Visit: Payer: Self-pay | Admitting: *Deleted

## 2015-05-06 DIAGNOSIS — R059 Cough, unspecified: Secondary | ICD-10-CM

## 2015-05-06 DIAGNOSIS — R05 Cough: Secondary | ICD-10-CM

## 2015-05-06 DIAGNOSIS — G8929 Other chronic pain: Secondary | ICD-10-CM

## 2015-05-06 MED ORDER — HYDROCODONE-ACETAMINOPHEN 7.5-325 MG PO TABS: ORAL_TABLET | ORAL | Status: DC

## 2015-05-06 MED ORDER — OXYCODONE HCL 10 MG PO TABS: ORAL_TABLET | ORAL | Status: DC

## 2015-05-06 MED ORDER — METHADONE HCL 10 MG PO TABS: ORAL_TABLET | ORAL | Status: DC

## 2015-05-06 NOTE — Telephone Encounter (Signed)
Patient requested and will pick up 

## 2015-05-10 ENCOUNTER — Other Ambulatory Visit: Payer: Self-pay | Admitting: Internal Medicine

## 2015-05-10 NOTE — Telephone Encounter (Signed)
Left message on voicemail for patient informing him he is due for an appointment in January 2017, patient instructed to call to schedule.   No current controlled substance contract on file, please advise on refill request

## 2015-05-11 ENCOUNTER — Other Ambulatory Visit: Payer: Self-pay | Admitting: Internal Medicine

## 2015-05-11 NOTE — Telephone Encounter (Signed)
It is OK to refill the tramadol. He can sign the pain medication contract next time he is in the office.

## 2015-05-11 NOTE — Telephone Encounter (Signed)
It looks like his prescription may have been refused. I did approve it.

## 2015-05-12 ENCOUNTER — Other Ambulatory Visit: Payer: Self-pay | Admitting: Internal Medicine

## 2015-05-19 ENCOUNTER — Other Ambulatory Visit: Payer: Self-pay | Admitting: Internal Medicine

## 2015-05-20 ENCOUNTER — Other Ambulatory Visit: Payer: Self-pay | Admitting: Internal Medicine

## 2015-05-24 ENCOUNTER — Other Ambulatory Visit: Payer: Self-pay | Admitting: *Deleted

## 2015-05-24 MED ORDER — TESTOSTERONE 20.25 MG/ACT (1.62%) TD GEL: TRANSDERMAL | Status: DC

## 2015-05-24 NOTE — Telephone Encounter (Signed)
[facility]  

## 2015-06-02 ENCOUNTER — Other Ambulatory Visit: Payer: Self-pay | Admitting: *Deleted

## 2015-06-02 MED ORDER — DIAZEPAM 10 MG PO TABS: ORAL_TABLET | ORAL | Status: DC

## 2015-06-02 NOTE — Telephone Encounter (Signed)
[facility]  

## 2015-06-03 ENCOUNTER — Other Ambulatory Visit: Payer: Self-pay

## 2015-06-03 DIAGNOSIS — G8929 Other chronic pain: Secondary | ICD-10-CM

## 2015-06-03 DIAGNOSIS — R059 Cough, unspecified: Secondary | ICD-10-CM

## 2015-06-03 DIAGNOSIS — R05 Cough: Secondary | ICD-10-CM

## 2015-06-03 MED ORDER — OXYCODONE HCL 10 MG PO TABS: ORAL_TABLET | ORAL | Status: DC

## 2015-06-03 MED ORDER — HYDROCODONE-ACETAMINOPHEN 7.5-325 MG PO TABS: ORAL_TABLET | ORAL | Status: DC

## 2015-06-03 MED ORDER — METHADONE HCL 10 MG PO TABS: ORAL_TABLET | ORAL | Status: DC

## 2015-06-07 ENCOUNTER — Other Ambulatory Visit: Payer: Self-pay | Admitting: Internal Medicine

## 2015-06-08 ENCOUNTER — Other Ambulatory Visit: Payer: Self-pay | Admitting: Internal Medicine

## 2015-06-10 ENCOUNTER — Other Ambulatory Visit: Payer: Self-pay | Admitting: Internal Medicine

## 2015-06-18 ENCOUNTER — Other Ambulatory Visit: Payer: Self-pay | Admitting: Internal Medicine

## 2015-06-20 ENCOUNTER — Other Ambulatory Visit: Payer: Self-pay | Admitting: Nurse Practitioner

## 2015-06-20 ENCOUNTER — Other Ambulatory Visit: Payer: Self-pay | Admitting: Internal Medicine

## 2015-06-20 MED ORDER — LIDOCAINE 5 % EX OINT: TOPICAL_OINTMENT | CUTANEOUS | Status: DC

## 2015-06-20 NOTE — Telephone Encounter (Signed)
[facility]  

## 2015-06-28 ENCOUNTER — Encounter (HOSPITAL_COMMUNITY): Payer: Self-pay | Admitting: Emergency Medicine

## 2015-06-28 ENCOUNTER — Emergency Department (HOSPITAL_COMMUNITY)
Admission: EM | Admit: 2015-06-28 | Discharge: 2015-06-28 | Disposition: A | Discharge status: Home / Self Care | Payer: Medicare Other | Attending: Emergency Medicine | Admitting: Emergency Medicine

## 2015-06-28 DIAGNOSIS — I10 Essential (primary) hypertension: Secondary | ICD-10-CM | POA: Diagnosis not present

## 2015-06-28 DIAGNOSIS — M79606 Pain in leg, unspecified: Secondary | ICD-10-CM | POA: Diagnosis not present

## 2015-06-28 DIAGNOSIS — M1990 Unspecified osteoarthritis, unspecified site: Secondary | ICD-10-CM | POA: Insufficient documentation

## 2015-06-28 DIAGNOSIS — G8929 Other chronic pain: Secondary | ICD-10-CM

## 2015-06-28 DIAGNOSIS — E039 Hypothyroidism, unspecified: Secondary | ICD-10-CM | POA: Insufficient documentation

## 2015-06-28 DIAGNOSIS — Z87891 Personal history of nicotine dependence: Secondary | ICD-10-CM | POA: Diagnosis not present

## 2015-06-28 DIAGNOSIS — Z86718 Personal history of other venous thrombosis and embolism: Secondary | ICD-10-CM | POA: Diagnosis not present

## 2015-06-28 DIAGNOSIS — Z880 Allergy status to penicillin: Secondary | ICD-10-CM | POA: Diagnosis not present

## 2015-06-28 DIAGNOSIS — R011 Cardiac murmur, unspecified: Secondary | ICD-10-CM | POA: Insufficient documentation

## 2015-06-28 DIAGNOSIS — Z79899 Other long term (current) drug therapy: Secondary | ICD-10-CM | POA: Insufficient documentation

## 2015-06-28 DIAGNOSIS — F419 Anxiety disorder, unspecified: Secondary | ICD-10-CM | POA: Insufficient documentation

## 2015-06-28 DIAGNOSIS — R079 Chest pain, unspecified: Secondary | ICD-10-CM | POA: Diagnosis present

## 2015-06-28 DIAGNOSIS — D649 Anemia, unspecified: Secondary | ICD-10-CM | POA: Diagnosis not present

## 2015-06-28 DIAGNOSIS — R52 Pain, unspecified: Secondary | ICD-10-CM | POA: Diagnosis not present

## 2015-06-28 DIAGNOSIS — R0789 Other chest pain: Secondary | ICD-10-CM | POA: Diagnosis not present

## 2015-06-28 NOTE — ED Provider Notes (Signed)
CSN: [id number]     Arrival date & time 06/28/15  1229 History   First MD Initiated Contact with Patient 06/28/15 1247     Chief Complaint  Patient presents with  . Generalized Body Aches  . Pain  . Chest Pain     (Consider location/radiation/quality/duration/timing/severity/associated sxs/prior Treatment) HPI   Austin Barnes is a 64 y.o. male who presents for evaluation of cold and painful legs. He has chronic pain. He has had problems with his legs for many years, his doctor currently treats her with nitroglycerin gel for the discomfort. He does not currently see a vascular doctor, but has been told that he has severe vascular disease in his legs. Denies fever, chills, nausea or vomiting. He came in by EMS from home. His last PCP appointment, was 3 months ago. 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 There are no other known modifying factors.   Past Medical History  Diagnosis Date  . Arthritis   . Ulcer     Peptic disease  . Hiatal hernia   . Heart murmur   . DVT (deep venous thrombosis) (HCC)   . Peripheral vascular disease (HCC)   . Reflux   . Anemia   . Testosterone insufficiency 2013  . Unspecified essential hypertension   . Impotence   . Other and unspecified hyperlipidemia   . 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 Hypothyroidism 12/14/2014  . 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 Chronic pain    Past Surgical History  Procedure Laterality Date  . Spine surgery    . Kidney stone surgery     Family History  Problem Relation Age of Onset  . Hypertension Mother   . Stroke Father 11   Social History  Substance Use Topics  . Smoking status: Former Smoker -- 0.25 packs/day for 3 years    Types: Cigarettes  . Smokeless tobacco: Former Neurosurgeon    Quit date: 07/17/2007  . Alcohol Use: No    Review of Systems  All other systems reviewed and are negative.     Allergies  Codeine;  Demerol; Disalcid; Feldene; Penicillins; and Sulfa antibiotics  Home Medications   Prior to Admission medications   Medication Sig Start Date End Date Taking? Authorizing Provider  Alum & Mag Hydroxide-Simeth (MAGIC MOUTHWASH) SOLN Swish and swallow one teaspoonful four times daily **shake well** Patient not taking: Reported on 03/23/2015 11/10/13   Kimber Relic, MD  ANDROGEL PUMP 20.25 MG/ACT (1.62%) GEL APPLY 2 PUMP ACTUATIONS DAILY FOR TESTOSTERONE REPLACEMENT. 06/20/15   Tiffany L Reed, DO  baclofen (LIORESAL) 10 MG tablet 1 tablet 3 times daily to help relax muscles 04/07/15   Kimber Relic, MD  diazepam (VALIUM) 10 MG tablet Take one tablet by mouth up to four times daily to relax muscles 06/02/15   Tiffany L Reed, DO  diclofenac sodium (VOLTAREN) 1 % GEL APPLY 2-4GM TO AFFECTED AREA UP TO 4 TIMES A DAY 12/14/14   Kimber Relic, MD  Diphenhyd-Hydrocort-Nystatin SUSP Swish ans Swallow 5cc four times a day as needed 10/21/12   Kimber Relic, MD  HYDROcodone-acetaminophen [facility]) 7.5-325 MG tablet Take one tablet every 6 hours if needed for cough 06/03/15   Tiffany L Reed, DO  levothyroxine (SYNTHROID, LEVOTHROID) 50 MCG tablet TAKE 1 TABLET EACH DAY FOR THYROID SUPPLEMENT 12/14/14   Kimber Relic, MD  lidocaine (XYLOCAINE) 5 % ointment Apply twice daily to affected  area as needed for pain 06/20/15   Tiffany L Reed, DO  losartan (COZAAR) 50 MG tablet One daily to control BP 12/14/14   Kimber Relic, MD  methadone (DOLOPHINE) 10 MG tablet Take four tablets every 6 hours to control pain 06/03/15   Tiffany L Reed, DO  minoxidil (LONITEN) 10 MG tablet TAKE 1 TABLET DAILY TO CONTROL BLOOD PRESSURE. 12/14/14   Kimber Relic, MD  NITRO-BID 2 % ointment APPLY 1 INCH STRIP TO FEET TWICE DAILY. 05/20/15   Kimber Relic, MD  omeprazole (PRILOSEC) 20 MG capsule TAKE (2) CAPSULES TWICE DAILY. 03/22/15   Kimber Relic, MD  Oxycodone HCl 10 MG TABS Take four tablets by mouth every 6 hours as needed for pain  06/03/15   Tiffany L Reed, DO  SF 5000 PLUS 1.1 % CREA dental cream BRUSH ON TEETH FOR 2 MINUTES THEN SWISH AND EXPECTORATE. 06/20/15   Kimber Relic, MD  temazepam (RESTORIL) 30 MG capsule TAKE 1 CAPSULE ONCE DAILY AT BEDTIME AS NEEDED FOR REST. 06/07/15   Kimber Relic, MD  traMADol (ULTRAM) 50 MG tablet TAKE 1 TABLET UP TO FOUR TIMES DAILY AS NEEDED FOR PAIN. 06/14/15   Kimber Relic, MD   BP 154/93 mmHg  Pulse 75  Temp(Src) 99.4 F (37.4 C) (Oral)  Resp 11  Ht  (1.753 m)  Wt 205 lb (92.987 kg)  BMI 30.26 kg/m2  SpO2 98% Physical Exam  Constitutional: He is oriented to person, place, and time. He appears well-developed and well-nourished. He appears distressed (he is anxious).  HENT:  Head: Normocephalic and atraumatic.  Right Ear: External ear normal.  Left Ear: External ear normal.  Eyes: Conjunctivae and EOM are normal. Pupils are equal, round, and reactive to light.  Neck: Normal range of motion and phonation normal. Neck supple.  Cardiovascular: Normal rate, regular rhythm and normal heart sounds.   Toes are pink and warm bilaterally with normal capillary refill. Palpable left dorsalis pedis pulse. With Doppler, he has triphasic pulses of the right posterior tibial and dorsalis pedis arteries.   Pulmonary/Chest: Effort normal and breath sounds normal. He exhibits no bony tenderness.  Abdominal: Soft. There is no tenderness.  Musculoskeletal: Normal range of motion.  Neurological: He is alert and oriented to person, place, and time. No cranial nerve deficit or sensory deficit. He exhibits normal muscle tone. Coordination normal.  Skin: Skin is warm, dry and intact.  Psychiatric: He has a normal mood and affect. His behavior is normal. Judgment and thought content normal.  Nursing note and vitals reviewed.   ED Course  Procedures (including critical care time) Medications - No data to display  Patient Vitals for the past 24 hrs:  BP Temp Temp src Pulse Resp SpO2  Height Weight  06/28/15 1513 168/90 mmHg - - 81 14 98 % - -  06/28/15 1430 179/98 mmHg - - 84 13 99 % - -  06/28/15 1330 154/93 mmHg - - 75 11 98 % - -  06/28/15 1315 182/86 mmHg - - 85 15 98 % - -  06/28/15 1233 (!) 207/106 mmHg 99.4 F (37.4 C) Oral 97 18 99 %  (1.753 m) 205 lb (92.987 kg)  06/28/15 1229 - - - - - 100 % - -    Findings discussed with patient, and all questions were answered.   Labs Review Labs Reviewed - No data to display  Imaging Review No results found. I have personally reviewed and  evaluated these images and lab results as part of my medical decision-making.   EKG Interpretation   Date/Time:  Tuesday June 28 2015 12:31:24 EST Ventricular Rate:  99 PR Interval:  191 QRS Duration: 93 QT Interval:  364 QTC Calculation: 467 R Axis:   78 Text Interpretation:  Sinus rhythm Biatrial enlargement since last tracing  no significant change Confirmed by Effie ShyWENTZ  MD, Montford Barg ([id number]) on  06/28/2015 1:49:21 PM      MDM   Final diagnoses:  Chronic pain    Patient with chronic pain, in no apparent change in his baseline clinical status. There is no indication for further evaluation or treatment in the emergency department setting.  Nursing Notes Reviewed/ Care Coordinated Applicable Imaging Reviewed Interpretation of Laboratory Data incorporated into ED treatment  The patient appears reasonably screened and/or stabilized for discharge and I doubt any other medical condition or other [facility] requiring further screening, evaluation, or treatment in the ED at this time prior to discharge.  Plan: Home Medications- usual; Home Treatments- rest; return here if the recommended treatment, does not improve the symptoms; Recommended follow up- PCP prn     Mancel BaleElliott Shafiq Larch, MD 06/28/15 1718

## 2015-06-28 NOTE — Discharge Instructions (Signed)
Follow-up with your primary care doctor as soon as possible for further care and treatment. For your foot discomfort, use heat on the area 2 or 3 times a day.   Chronic Pain Chronic pain can be defined as pain that is off and on and lasts for 3-6 months or longer. Many things cause chronic pain, which can make it difficult to make a diagnosis. There are many treatment options available for chronic pain. However, finding a treatment that works well for you may require trying various approaches until the right one is found. Many people benefit from a combination of two or more types of treatment to control their pain. SYMPTOMS  Chronic pain can occur anywhere in the body and can range from mild to very severe. Some types of chronic pain include:  Headache.  Low back pain.  Cancer pain.  Arthritis pain.  Neurogenic pain. This is pain resulting from damage to nerves. People with chronic pain may also have other symptoms such as:  Depression.  Anger.  Insomnia.  Anxiety. DIAGNOSIS  Your health care provider will help diagnose your condition over time. In many cases, the initial focus will be on excluding possible conditions that could be causing the pain. Depending on your symptoms, your health care provider may order tests to diagnose your condition. Some of these tests may include:   Blood tests.   CT scan.   MRI.   X-rays.   Ultrasounds.   Nerve conduction studies.  You may need to see a specialist.  TREATMENT  Finding treatment that works well may take time. You may be referred to a pain specialist. He or she may prescribe medicine or therapies, such as:   Mindful meditation or yoga.  Shots (injections) of numbing or pain-relieving medicines into the spine or area of pain.  Local electrical stimulation.  Acupuncture.   Massage therapy.   Aroma, color, light, or sound therapy.   Biofeedback.   Working with a physical therapist to keep from getting  stiff.   Regular, gentle exercise.   Cognitive or behavioral therapy.   Group support.  Sometimes, surgery may be recommended.  HOME CARE INSTRUCTIONS   Take all medicines as directed by your health care provider.   Lessen stress in your life by relaxing and doing things such as listening to calming music.   Exercise or be active as directed by your health care provider.   Eat a healthy diet and include things such as vegetables, fruits, fish, and lean meats in your diet.   Keep all follow-up appointments with your health care provider.   Attend a support group with others suffering from chronic pain. SEEK MEDICAL CARE IF:   Your pain gets worse.   You develop a new pain that was not there before.   You cannot tolerate medicines given to you by your health care provider.   You have new symptoms since your last visit with your health care provider.  SEEK IMMEDIATE MEDICAL CARE IF:   You feel weak.   You have decreased sensation or numbness.   You lose control of bowel or bladder function.   Your pain suddenly gets much worse.   You develop shaking.  You develop chills.  You develop confusion.  You develop chest pain.  You develop shortness of breath.  MAKE SURE YOU:  Understand these instructions.  Will watch your condition.  Will get help right away if you are not doing well or get worse.   This information  is not intended to replace advice given to you by your health care provider. Make sure you discuss any questions you have with your health care provider.   Document Released: 03/24/2002 Document Revised: 03/04/2013 Document Reviewed: 12/26/2012 Elsevier Interactive Patient Education [employer]2016 [employer] Inc.

## 2015-06-28 NOTE — ED Notes (Signed)
Pt here from home via EMS with c/o  Increased generalized pain x 3 months with CP for years. Pt also reports recent fever. Pt sts pain is worse in legs and feet back and head.

## 2015-06-30 ENCOUNTER — Other Ambulatory Visit: Payer: Self-pay | Admitting: Internal Medicine

## 2015-06-30 NOTE — Telephone Encounter (Signed)
[facility] 

## 2015-07-01 ENCOUNTER — Other Ambulatory Visit: Payer: Self-pay | Admitting: *Deleted

## 2015-07-01 DIAGNOSIS — G8929 Other chronic pain: Secondary | ICD-10-CM

## 2015-07-01 DIAGNOSIS — R05 Cough: Secondary | ICD-10-CM

## 2015-07-01 DIAGNOSIS — R059 Cough, unspecified: Secondary | ICD-10-CM

## 2015-07-01 MED ORDER — HYDROCODONE-ACETAMINOPHEN 7.5-325 MG PO TABS: ORAL_TABLET | ORAL | Status: DC

## 2015-07-01 MED ORDER — OXYCODONE HCL 10 MG PO TABS: ORAL_TABLET | ORAL | Status: DC

## 2015-07-01 MED ORDER — METHADONE HCL 10 MG PO TABS: ORAL_TABLET | ORAL | Status: DC

## 2015-07-01 NOTE — Telephone Encounter (Signed)
Patient requested and will pick up 

## 2015-07-05 ENCOUNTER — Other Ambulatory Visit: Payer: Self-pay | Admitting: Internal Medicine

## 2015-07-08 ENCOUNTER — Other Ambulatory Visit: Payer: Self-pay | Admitting: Internal Medicine

## 2015-07-11 ENCOUNTER — Other Ambulatory Visit: Payer: Self-pay | Admitting: Internal Medicine

## 2015-07-12 NOTE — Telephone Encounter (Signed)
Left message on voicemail for patient to return call when available   

## 2015-07-13 ENCOUNTER — Other Ambulatory Visit: Payer: Self-pay | Admitting: Internal Medicine

## 2015-07-13 DIAGNOSIS — G039 Meningitis, unspecified: Secondary | ICD-10-CM

## 2015-07-13 MED ORDER — TRAMADOL HCL 50 MG PO TABS: ORAL_TABLET | ORAL | Status: DC

## 2015-07-13 NOTE — Telephone Encounter (Signed)
Called patient to ask him if he is still taking tramadol he says yes but it is no longer on his med list . Please Advise

## 2015-07-13 NOTE — Telephone Encounter (Signed)
Tramadol appears to been discontinued at the emergency room for no discernible reason. We have prescribed it for years for him as it did seem to help with his pain control. He is on other pelvic medications including methadone and hydrocodone.  If the tramadol is continuing to help him, I do not mind continuing his prescription for this medication. Previous directions call for 50 mg up to 4 times daily as needed to control pain. He would receive 120 tablets for a 30 day prescription.

## 2015-07-13 NOTE — Telephone Encounter (Signed)
Left message on voicemail for patient to return call when available   

## 2015-07-14 ENCOUNTER — Other Ambulatory Visit: Payer: Self-pay

## 2015-07-14 ENCOUNTER — Telehealth: Payer: Self-pay

## 2015-07-14 MED ORDER — TRAMADOL HCL 50 MG PO TABS: ORAL_TABLET | ORAL | Status: DC

## 2015-07-14 NOTE — Telephone Encounter (Signed)
[facility]

## 2015-07-14 NOTE — Telephone Encounter (Signed)
Called left message for patient to call office to see if he would like to pick up his prescription or have it sent electronically

## 2015-07-27 ENCOUNTER — Other Ambulatory Visit: Payer: Self-pay | Admitting: Internal Medicine

## 2015-07-27 ENCOUNTER — Other Ambulatory Visit: Payer: Self-pay | Admitting: Nurse Practitioner

## 2015-07-29 ENCOUNTER — Other Ambulatory Visit: Payer: Self-pay | Admitting: *Deleted

## 2015-07-29 ENCOUNTER — Other Ambulatory Visit: Payer: Self-pay | Admitting: Nurse Practitioner

## 2015-07-29 DIAGNOSIS — R05 Cough: Secondary | ICD-10-CM

## 2015-07-29 DIAGNOSIS — G8929 Other chronic pain: Secondary | ICD-10-CM

## 2015-07-29 DIAGNOSIS — R059 Cough, unspecified: Secondary | ICD-10-CM

## 2015-07-29 MED ORDER — OXYCODONE HCL 10 MG PO TABS: ORAL_TABLET | ORAL | Status: DC

## 2015-07-29 MED ORDER — DIAZEPAM 10 MG PO TABS: ORAL_TABLET | ORAL | Status: DC

## 2015-07-29 MED ORDER — METHADONE HCL 10 MG PO TABS
ORAL_TABLET | ORAL | Status: DC
Start: 2015-07-29 — End: 2015-08-26

## 2015-07-29 MED ORDER — HYDROCODONE-ACETAMINOPHEN 7.5-325 MG PO TABS: ORAL_TABLET | ORAL | Status: DC

## 2015-07-29 NOTE — Telephone Encounter (Signed)
Patient requested and will pick up 

## 2015-08-04 ENCOUNTER — Other Ambulatory Visit: Payer: Self-pay | Admitting: *Deleted

## 2015-08-04 MED ORDER — SODIUM FLUORIDE 1.1 % DT CREA: TOPICAL_CREAM | DENTAL | Status: DC

## 2015-08-04 NOTE — Telephone Encounter (Signed)
[facility]  

## 2015-08-05 ENCOUNTER — Other Ambulatory Visit: Payer: Self-pay | Admitting: Internal Medicine

## 2015-08-09 ENCOUNTER — Ambulatory Visit: Payer: Self-pay | Admitting: Internal Medicine

## 2015-08-11 ENCOUNTER — Other Ambulatory Visit: Payer: Self-pay | Admitting: Internal Medicine

## 2015-08-24 ENCOUNTER — Other Ambulatory Visit: Payer: Self-pay | Admitting: Internal Medicine

## 2015-08-25 ENCOUNTER — Other Ambulatory Visit: Payer: Self-pay | Admitting: Internal Medicine

## 2015-08-26 ENCOUNTER — Other Ambulatory Visit: Payer: Self-pay | Admitting: *Deleted

## 2015-08-26 DIAGNOSIS — R059 Cough, unspecified: Secondary | ICD-10-CM

## 2015-08-26 DIAGNOSIS — R05 Cough: Secondary | ICD-10-CM

## 2015-08-26 DIAGNOSIS — G8929 Other chronic pain: Secondary | ICD-10-CM

## 2015-08-26 MED ORDER — METHADONE HCL 10 MG PO TABS: ORAL_TABLET | ORAL | Status: DC

## 2015-08-26 MED ORDER — OXYCODONE HCL 10 MG PO TABS: ORAL_TABLET | ORAL | Status: DC

## 2015-08-26 MED ORDER — HYDROCODONE-ACETAMINOPHEN 7.5-325 MG PO TABS: ORAL_TABLET | ORAL | Status: DC

## 2015-08-26 NOTE — Telephone Encounter (Signed)
Patient requested and neighbor will pick up. To sign contract at appointment on Tuesday

## 2015-08-30 ENCOUNTER — Ambulatory Visit: Payer: Medicare Other | Admitting: Internal Medicine

## 2015-09-01 ENCOUNTER — Other Ambulatory Visit: Payer: Self-pay | Admitting: Internal Medicine

## 2015-09-06 ENCOUNTER — Ambulatory Visit: Payer: Medicare Other | Admitting: Internal Medicine

## 2015-09-09 ENCOUNTER — Other Ambulatory Visit: Payer: Self-pay | Admitting: Internal Medicine

## 2015-09-09 ENCOUNTER — Other Ambulatory Visit: Payer: Self-pay

## 2015-09-09 DIAGNOSIS — G039 Meningitis, unspecified: Secondary | ICD-10-CM

## 2015-09-09 MED ORDER — TRAMADOL HCL 50 MG PO TABS: ORAL_TABLET | ORAL | Status: DC

## 2015-09-14 ENCOUNTER — Telehealth: Payer: Self-pay | Admitting: *Deleted

## 2015-09-14 NOTE — Telephone Encounter (Signed)
[name] with [facility] called asking a favor. Wants to know if Dr. Chilton Si will handwrite a Rx to avoid audit and losing money and must state:  for the Lidocaine Ointment Apply twice daily for pain 5 tubes must last 30 days. Dated 08/25/2015 Handwritten and left for Dr. Chilton Si to review and sign.

## 2015-09-21 ENCOUNTER — Other Ambulatory Visit: Payer: Self-pay | Admitting: Internal Medicine

## 2015-09-21 ENCOUNTER — Other Ambulatory Visit: Payer: Self-pay

## 2015-09-21 DIAGNOSIS — R059 Cough, unspecified: Secondary | ICD-10-CM

## 2015-09-21 DIAGNOSIS — R05 Cough: Secondary | ICD-10-CM

## 2015-09-21 DIAGNOSIS — G8929 Other chronic pain: Secondary | ICD-10-CM

## 2015-09-21 MED ORDER — METHADONE HCL 10 MG PO TABS: ORAL_TABLET | ORAL | Status: DC

## 2015-09-21 MED ORDER — OXYCODONE HCL 10 MG PO TABS: ORAL_TABLET | ORAL | Status: DC

## 2015-09-21 MED ORDER — HYDROCODONE-ACETAMINOPHEN 7.5-325 MG PO TABS: ORAL_TABLET | ORAL | Status: DC

## 2015-09-21 NOTE — Telephone Encounter (Signed)
Last appointment 03/23/15, patient was told to schedule a 4 month follow-up. Patient cancelled appointments on 08/09/15, 08/30/15 and 09/06/15. Please advise if ok to refill medication

## 2015-09-21 NOTE — Telephone Encounter (Signed)
OK to refill medication. Make appt in the next 2 months.

## 2015-09-27 ENCOUNTER — Ambulatory Visit: Payer: Medicare Other | Admitting: Internal Medicine

## 2015-09-28 ENCOUNTER — Other Ambulatory Visit: Payer: Self-pay

## 2015-09-28 MED ORDER — TEMAZEPAM 30 MG PO CAPS: ORAL_CAPSULE | ORAL | Status: DC

## 2015-09-28 NOTE — Telephone Encounter (Signed)
Faxed to [facility] 

## 2015-10-04 ENCOUNTER — Encounter: Payer: Self-pay | Admitting: Internal Medicine

## 2015-10-04 ENCOUNTER — Other Ambulatory Visit: Payer: Self-pay | Admitting: Internal Medicine

## 2015-10-04 NOTE — Telephone Encounter (Signed)
Patient has follow-up appointment 11/08/2015.  I have drafted a Letter to be mailed to him encouraging him to come to that appointment, otherwise we will not be able to refill medications until he comes in.  Refill this prescription request for tramadol.

## 2015-10-04 NOTE — Telephone Encounter (Signed)
Dr.Green please advise. Medication last filled on 09/09/15. Last OV 03/23/15. Pending OV 11/08/15. Patient has cancelled 4 appointments within the last 6 months. No controlled substance contract on file.

## 2015-10-19 ENCOUNTER — Other Ambulatory Visit: Payer: Self-pay | Admitting: Internal Medicine

## 2015-10-21 ENCOUNTER — Other Ambulatory Visit: Payer: Self-pay | Admitting: Internal Medicine

## 2015-10-21 ENCOUNTER — Telehealth: Payer: Self-pay | Admitting: *Deleted

## 2015-10-21 DIAGNOSIS — R059 Cough, unspecified: Secondary | ICD-10-CM

## 2015-10-21 DIAGNOSIS — I10 Essential (primary) hypertension: Secondary | ICD-10-CM

## 2015-10-21 DIAGNOSIS — R05 Cough: Secondary | ICD-10-CM

## 2015-10-21 DIAGNOSIS — G8929 Other chronic pain: Secondary | ICD-10-CM

## 2015-10-21 MED ORDER — OXYCODONE HCL 10 MG PO TABS: ORAL_TABLET | ORAL | Status: DC

## 2015-10-21 MED ORDER — METHADONE HCL 10 MG PO TABS: ORAL_TABLET | ORAL | Status: DC

## 2015-10-21 MED ORDER — HYDROCODONE-ACETAMINOPHEN 7.5-325 MG PO TABS: ORAL_TABLET | ORAL | Status: DC

## 2015-10-21 MED ORDER — DIAZEPAM 10 MG PO TABS: ORAL_TABLET | ORAL | Status: DC

## 2015-10-21 NOTE — Addendum Note (Signed)
Addended by: Sueanne MargaritaSMITH, Zakyra Kukuk L on: 10/21/2015 09:10 AM   Modules accepted: Orders

## 2015-10-21 NOTE — Telephone Encounter (Signed)
Patient's neighbor, Lorna Fewhelma Sexton [phone] came into office to pick up patient's pain medications and stated that Onalee HuaDavid wanted to let you know that he is in extreme pain in his back and legs (mostly his entire body) and can hardly walk. She picks up his Rx's and gets cereal, milk and alittle fruit at the Grocery store for him. She stated that he is quite ill and just wanted to let you know.

## 2015-10-21 NOTE — Telephone Encounter (Signed)
Noted. [name] has broken several appointments. He really should come in as scheduled.

## 2015-10-24 NOTE — Telephone Encounter (Signed)
Appointment November 08, 2015 with Dr. Chilton SiGreen.

## 2015-10-27 ENCOUNTER — Other Ambulatory Visit: Payer: Self-pay | Admitting: Internal Medicine

## 2015-11-03 ENCOUNTER — Other Ambulatory Visit: Payer: Self-pay | Admitting: Internal Medicine

## 2015-11-03 NOTE — Telephone Encounter (Signed)
It is okay to refill his tramadol as requested.

## 2015-11-03 NOTE — Telephone Encounter (Signed)
Please advise 

## 2015-11-08 ENCOUNTER — Ambulatory Visit: Payer: Medicare Other | Admitting: Internal Medicine

## 2015-11-09 ENCOUNTER — Telehealth: Payer: Self-pay | Admitting: Internal Medicine

## 2015-11-09 NOTE — Telephone Encounter (Signed)
I called [name] on today, to discuss possible interventions. [name] indicated in a letter he is not physically able to come to appointment to see Dr. Chilton SiGreen.   I left message asking him to call me back..Apolinar Junescdavis

## 2015-11-10 ENCOUNTER — Other Ambulatory Visit: Payer: Self-pay | Admitting: Internal Medicine

## 2015-11-16 ENCOUNTER — Other Ambulatory Visit: Payer: Self-pay | Admitting: Internal Medicine

## 2015-11-16 ENCOUNTER — Telehealth: Payer: Self-pay | Admitting: Internal Medicine

## 2015-11-16 NOTE — Telephone Encounter (Signed)
Called [name] again today, No answer.Austin Barnes.   Gave letter back to Dr. Chilton SiGreen, To send a response to [name] via letter.   cdavis

## 2015-11-17 ENCOUNTER — Other Ambulatory Visit: Payer: Self-pay | Admitting: *Deleted

## 2015-11-17 ENCOUNTER — Telehealth: Payer: Self-pay | Admitting: Internal Medicine

## 2015-11-17 DIAGNOSIS — R05 Cough: Secondary | ICD-10-CM

## 2015-11-17 DIAGNOSIS — G8929 Other chronic pain: Secondary | ICD-10-CM

## 2015-11-17 DIAGNOSIS — R059 Cough, unspecified: Secondary | ICD-10-CM

## 2015-11-17 MED ORDER — OXYCODONE HCL 10 MG PO TABS: ORAL_TABLET | ORAL | Status: DC

## 2015-11-17 MED ORDER — HYDROCODONE-ACETAMINOPHEN 7.5-325 MG PO TABS: ORAL_TABLET | ORAL | Status: DC

## 2015-11-17 MED ORDER — METHADONE HCL 10 MG PO TABS: ORAL_TABLET | ORAL | Status: DC

## 2015-11-17 MED ORDER — DIAZEPAM 10 MG PO TABS: ORAL_TABLET | ORAL | Status: DC

## 2015-11-17 NOTE — Telephone Encounter (Signed)
Printed Rx's per Aram Beechamynthia and Dr.Green. Patient has an appointment this week and stated that he will keep it.  Printed alittle early so Dr. Chilton SiGreen can sign.

## 2015-11-17 NOTE — Telephone Encounter (Signed)
I have been trying to reach [name] for a few days now, he called the office today for his medication refill, and I spoke with him. [name] continue to cancel his appointments after he has received his pain medication refilled.  We discussed possible interventions per Dr. Chilton SiGreen, see below.....    *  Home Care nursing with physical therapy and possible occupational therapy for evaluation of his medical conditions and adaptive needs due to his weakened state and difficulty with walking. *  Referral to pain management clinic * Reevaluation by Orthopedist or Neurosurgeon * Gastroenterology referral for gastrointestrinal complaints * Counseling from Psychiatrist or Psychologist *Consultation with rehabilitation specialist. [name] refused all of the intervention, no transportation, no money and in too much pain. Stated his condition has gotten worth overtime and he is not able to get around without being in pain. I suggested Austin Barnes come to the office to discuss his condition as Dr. Amanda CockayneGreens recommends . Says he will come to the appointment if he is still alive.  I asked Austin Barnes what did he mean by that  statement " if he is alive".  Austin Barnes's response was I will be there even if I have to call 911. I explained to Austin Barnes that it a state law  Dr. Chilton SiGreen has to see you every 1 to 3 months to be evaluated because the narcotic medication he is presently taking is a control substances.  Informed Austin Barnes if he does not come to his appointment on Nov 23, 2015, he will not receive his next month medication if he has not seen Dr. Chilton SiGreen. Cdavis

## 2015-11-18 ENCOUNTER — Other Ambulatory Visit: Payer: Self-pay | Admitting: Internal Medicine

## 2015-11-23 ENCOUNTER — Other Ambulatory Visit: Payer: Self-pay | Admitting: Internal Medicine

## 2015-11-23 ENCOUNTER — Other Ambulatory Visit: Payer: Self-pay | Admitting: Nurse Practitioner

## 2015-11-23 ENCOUNTER — Ambulatory Visit: Payer: Medicare Other | Admitting: Internal Medicine

## 2015-11-23 NOTE — Telephone Encounter (Signed)
Patient was scheduled for an appointment for today, patient cancelled and rescheduled for 11-30-15. Patient has no showed and rescheduled several appointments. Patient does not have a contract on file  Please advise

## 2015-11-30 ENCOUNTER — Ambulatory Visit: Payer: Medicare Other | Admitting: Internal Medicine

## 2015-12-06 ENCOUNTER — Telehealth: Payer: Self-pay | Admitting: Internal Medicine

## 2015-12-06 ENCOUNTER — Ambulatory Visit: Payer: Medicare Other | Admitting: Internal Medicine

## 2015-12-06 NOTE — Telephone Encounter (Signed)
Patient was a NO CALL NO SHOW for appt 12/06/15 w/ Dr. Chilton SiGreen - pt called the office 12/05/15 to cancel this appt - Gave call to Aram BeechamCynthia who spoke to the patient and explained to him the importance of keeping his appt with Dr. Chilton SiGreen today

## 2015-12-07 ENCOUNTER — Encounter: Payer: Self-pay | Admitting: Internal Medicine

## 2015-12-08 NOTE — Telephone Encounter (Signed)
I spoke with Risk Management on Dec 07, 2015.  They inform us to send [name] a warning letter regarding his next upcoming appointment, and stress the importance of him keeping this appointment.  [name] has been told Dr. Chilton SiGreen can not continue to give [name] pain medication w/o an office visit.  [name] has scheduled appointments and when it is time for the appt, he cancels.  I spoke with [name] today at 11:37am.  Gave him appointment information, stated he has made other arrangement.  I asked him to explain that comment.Marland Kitchen.Marland Kitchen.Marland Kitchen.Says he will not be coming to this appointment on Dec 14, 2015  or any other appointment in the future.    Asked if I would thank Dr. Chilton SiGreen for his 4 years of medical care......Marland Kitchen. [name] ended the call......cdavis

## 2015-12-14 ENCOUNTER — Other Ambulatory Visit: Payer: Self-pay | Admitting: Internal Medicine

## 2015-12-14 ENCOUNTER — Ambulatory Visit (INDEPENDENT_AMBULATORY_CARE_PROVIDER_SITE_OTHER): Payer: Medicare Other | Admitting: Internal Medicine

## 2015-12-14 ENCOUNTER — Encounter: Payer: Self-pay | Admitting: Internal Medicine

## 2015-12-14 VITALS — BP 180/100 | HR 79 | Temp 98.9°F | Ht 69.0 in | Wt 180.0 lb

## 2015-12-14 DIAGNOSIS — M1990 Unspecified osteoarthritis, unspecified site: Secondary | ICD-10-CM | POA: Diagnosis not present

## 2015-12-14 DIAGNOSIS — E1151 Type 2 diabetes mellitus with diabetic peripheral angiopathy without gangrene: Secondary | ICD-10-CM | POA: Diagnosis not present

## 2015-12-14 DIAGNOSIS — M609 Myositis, unspecified: Secondary | ICD-10-CM

## 2015-12-14 DIAGNOSIS — E039 Hypothyroidism, unspecified: Secondary | ICD-10-CM

## 2015-12-14 DIAGNOSIS — M791 Myalgia: Secondary | ICD-10-CM | POA: Diagnosis not present

## 2015-12-14 DIAGNOSIS — G8929 Other chronic pain: Secondary | ICD-10-CM

## 2015-12-14 DIAGNOSIS — I10 Essential (primary) hypertension: Secondary | ICD-10-CM | POA: Diagnosis not present

## 2015-12-14 DIAGNOSIS — R531 Weakness: Secondary | ICD-10-CM | POA: Diagnosis not present

## 2015-12-14 DIAGNOSIS — G039 Meningitis, unspecified: Secondary | ICD-10-CM

## 2015-12-14 DIAGNOSIS — IMO0001 Reserved for inherently not codable concepts without codable children: Secondary | ICD-10-CM | POA: Insufficient documentation

## 2015-12-14 DIAGNOSIS — K861 Other chronic pancreatitis: Secondary | ICD-10-CM

## 2015-12-14 DIAGNOSIS — E785 Hyperlipidemia, unspecified: Secondary | ICD-10-CM | POA: Diagnosis not present

## 2015-12-14 DIAGNOSIS — D649 Anemia, unspecified: Secondary | ICD-10-CM | POA: Diagnosis not present

## 2015-12-14 DIAGNOSIS — R634 Abnormal weight loss: Secondary | ICD-10-CM | POA: Diagnosis not present

## 2015-12-14 DIAGNOSIS — R350 Frequency of micturition: Secondary | ICD-10-CM | POA: Insufficient documentation

## 2015-12-14 MED ORDER — BACLOFEN 10 MG PO TABS: ORAL_TABLET | ORAL | Status: DC

## 2015-12-14 MED ORDER — DIAZEPAM 10 MG PO TABS: ORAL_TABLET | ORAL | Status: DC

## 2015-12-14 MED ORDER — OXYCODONE HCL 10 MG PO TABS: ORAL_TABLET | ORAL | Status: DC

## 2015-12-14 MED ORDER — METHADONE HCL 10 MG PO TABS: ORAL_TABLET | ORAL | Status: DC

## 2015-12-14 NOTE — Progress Notes (Signed)
Patient ID: Austin Barnes, male   DOB: [birth year], 65 y.o.   MRN: [id number]    Facility  [facility]    Place of Service:   OFFICE    Allergies  Allergen Reactions  . Codeine   . Demerol   . Disalcid [Salsalate]   . Feldene [Piroxicam]   . Penicillins     Has patient had a PCN reaction causing immediate rash, facial/tongue/throat swelling, SOB or lightheadedness with hypotension: NO Has patient had a PCN reaction causing severe rash involving mucus membranes or skin necrosis: NO Has patient had a PCN reaction that required hospitalization NO Has patient had a PCN reaction occurring within the last 10 years: No If all of the above answers are "NO", then may proceed with Cephalosporin use.  Austin Barnes Antibiotics     Chief Complaint  Patient presents with  . Medical Management of Chronic Issues    8 month follow-up, patient due for A1c/MALB   . Medication Management    Discuss restarting SOMA, Temazepam not working   . URI    Patient c/o spitting discolored congestion, fever (off/on), and headaches     HPI:  Feeling very sick. Bones and joints are painful. Feels like bones have shrunk. Muscle mass has been lost. Has Ace wrapped the right elbow for the last 3 weeks.  Complains of muscle spasms.  BP not controlled.  Pharnacist suggested change to Dilaudid.8 mg q8h from current oxycodone.  Can't breathe. Spitting up Austin Barnes material.  Watery postprandial stools. Has been like that for years.    Medications: Patient's Medications  New Prescriptions   No medications on file  Previous Medications   ANDROGEL PUMP 20.25 MG/ACT (1.62%) GEL    APPLY 2 PUMP ACTUATIONS DAILY FOR TESTOSTERONE REPLACEMENT.   BACLOFEN (LIORESAL) 10 MG TABLET    TAKE 1 TABLET THREE TIMES DAILY AS NEEDED FOR MUSCLE RELAXATION.   DIAZEPAM (VALIUM) 10 MG TABLET    TAKE 1 TABLET UP TO FOUR TIMES DAILY TO RELAX MUSCLES.   DICLOFENAC SODIUM (VOLTAREN) 1 % GEL    APPLY 2-4GM TO AFFECTED AREA UP TO 4 TIMES A DAY     HYDROCODONE-ACETAMINOPHEN (NORCO) 7.5-325 MG TABLET    Take one tablet every 6 hours if needed for cough   LEVOTHYROXINE (SYNTHROID, LEVOTHROID) 50 MCG TABLET    TAKE 1 TABLET EACH DAY FOR THYROID SUPPLEMENT   LOSARTAN (COZAAR) 50 MG TABLET    One daily to control BP   METHADONE (DOLOPHINE) 10 MG TABLET    Take four tablets every 6 hours to control pain   MINOXIDIL (LONITEN) 10 MG TABLET    TAKE 1 TABLET DAILY TO CONTROL BLOOD PRESSURE.   NITRO-BID 2 % OINTMENT    APPLY 1 INCH STRIP TO FEET TWICE DAILY.   OMEPRAZOLE (PRILOSEC) 20 MG CAPSULE    TAKE (2) CAPSULES TWICE DAILY.   OXYCODONE HCL 10 MG TABS    Take four tablets by mouth every 6 hours as needed for pain   SF 5000 PLUS 1.1 % CREA DENTAL CREAM    BRUSH ON TEETH FOR 2 MINUTES THEN SWISH AND EXPECTORATE.   TEMAZEPAM (RESTORIL) 30 MG CAPSULE    TAKE 1 CAPSULE ONCE DAILY AT BEDTIME AS NEEDED FOR REST.   TRAMADOL (ULTRAM) 50 MG TABLET    TAKE 1 TABLET UP TO FOUR TIMES DAILY AS NEEDED FOR PAIN.  Modified Medications   No medications on file  Discontinued Medications   LIDOCAINE (XYLOCAINE) 5 % OINTMENT  APPLY TWICE A DAY AS NEEDED FOR PAIN.    Review of Systems  Constitutional: Positive for activity change and unexpected weight change (25# loss since Cec 2016 weight of 205#). Negative for appetite change.       Chronically ill  HENT: Negative for congestion and rhinorrhea.   Eyes: Positive for visual disturbance.  Respiratory: Positive for cough and shortness of breath.        Producing sputum  Cardiovascular: Positive for leg swelling. Negative for chest pain and palpitations.       History of peripheral artery disease  Gastrointestinal: Positive for diarrhea.       History of chronic and recurrent pancreatitis. Loose stools from time to time. Upper GI distress and epigastric pain.  Endocrine: Positive for polydipsia.       History of diabetes mellitus.  Musculoskeletal: Positive for myalgias, back pain, joint swelling (Right  knee), arthralgias, gait problem, neck pain and neck stiffness.       Pain in both hips.  Skin: Negative.   Allergic/Immunologic: Negative.   Neurological: Positive for dizziness, weakness and numbness.  Hematological:       History of anemia.  Psychiatric/Behavioral: Positive for dysphoric mood, decreased concentration and agitation. Negative for suicidal ideas, hallucinations, behavioral problems and confusion. The patient is nervous/anxious.     Filed Vitals:   12/14/15 1439  BP: 180/100  Pulse: 79  Temp: 98.9 F (37.2 C)  TempSrc: Oral  Height: 5\' 9"  (1.753 m)  Weight: 180 lb (81.647 kg)  SpO2: 98%   Body mass index is 26.57 kg/(m^2). Filed Weights   12/14/15 1439  Weight: 180 lb (81.647 kg)     Physical Exam  Constitutional: He is oriented to person, place, and time.  Mesomorphic. Rambling in thought content. Chronically ill appearance.  HENT:  Head: Normocephalic and atraumatic.  Right Ear: External ear normal.  Left Ear: External ear normal.  Nose: Nose normal.  Mouth/Throat: Oropharynx is clear and moist.  Eyes: Conjunctivae and EOM are normal. Pupils are equal, round, and reactive to light.  Neck: No JVD present. No tracheal deviation present. No thyromegaly present.  Cardiovascular: Normal rate, regular rhythm and normal heart sounds.  Exam reveals no gallop and no friction rub.   No murmur heard. Pulmonary/Chest: No respiratory distress. He has no wheezes. He has no rales.  Abdominal: He exhibits no distension and no mass. There is no tenderness.  Musculoskeletal: He exhibits edema and tenderness.  Muscular weakness and pains. Unstable gait. Uses walker. Velcro brace on the right knee. Ganglion cyst left wrist dorsum. Exquisitely tender to palpation at the left elbow malleolus.  Lymphadenopathy:    He has no cervical adenopathy.  Neurological: He is alert and oriented to person, place, and time. He displays abnormal reflex. No cranial nerve deficit.  Coordination abnormal.  Skin: No rash noted. No erythema. There is pallor.  Psychiatric:  Rambling speech. Frustrated.  Vitals reviewed.   Labs reviewed: No flowsheet data found. Lab Results  Component Value Date   TSH 3.890 12/14/2014   TSH 2.080 01/27/2014   TSH 2.400 04/08/2013   Lab Results  Component Value Date   BUN 17 12/14/2014   BUN 20 01/27/2014   BUN 18 04/08/2013   Lab Results  Component Value Date   HGBA1C 5.8* 12/14/2014   HGBA1C 6.0* 01/27/2014   HGBA1C 5.9* 04/08/2013    Assessment/Plan 1. Loss of weight Etiology undetermined. Suspect his chronic pancreatitis and diarrhea probably related to this. He does have  a poor appetite and probably is deficient in caloric intake as well.  2. Chronic pain - Oxycodone HCl 10 MG TABS; Take four tablets by mouth every 6 hours as needed for pain  Dispense: 480 tablet; Refill: 0 - methadone (DOLOPHINE) 10 MG tablet; Take four tablets every 6 hours to control pain  Dispense: 480 tablet; Refill: 0  3. Adhesive arachnoiditis unchanged  4. 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 Essential hypertension Elevated today. Erratic about using his antihypertensive medications - CMP  6. Hyperlipidemia - Lipid panel  7. Hypothyroidism, unspecified hypothyroidism type - TSH  8. Anemia, unspecified anemia type - CBC with Differential/Platelet  9. Chronic pancreatitis, unspecified pancreatitis type (HCC) - Amylase; Future - Lipase; Future  10. Weak - Ambulatory referral to Home Health  11. Myalgia and myositis - CK - Sedimentation rate - ANA  12. Arthritis Generalized and unchanged  13. Frequency of urination - Urinalysis - Urine culture

## 2015-12-14 NOTE — Patient Instructions (Signed)
Bring copy of Advance Directives- Health Care Power of Attorney and/or Living Will to next appointment.   

## 2015-12-15 ENCOUNTER — Other Ambulatory Visit: Payer: Self-pay | Admitting: Internal Medicine

## 2015-12-15 LAB — URINALYSIS
BILIRUBIN UA: NEGATIVE
Glucose, UA: NEGATIVE
KETONES UA: NEGATIVE
NITRITE UA: NEGATIVE
PROTEIN UA: NEGATIVE
RBC UA: NEGATIVE
SPEC GRAV UA: 1.015 (ref 1.005–1.030)
Urobilinogen, Ur: 0.2 mg/dL (ref 0.2–1.0)
pH, UA: 6 (ref 5.0–7.5)

## 2015-12-15 LAB — URINE CULTURE: Organism ID, Bacteria: NO GROWTH

## 2015-12-15 LAB — CBC WITH DIFFERENTIAL/PLATELET
BASOS: 1 %
Basophils Absolute: 0 10*3/uL (ref 0.0–0.2)
EOS (ABSOLUTE): 0 10*3/uL (ref 0.0–0.4)
EOS: 0 %
HEMATOCRIT: 38.4 % (ref 37.5–51.0)
Hemoglobin: 12.8 g/dL (ref 12.6–17.7)
Immature Grans (Abs): 0 10*3/uL (ref 0.0–0.1)
Immature Granulocytes: 0 %
Lymphocytes Absolute: 0.9 10*3/uL (ref 0.7–3.1)
Lymphs: 14 %
MCH: 30.7 pg (ref 26.6–33.0)
MCHC: 33.3 g/dL (ref 31.5–35.7)
MCV: 92 fL (ref 79–97)
MONOS ABS: 0.3 10*3/uL (ref 0.1–0.9)
Monocytes: 5 %
NEUTROS ABS: 5.1 10*3/uL (ref 1.4–7.0)
NEUTROS PCT: 80 %
PLATELETS: 303 10*3/uL (ref 150–379)
RBC: 4.17 x10E6/uL (ref 4.14–5.80)
RDW: 12.6 % (ref 12.3–15.4)
WBC: 6.3 10*3/uL (ref 3.4–10.8)

## 2015-12-15 LAB — COMPREHENSIVE METABOLIC PANEL
A/G RATIO: 2 (ref 1.2–2.2)
ALT: 15 IU/L (ref 0–44)
AST: 21 IU/L (ref 0–40)
Albumin: 4.7 g/dL (ref 3.6–4.8)
Alkaline Phosphatase: 93 IU/L (ref 39–117)
BILIRUBIN TOTAL: 0.2 mg/dL (ref 0.0–1.2)
BUN/Creatinine Ratio: 14 (ref 10–24)
BUN: 16 mg/dL (ref 8–27)
CALCIUM: 9.9 mg/dL (ref 8.6–10.2)
CHLORIDE: 98 mmol/L (ref 96–106)
CO2: 23 mmol/L (ref 18–29)
Creatinine, Ser: 1.16 mg/dL (ref 0.76–1.27)
GFR calc Af Amer: 77 mL/min/{1.73_m2} (ref >59)
GFR, EST NON AFRICAN AMERICAN: 67 mL/min/{1.73_m2} (ref >59)
GLOBULIN, TOTAL: 2.3 g/dL (ref 1.5–4.5)
Glucose: 103 mg/dL — ABNORMAL HIGH (ref 65–99)
POTASSIUM: 4.4 mmol/L (ref 3.5–5.2)
SODIUM: 138 mmol/L (ref 134–144)
Total Protein: 7 g/dL (ref 6.0–8.5)

## 2015-12-15 LAB — HEMOGLOBIN A1C
ESTIMATED AVERAGE GLUCOSE: 120 mg/dL
Hgb A1c MFr Bld: 5.8 % — ABNORMAL HIGH (ref 4.8–5.6)

## 2015-12-15 LAB — ANA: Anti Nuclear Antibody(ANA): NEGATIVE

## 2015-12-15 LAB — TSH: TSH: 1.32 u[IU]/mL (ref 0.450–4.500)

## 2015-12-15 LAB — CK: Total CK: 59 U/L (ref 24–204)

## 2015-12-15 LAB — SEDIMENTATION RATE: Sed Rate: 14 mm/hr (ref 0–30)

## 2015-12-15 LAB — LIPID PANEL
CHOL/HDL RATIO: 4 ratio (ref 0.0–5.0)
CHOLESTEROL TOTAL: 189 mg/dL (ref 100–199)
HDL: 47 mg/dL (ref >39)
LDL CALC: 99 mg/dL (ref 0–99)
TRIGLYCERIDES: 215 mg/dL — AB (ref 0–149)
VLDL Cholesterol Cal: 43 mg/dL — ABNORMAL HIGH (ref 5–40)

## 2015-12-16 ENCOUNTER — Other Ambulatory Visit: Payer: Self-pay

## 2015-12-16 MED ORDER — HYDROCODONE-ACETAMINOPHEN 7.5-325 MG PO TABS: ORAL_TABLET | ORAL | Status: DC

## 2015-12-16 NOTE — Telephone Encounter (Signed)
Print[facility] the [facility]. Called patient to let him know he can pick Rx up

## 2015-12-20 ENCOUNTER — Encounter: Payer: Self-pay | Admitting: Internal Medicine

## 2015-12-20 ENCOUNTER — Telehealth: Payer: Self-pay

## 2015-12-20 NOTE — Telephone Encounter (Signed)
Patient spoke with his pharmacist and an alternative to oxycodone is dilaudid  8 mg 1 by mouth every 3 hours. Patient would like to know if Dr.Green will ok a 1 week supply of dilaudid to see if its effective and if yes patient would like to switch to dilaudid in place of oxycodone.  Please advise

## 2015-12-21 ENCOUNTER — Other Ambulatory Visit: Payer: Self-pay | Admitting: Internal Medicine

## 2015-12-21 ENCOUNTER — Telehealth: Payer: Self-pay

## 2015-12-21 DIAGNOSIS — G8929 Other chronic pain: Secondary | ICD-10-CM

## 2015-12-21 MED ORDER — HYDROMORPHONE HCL 8 MG PO TABS: ORAL_TABLET | ORAL | Status: DC

## 2015-12-21 NOTE — Telephone Encounter (Signed)
RX faxed to [facility]

## 2015-12-21 NOTE — Telephone Encounter (Signed)
Patient called, he hasn't received his [facility]audid[facility] can't take it, it has to be picked up and brought in, can't take it over the phone. Called patient to let him know to pick it up tomorrow. Re-print Rx.

## 2015-12-21 NOTE — Telephone Encounter (Signed)
It is Ok to try this substitution: Dilaudid 8 mg q3h for 7 days.

## 2015-12-22 ENCOUNTER — Other Ambulatory Visit: Payer: Self-pay | Admitting: Internal Medicine

## 2015-12-22 ENCOUNTER — Telehealth: Payer: Self-pay | Admitting: Internal Medicine

## 2015-12-22 ENCOUNTER — Encounter: Payer: Self-pay | Admitting: Internal Medicine

## 2015-12-26 DIAGNOSIS — I10 Essential (primary) hypertension: Secondary | ICD-10-CM | POA: Diagnosis not present

## 2015-12-26 DIAGNOSIS — K861 Other chronic pancreatitis: Secondary | ICD-10-CM | POA: Diagnosis not present

## 2015-12-26 DIAGNOSIS — E1151 Type 2 diabetes mellitus with diabetic peripheral angiopathy without gangrene: Secondary | ICD-10-CM | POA: Diagnosis not present

## 2015-12-26 DIAGNOSIS — Z6827 Body mass index (BMI) 27.0-27.9, adult: Secondary | ICD-10-CM | POA: Diagnosis not present

## 2015-12-26 DIAGNOSIS — M791 Myalgia: Secondary | ICD-10-CM | POA: Diagnosis not present

## 2015-12-26 DIAGNOSIS — R634 Abnormal weight loss: Secondary | ICD-10-CM | POA: Diagnosis not present

## 2015-12-26 DIAGNOSIS — G8929 Other chronic pain: Secondary | ICD-10-CM | POA: Diagnosis not present

## 2015-12-26 DIAGNOSIS — Z79891 Long term (current) use of opiate analgesic: Secondary | ICD-10-CM | POA: Diagnosis not present

## 2015-12-28 ENCOUNTER — Telehealth: Payer: Self-pay | Admitting: *Deleted

## 2015-12-28 ENCOUNTER — Ambulatory Visit: Payer: Medicare Other | Admitting: Internal Medicine

## 2015-12-28 ENCOUNTER — Other Ambulatory Visit: Payer: Self-pay | Admitting: Internal Medicine

## 2015-12-28 ENCOUNTER — Telehealth: Payer: Self-pay | Admitting: Internal Medicine

## 2015-12-28 DIAGNOSIS — M791 Myalgia: Secondary | ICD-10-CM | POA: Diagnosis not present

## 2015-12-28 DIAGNOSIS — K861 Other chronic pancreatitis: Secondary | ICD-10-CM | POA: Diagnosis not present

## 2015-12-28 DIAGNOSIS — G8929 Other chronic pain: Secondary | ICD-10-CM

## 2015-12-28 DIAGNOSIS — I10 Essential (primary) hypertension: Secondary | ICD-10-CM | POA: Diagnosis not present

## 2015-12-28 DIAGNOSIS — R634 Abnormal weight loss: Secondary | ICD-10-CM | POA: Diagnosis not present

## 2015-12-28 DIAGNOSIS — E1151 Type 2 diabetes mellitus with diabetic peripheral angiopathy without gangrene: Secondary | ICD-10-CM | POA: Diagnosis not present

## 2015-12-28 MED ORDER — HYDROMORPHONE HCL 8 MG PO TABS: ORAL_TABLET | ORAL | Status: DC

## 2015-12-28 NOTE — Telephone Encounter (Signed)
Patient called and stated that he wants more than a weeks worth of Dilaudid 8mg . Stated that he needs more to get a good trial, even though he doesn't think anything is going to help. He also stated that he will send his neighbor to pick up the Rx. Please Advise.

## 2015-12-28 NOTE — Telephone Encounter (Signed)
Return the prior prescription. I wrote another for 2 weeks.

## 2015-12-28 NOTE — Telephone Encounter (Signed)
Noted.  No new orders

## 2015-12-28 NOTE — Telephone Encounter (Signed)
Austin Barnes with [facility] called and stated that he saw patient today and his blood pressure was 170/84 and his pain level was 8/10. He wanted to make you aware.

## 2015-12-28 NOTE — Telephone Encounter (Signed)
[name] called on 12/28/2015 at about 8:15 am and stated to me to tell the manager Daine GipCynthia Davis not to call him anymore. [name] said if Dr. Chilton SiGreen wanted to talk to him, he could call him. [name] also said to tell Mrs. Earlene PlaterDavis if she called he was not going to pick up the phone and that there would be trouble if she called him again. [name] said he was tired of people threatening him. I gave Daine GipCynthia Davis this message when she arrived this am. He stated that he used to have friends here, but does not have any friends here anymore.

## 2015-12-28 NOTE — Telephone Encounter (Signed)
Patient notified and has already used the Rx. Dr Chilton SiGreen made aware. To give this Rx for #115 to last until Dr. Chilton SiGreen gets back from vacation.

## 2015-12-29 ENCOUNTER — Telehealth: Payer: Self-pay | Admitting: Internal Medicine

## 2015-12-29 NOTE — Telephone Encounter (Signed)
FYI, I received this message from [facility] regarding their RN visit with Austin Barnes   Note from our RN: I just completed his soc. We had to go to his neighbors house directly across the hall due to the hoard in his home. He takes ALOT of pain meds. Speech is rambling and he is forgetful. He said he doesn't need therapy and was under the impression the doc was sending someone to help clean his house. He said he thought nursing was coming every day. I had a discussion about the services we offer and what his doc ordered. He still says he doesn't want therapy bc he is unable to get out of bed.....although he was dressed and met me at the door and then we went to the neighbors...Marland KitchenMarland KitchenI told him therapy would at least call him and go from there. I'm not sure what progress any of [place] can make with him. He says he has been sick since his 3s and has everything known to man wrong with him. Anyway, just a heads up.

## 2015-12-29 NOTE — Telephone Encounter (Signed)
noted 

## 2015-12-30 ENCOUNTER — Other Ambulatory Visit: Payer: Self-pay | Admitting: Internal Medicine

## 2016-01-02 ENCOUNTER — Other Ambulatory Visit: Payer: Self-pay | Admitting: *Deleted

## 2016-01-02 MED ORDER — LIDOCAINE 5 % EX OINT: TOPICAL_OINTMENT | CUTANEOUS | Status: DC

## 2016-01-02 NOTE — Telephone Encounter (Signed)
Pharmacy requested Rx to state needs 5 tubes for 30 day supply.

## 2016-01-03 ENCOUNTER — Telehealth: Payer: Self-pay

## 2016-01-03 NOTE — Telephone Encounter (Signed)
Received a prior authorization request for lidocaine 5% ointment. Instructions: Dispense 5 tubes for a 30 day supply. Apply twice daily as needed for pain.   Prior authorization was initiated via [email or web address]. Keyword: [id number]   Patient ID# [id number] [facility]    Awaiting determination.

## 2016-01-04 ENCOUNTER — Telehealth: Payer: Self-pay | Admitting: Internal Medicine

## 2016-01-04 NOTE — Telephone Encounter (Signed)
This can wait on Dr Chilton SiGreen

## 2016-01-04 NOTE — Telephone Encounter (Signed)
FYI I received [facility]  Satira SarkDavid Pereira is trying to get out of visits. Called after this and said he doesn't want it, and apologized for accidently blocking our number and the nurses number. Said he didn't know how to unblock it. I can come by and play his message for you. What now?

## 2016-01-06 NOTE — Telephone Encounter (Signed)
Received fax from [facility] [employer] and Lidocaine Ointment was APPROVED 10/05/15-04/02/16

## 2016-01-10 NOTE — Telephone Encounter (Signed)
If Austin Barnes is refusing to have these visits, that is purely his choice. It is now been documented in his record.

## 2016-01-11 ENCOUNTER — Ambulatory Visit (INDEPENDENT_AMBULATORY_CARE_PROVIDER_SITE_OTHER): Payer: Medicare Other | Admitting: Internal Medicine

## 2016-01-11 ENCOUNTER — Encounter: Payer: Self-pay | Admitting: Internal Medicine

## 2016-01-11 VITALS — BP 210/108 | HR 89 | Temp 98.7°F | Ht 69.0 in | Wt 183.0 lb

## 2016-01-11 DIAGNOSIS — I739 Peripheral vascular disease, unspecified: Secondary | ICD-10-CM | POA: Diagnosis not present

## 2016-01-11 DIAGNOSIS — I10 Essential (primary) hypertension: Secondary | ICD-10-CM | POA: Diagnosis not present

## 2016-01-11 DIAGNOSIS — R634 Abnormal weight loss: Secondary | ICD-10-CM | POA: Diagnosis not present

## 2016-01-11 DIAGNOSIS — G8929 Other chronic pain: Secondary | ICD-10-CM | POA: Diagnosis not present

## 2016-01-11 MED ORDER — HYDROCODONE-ACETAMINOPHEN 7.5-325 MG PO TABS: ORAL_TABLET | ORAL | Status: DC

## 2016-01-11 MED ORDER — METHADONE HCL 10 MG PO TABS: ORAL_TABLET | ORAL | Status: DC

## 2016-01-11 MED ORDER — DIAZEPAM 10 MG PO TABS: ORAL_TABLET | ORAL | Status: DC

## 2016-01-11 MED ORDER — OXYCODONE HCL 10 MG PO TABS: ORAL_TABLET | ORAL | Status: DC

## 2016-01-11 MED ORDER — TRAMADOL HCL 50 MG PO TABS: ORAL_TABLET | ORAL | Status: DC

## 2016-01-11 NOTE — Progress Notes (Signed)
Patient ID: Austin Barnes, male   DOB: 1950-08-21, 65 y.o.   MRN: [id number]    Facility  [place]    Place of Service:   OFFICE    Allergies  Allergen Reactions  . Codeine   . Demerol   . Disalcid [Salsalate]   . Feldene [Piroxicam]   . Penicillins     Has patient had a PCN reaction causing immediate rash, facial/tongue/throat swelling, SOB or lightheadedness with hypotension: NO Has patient had a PCN reaction causing severe rash involving mucus membranes or skin necrosis: NO Has patient had a PCN reaction that required hospitalization NO Has patient had a PCN reaction occurring within the last 10 years: No If all of the above answers are "NO", then may proceed with Cephalosporin use.  Ignacia Bayley Antibiotics     Chief Complaint  Patient presents with  . Medical Management of Chronic Issues    2 week follow-up on weight loss, chronic pain, change of medication. Labs    HPI:  Patient was last seen 4 weeks ago. He complained of feeling quite ill. He had lost a lot of weight. He was having difficulty with breathing and spitting up Pixie Burgener material. He also had watery postprandial stools which he believed was a chronic problem.  Patient has chronic pain related predominantly to his adhesive arachnoiditis, but with other pains in his knees and back and sometimes abdomen. Patient had been discussing a change in his pain medications to Dilaudid 8 mg every 8 hours from his long-standing dose of oxycodone.Current pain medications including methadone, oxycodone, Valium, tramadol, and hydrocodone 7.5/325.  Patient has refused referral to pain clinic and refused referral to neurosurgeon to reevaluate his adhesive arachnoiditis.  Lab work done the time of his last visit included a CBC, CMP, TSH, lipid panel, hemoglobin A1c, urinalysis, CK, sedimentation rate, ANA, amylase, lipase, and urine culture. Test results were surprisingly good. Amylase and lipase were not done as ordered.  BP is quite high  today, but he has upset and somewhat angry.  Medications: Patient's Medications  New Prescriptions   No medications on file  Previous Medications   ANDROGEL PUMP 20.25 MG/ACT (1.62%) GEL    APPLY 2 PUMP ACTUATIONS DAILY FOR TESTOSTERONE REPLACEMENT.   BACLOFEN (LIORESAL) 10 MG TABLET    TAKE 1 TABLET THREE TIMES DAILY AS NEEDED FOR MUSCLE RELAXATION.   DIAZEPAM (VALIUM) 10 MG TABLET    TAKE 1 TABLET UP TO FOUR TIMES DAILY TO RELAX MUSCLES.   DICLOFENAC SODIUM (VOLTAREN) 1 % GEL    APPLY 2-4GM TO AFFECTED AREA UP TO 4 TIMES A DAY   HYDROCODONE-ACETAMINOPHEN (NORCO) 7.5-325 MG TABLET    Take one tablet every 6 hour if needed for pain   HYDROMORPHONE (DILAUDID) 8 MG TABLET    Take one tablet every 3 hours to control pain   LEVOTHYROXINE (SYNTHROID, LEVOTHROID) 50 MCG TABLET    TAKE 1 TABLET EACH DAY FOR THYROID.   LIDOCAINE (XYLOCAINE) 5 % OINTMENT    Apply twice daily as needed for pain   LOSARTAN (COZAAR) 50 MG TABLET    One daily to control BP   METHADONE (DOLOPHINE) 10 MG TABLET    Take four tablets every 6 hours to control pain   MINOXIDIL (LONITEN) 10 MG TABLET    TAKE 1 TABLET DAILY TO CONTROL BLOOD PRESSURE.   NITRO-BID 2 % OINTMENT    APPLY 1 INCH STRIP TO FEET TWICE DAILY.   OMEPRAZOLE (PRILOSEC) 20 MG CAPSULE  TAKE (2) CAPSULES TWICE DAILY.   OXYCODONE HCL 10 MG TABS    Take four tablets by mouth every 6 hours as needed for pain   SF 5000 PLUS 1.1 % CREA DENTAL CREAM    BRUSH ON TEETH FOR 2 MINUTES THEN SWISH AND EXPECTORATE.   TEMAZEPAM (RESTORIL) 30 MG CAPSULE    TAKE 1 CAPSULE ONCE DAILY AT BEDTIME AS NEEDED FOR REST.   TRAMADOL (ULTRAM) 50 MG TABLET    TAKE 1 TABLET UP TO FOUR TIMES DAILY AS NEEDED FOR PAIN.  Modified Medications   No medications on file  Discontinued Medications   No medications on file    Review of Systems  Constitutional: Positive for activity change and unexpected weight change (25# loss since Dec 2016 weight of 205#). Negative for appetite change.        Chronically ill  HENT: Negative for congestion and rhinorrhea.   Eyes: Positive for visual disturbance.  Respiratory: Positive for cough and shortness of breath.        Producing sputum  Cardiovascular: Positive for leg swelling. Negative for chest pain and palpitations.       History of peripheral artery disease  Gastrointestinal: Positive for diarrhea.       History of chronic and recurrent pancreatitis. Loose stools from time to time. Upper GI distress and epigastric pain.  Endocrine: Positive for polydipsia.       History of diabetes mellitus.  Musculoskeletal: Positive for myalgias, back pain, joint swelling (Right knee), arthralgias, gait problem, neck pain and neck stiffness.       Pain in both hips.  Skin: Negative.   Allergic/Immunologic: Negative.   Neurological: Positive for dizziness, weakness and numbness.  Hematological:       History of anemia.  Psychiatric/Behavioral: Positive for dysphoric mood, decreased concentration and agitation. Negative for suicidal ideas, hallucinations, behavioral problems and confusion. The patient is nervous/anxious.     Filed Vitals:   01/11/16 1527  BP: 210/108  Pulse: 89  Temp: 98.7 F (37.1 C)  TempSrc: Oral  Height: 5' 9"  (1.753 m)  Weight: 183 lb (83.008 kg)  SpO2: 98%   Body mass index is 27.01 kg/(m^2). Filed Weights   01/11/16 1527  Weight: 183 lb (83.008 kg)    12/14/15 = 180#   06/28/15 = 205#   Physical Exam  Constitutional: He is oriented to person, place, and time.  Mesomorphic. Rambling in thought content. Chronically ill appearance.  HENT:  Head: Normocephalic and atraumatic.  Right Ear: External ear normal.  Left Ear: External ear normal.  Nose: Nose normal.  Mouth/Throat: Oropharynx is clear and moist.  Eyes: Conjunctivae and EOM are normal. Pupils are equal, round, and reactive to light.  Neck: No JVD present. No tracheal deviation present. No thyromegaly present.  Cardiovascular: Normal rate, regular  rhythm and normal heart sounds.  Exam reveals no gallop and no friction rub.   No murmur heard. Pulmonary/Chest: No respiratory distress. He has no wheezes. He has no rales.  Abdominal: He exhibits no distension and no mass. There is no tenderness.  Musculoskeletal: He exhibits edema and tenderness.  Muscular weakness and pains. Unstable gait. Uses walker. Velcro brace on the right knee. Ganglion cyst left wrist dorsum. Exquisitely tender to palpation at the left elbow malleolus.  Lymphadenopathy:    He has no cervical adenopathy.  Neurological: He is alert and oriented to person, place, and time. He displays abnormal reflex. No cranial nerve deficit. Coordination abnormal.  Skin: No rash noted.  No erythema. There is pallor.  Psychiatric:  Rambling speech. Frustrated.  Vitals reviewed.   Labs reviewed: Lab Summary Latest Ref Rng 12/14/2015  Hemoglobin 12.6 - 17.7 g/dL 12.8  Hematocrit 37.5 - 51.0 % 38.4  White count 3.4 - 10.8 x10E3/uL 6.3  Platelet count 150 - 379 x10E3/uL 303  Sodium 134 - 144 mmol/L 138  Potassium 3.5 - 5.2 mmol/L 4.4  Calcium 8.6 - 10.2 mg/dL 9.9  Phosphorus - (None)  Creatinine 0.76 - 1.27 mg/dL 1.16  AST 0 - 40 IU/L 21  Alk Phos 39 - 117 IU/L 93  Bilirubin 0.0 - 1.2 mg/dL 0.2  Glucose 65 - 99 mg/dL 103(H)  Cholesterol - (None)  HDL cholesterol >39 mg/dL 47  Triglycerides 0 - 149 mg/dL 215(H)  LDL Direct - (None)  LDL Calc 0 - 99 mg/dL 99  Total protein - (None)  Albumin 3.6 - 4.8 g/dL 4.7   Lab Results  Component Value Date   TSH 1.320 12/14/2015   TSH 3.890 12/14/2014   TSH 2.080 01/27/2014   Lab Results  Component Value Date   BUN 16 12/14/2015   BUN 17 12/14/2014   BUN 20 01/27/2014   Lab Results  Component Value Date   HGBA1C 5.8* 12/14/2015   HGBA1C 5.8* 12/14/2014   HGBA1C 6.0* 01/27/2014    Assessment/Plan  1. Chronic pain continue current medications  2. Essential hypertension Take medications as directed: minoxidil,  losartan  3. Loss of weight monitor  4. Peripheral vascular disease ([place]) Continues with pain in the feet. May need reevaluation at [facility].. He is reluctant to schedule appt there.

## 2016-01-13 ENCOUNTER — Other Ambulatory Visit: Payer: Self-pay | Admitting: Internal Medicine

## 2016-01-15 ENCOUNTER — Other Ambulatory Visit: Payer: Self-pay | Admitting: Internal Medicine

## 2016-01-18 ENCOUNTER — Other Ambulatory Visit: Payer: Self-pay | Admitting: Internal Medicine

## 2016-01-18 MED ORDER — TEMAZEPAM 30 MG PO CAPS: ORAL_CAPSULE | ORAL | Status: DC

## 2016-01-19 ENCOUNTER — Other Ambulatory Visit: Payer: Self-pay | Admitting: Internal Medicine

## 2016-02-08 ENCOUNTER — Other Ambulatory Visit: Payer: Self-pay | Admitting: Internal Medicine

## 2016-02-10 NOTE — Telephone Encounter (Signed)
Called patient, no answer. Reason for call: Patient will need to sign contract prior to refills

## 2016-02-10 NOTE — Telephone Encounter (Signed)
Patient aware Dr.Green is out of the office and the other providers will not sign rx's with no controlled substance contract on file.   Patient called back, patient states he has taking medications for 40 years and never had to sign a controlled substance contract.   Patient informed of the importance of signing a contract as mentioned in letter that was mailed to him in September 2016. I instructed patient to come into the office today and we will go over contract and have him sign. Patient refused and states he is out of town.    Patient states we will here from his attorney and ended call.    I called Holly at [facility] and informed her that we received request and are awaiting approval when Dr.Green return's in office

## 2016-02-14 ENCOUNTER — Other Ambulatory Visit: Payer: Self-pay | Admitting: Internal Medicine

## 2016-02-14 DIAGNOSIS — G8929 Other chronic pain: Secondary | ICD-10-CM

## 2016-02-14 MED ORDER — HYDROCODONE-ACETAMINOPHEN 7.5-325 MG PO TABS: ORAL_TABLET | ORAL | 0 refills | Status: DC

## 2016-02-14 MED ORDER — METHADONE HCL 10 MG PO TABS: ORAL_TABLET | ORAL | 0 refills | Status: DC

## 2016-02-14 MED ORDER — OXYCODONE HCL 10 MG PO TABS: ORAL_TABLET | ORAL | 0 refills | Status: DC

## 2016-02-14 NOTE — Telephone Encounter (Signed)
[facility] Requested and Patient requested pain medication to be printed and neighbor will pick up.

## 2016-02-29 ENCOUNTER — Other Ambulatory Visit: Payer: Self-pay

## 2016-02-29 ENCOUNTER — Other Ambulatory Visit: Payer: Self-pay | Admitting: Internal Medicine

## 2016-02-29 MED ORDER — TESTOSTERONE 20.25 MG/ACT (1.62%) TD GEL: TRANSDERMAL | 0 refills | Status: DC

## 2016-03-06 ENCOUNTER — Other Ambulatory Visit: Payer: Self-pay | Admitting: Internal Medicine

## 2016-03-07 ENCOUNTER — Other Ambulatory Visit: Payer: Self-pay | Admitting: Internal Medicine

## 2016-03-08 ENCOUNTER — Other Ambulatory Visit: Payer: Self-pay | Admitting: Internal Medicine

## 2016-03-09 ENCOUNTER — Other Ambulatory Visit: Payer: Self-pay | Admitting: Internal Medicine

## 2016-03-12 ENCOUNTER — Other Ambulatory Visit: Payer: Self-pay | Admitting: Internal Medicine

## 2016-03-12 DIAGNOSIS — G8929 Other chronic pain: Secondary | ICD-10-CM

## 2016-03-12 NOTE — Telephone Encounter (Signed)
Dr.Green please review and advise

## 2016-03-13 ENCOUNTER — Other Ambulatory Visit: Payer: Self-pay | Admitting: Internal Medicine

## 2016-03-13 ENCOUNTER — Other Ambulatory Visit: Payer: Self-pay

## 2016-03-13 MED ORDER — NITROGLYCERIN 2 % TD OINT: TOPICAL_OINTMENT | TRANSDERMAL | 0 refills | Status: DC

## 2016-03-13 NOTE — Telephone Encounter (Signed)
Received interface request for nitro-bid 2% ointment. Refill was sent to pharmacy electronically.

## 2016-03-14 ENCOUNTER — Other Ambulatory Visit: Payer: Self-pay | Admitting: *Deleted

## 2016-03-14 DIAGNOSIS — G8929 Other chronic pain: Secondary | ICD-10-CM

## 2016-03-14 MED ORDER — OXYCODONE HCL 10 MG PO TABS: ORAL_TABLET | ORAL | 0 refills | Status: DC

## 2016-03-14 MED ORDER — METHADONE HCL 10 MG PO TABS: ORAL_TABLET | ORAL | 0 refills | Status: DC

## 2016-03-14 MED ORDER — HYDROCODONE-ACETAMINOPHEN 7.5-325 MG PO TABS: ORAL_TABLET | ORAL | 0 refills | Status: DC

## 2016-03-14 NOTE — Telephone Encounter (Signed)
Patient requested and will pick up 

## 2016-03-22 ENCOUNTER — Other Ambulatory Visit: Payer: Self-pay | Admitting: Internal Medicine

## 2016-03-26 ENCOUNTER — Other Ambulatory Visit: Payer: Self-pay | Admitting: Internal Medicine

## 2016-03-26 ENCOUNTER — Other Ambulatory Visit: Payer: Self-pay | Admitting: *Deleted

## 2016-03-26 MED ORDER — LIDOCAINE 5 % EX OINT: TOPICAL_OINTMENT | CUTANEOUS | 0 refills | Status: DC

## 2016-03-26 NOTE — Telephone Encounter (Signed)
[facility]  

## 2016-03-27 ENCOUNTER — Telehealth: Payer: Self-pay | Admitting: *Deleted

## 2016-03-27 NOTE — Telephone Encounter (Signed)
I would like to have this information in order to scan it into [place] link.

## 2016-03-27 NOTE — Telephone Encounter (Signed)
I called patient and spoke with him and he stated that he would send the information to the office.   Also, Dr. Chilton SiGreen received a letter from Onalee Huaavid to his personal address. Dr. Chilton SiGreen stated that he will not increase Teddy's pain medications.  Patient notified and letter sent for scanning.

## 2016-03-27 NOTE — Telephone Encounter (Signed)
Patient called and stated that he found the paperwork where he had the 6 hour MRI with and without contrast. Stated that if you wanted it he would drop it off. 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 Please Advise.

## 2016-03-28 ENCOUNTER — Telehealth: Payer: Self-pay

## 2016-03-28 NOTE — Telephone Encounter (Signed)
Patient called about pain in legs, unable to walk, wanted me to give Dr. Chilton SiGreen a message. Told patient Dr. Chilton SiGreen is out of the office today until 04/17/16. Asked if he wanted to go to Neurologist, he said no, doesn't have any money. Offer appt with one of the other doctors in our office,  he refused, "he'll take care of it himself".

## 2016-04-05 ENCOUNTER — Other Ambulatory Visit: Payer: Self-pay | Admitting: Internal Medicine

## 2016-04-09 ENCOUNTER — Other Ambulatory Visit: Payer: Self-pay | Admitting: Internal Medicine

## 2016-04-10 ENCOUNTER — Other Ambulatory Visit: Payer: Self-pay | Admitting: Internal Medicine

## 2016-04-11 ENCOUNTER — Telehealth: Payer: Self-pay

## 2016-04-11 ENCOUNTER — Other Ambulatory Visit: Payer: Self-pay

## 2016-04-11 ENCOUNTER — Other Ambulatory Visit: Payer: Self-pay | Admitting: *Deleted

## 2016-04-11 MED ORDER — DIAZEPAM 10 MG PO TABS: ORAL_TABLET | ORAL | 0 refills | Status: DC

## 2016-04-11 MED ORDER — TRAMADOL HCL 50 MG PO TABS: ORAL_TABLET | ORAL | 0 refills | Status: DC

## 2016-04-11 MED ORDER — PROCHLORPERAZINE MALEATE 10 MG PO TABS: 10.0000 mg | ORAL_TABLET | Freq: Three times a day (TID) | ORAL | 0 refills | Status: DC | PRN

## 2016-04-11 NOTE — Telephone Encounter (Signed)
Reprinted Compazine, Diazepam, Ultram because the doctors will not sign 30 day  Rx, Dr. Alwyn RenHopper will sign enough until Dr. Chilton SiGreen gets back 04/17/16

## 2016-04-11 NOTE — Telephone Encounter (Addendum)
Patient called back, told him that the doctor signed his 3 Rxs enough until Dr. Chilton SiGreen gets back 04/17/16. Patient was ok with this. He then told me he needs refills on all his narcotics. I told the patient we will not be able to print Rx's for control substances, until he signs the narcotic agreement contract. We had a long discussion on this. I told him we have been talking about this since 2016, we have mailed a letter to him about this. He needs his Rx's tomorrow, they were written 03/14/16, per Daine Gipynthia Davis office manager we can print them on Friday 04/13/16 after he signs the contract or wait until Dr. Chilton SiGreen comes back Tuesday 04/17/16. We can also wait on the Compazine, Ultram and Diazepam until Friday and write the full amount. [facility] and called me back, go ahead and fax the Rx's over for the 3 today. He agreed to come in Friday to sign the control substances contract and wait in the waiting room until the narcotic Rx's are printed and signed.

## 2016-04-11 NOTE — Telephone Encounter (Signed)
Left message for patient to call about the 3 Rx's we received from [facility] for refill.

## 2016-04-13 ENCOUNTER — Other Ambulatory Visit: Payer: Self-pay | Admitting: *Deleted

## 2016-04-13 ENCOUNTER — Encounter: Payer: Self-pay | Admitting: *Deleted

## 2016-04-13 ENCOUNTER — Telehealth: Payer: Self-pay

## 2016-04-13 ENCOUNTER — Ambulatory Visit (INDEPENDENT_AMBULATORY_CARE_PROVIDER_SITE_OTHER): Payer: Medicare Other

## 2016-04-13 DIAGNOSIS — G8929 Other chronic pain: Secondary | ICD-10-CM

## 2016-04-13 DIAGNOSIS — Z23 Encounter for immunization: Secondary | ICD-10-CM

## 2016-04-13 MED ORDER — HYDROCODONE-ACETAMINOPHEN 7.5-325 MG PO TABS: ORAL_TABLET | ORAL | 0 refills | Status: DC

## 2016-04-13 MED ORDER — OXYCODONE HCL 10 MG PO TABS: ORAL_TABLET | ORAL | 0 refills | Status: DC

## 2016-04-13 MED ORDER — TEMAZEPAM 30 MG PO CAPS: ORAL_CAPSULE | ORAL | 3 refills | Status: DC

## 2016-04-13 MED ORDER — METHADONE HCL 10 MG PO TABS: ORAL_TABLET | ORAL | 0 refills | Status: DC

## 2016-04-13 NOTE — Telephone Encounter (Signed)
Patient requested and will pick up 

## 2016-04-13 NOTE — Telephone Encounter (Signed)
Patient requested and will pick up. Narcotic Contract Signed.

## 2016-04-13 NOTE — Telephone Encounter (Signed)
A prior authorization request was received from [facility] for oxycodone 10 mg tablets. #480 no RF.   Prior authorization was initiated by calling [phone]. Patient ID # [id number].   Prior authorization was approved until 04-13-17. An approval letter was faxed from the insurance company that verified the approval.   Approval # [id number].   Pharmacy has been notified of the approval and they stated that the insurance claim did go through while I was on the phone with them.

## 2016-04-14 ENCOUNTER — Other Ambulatory Visit: Payer: Self-pay | Admitting: Internal Medicine

## 2016-04-16 ENCOUNTER — Other Ambulatory Visit: Payer: Self-pay | Admitting: Internal Medicine

## 2016-04-18 ENCOUNTER — Ambulatory Visit (INDEPENDENT_AMBULATORY_CARE_PROVIDER_SITE_OTHER): Payer: Medicare Other | Admitting: Internal Medicine

## 2016-04-18 ENCOUNTER — Other Ambulatory Visit: Payer: Self-pay | Admitting: Internal Medicine

## 2016-04-18 ENCOUNTER — Encounter: Payer: Self-pay | Admitting: Internal Medicine

## 2016-04-18 VITALS — BP 168/82 | HR 60 | Temp 98.4°F

## 2016-04-18 DIAGNOSIS — E1151 Type 2 diabetes mellitus with diabetic peripheral angiopathy without gangrene: Secondary | ICD-10-CM

## 2016-04-18 DIAGNOSIS — I10 Essential (primary) hypertension: Secondary | ICD-10-CM

## 2016-04-18 DIAGNOSIS — G894 Chronic pain syndrome: Secondary | ICD-10-CM

## 2016-04-18 DIAGNOSIS — R634 Abnormal weight loss: Secondary | ICD-10-CM | POA: Diagnosis not present

## 2016-04-18 DIAGNOSIS — G039 Meningitis, unspecified: Secondary | ICD-10-CM

## 2016-04-18 DIAGNOSIS — J209 Acute bronchitis, unspecified: Secondary | ICD-10-CM | POA: Insufficient documentation

## 2016-04-18 DIAGNOSIS — G8929 Other chronic pain: Secondary | ICD-10-CM

## 2016-04-18 MED ORDER — HYDROMORPHONE HCL 8 MG PO TABS: ORAL_TABLET | ORAL | 0 refills | Status: DC

## 2016-04-18 NOTE — Addendum Note (Signed)
Addended by: Charna ElizabethWILLIAMS, DEBRA J on: 04/18/2016 03:21 PM   Modules accepted: Orders

## 2016-04-18 NOTE — Progress Notes (Signed)
Facility  [facility]    Place of Service:   OFFICE    Allergies  Allergen Reactions  . Codeine   . Demerol   . Disalcid [Salsalate]   . Feldene [Piroxicam]   . Penicillins     Has patient had a PCN reaction causing immediate rash, facial/tongue/throat swelling, SOB or lightheadedness with hypotension: NO Has patient had a PCN reaction causing severe rash involving mucus membranes or skin necrosis: NO Has patient had a PCN reaction that required hospitalization NO Has patient had a PCN reaction occurring within the last 10 years: No If all of the above answers are "NO", then may proceed with Cephalosporin use.  [name] Antibiotics     Chief Complaint  Patient presents with  . 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 Has not been well controlled even on multiple high dose narcotics  Loss of weight - stable  Acute bronchitis, unspecified organism - cough increased, wheeze, yellow thick sputum. 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   BACLOFEN (LIORESAL) 10 MG TABLET    TAKE 1 TABLET THREE TIMES DAILY AS NEEDED FOR MUSCLE RELAXATION.   DENTA 5000 PLUS 1.1 % CREA DENTAL CREAM    BRUSH ON TEETH FOR 2 MINUTES THEN SWISH AND EXPECTORATE.   DIAZEPAM (VALIUM) 10 MG TABLET    TAKE 1 TABLET UP TO FOUR TIMES DAILY TO RELAX MUSCLES.   HYDROCODONE-ACETAMINOPHEN (NORCO) 7.5-325 MG TABLET    Take one tablet every 6 hour if needed for pain   HYDROMORPHONE (DILAUDID) 8 MG TABLET    Take one  tablet every 3 hours to control pain   LEVOTHYROXINE (SYNTHROID, LEVOTHROID) 50 MCG TABLET    TAKE 1 TABLET EACH DAY FOR THYROID.   LIDOCAINE (XYLOCAINE) 5 % OINTMENT    Apply twice daily as needed for pain. 5 tubes =30 days   LOSARTAN (COZAAR) 50 MG TABLET    One daily to control BP   METHADONE (DOLOPHINE) 10 MG TABLET    Take four tablets every 6 hours to control pain   MINOXIDIL (LONITEN) 10 MG TABLET    TAKE 1 TABLET DAILY TO CONTROL BLOOD PRESSURE.   NITROGLYCERIN (NITRO-BID) 2 % OINTMENT    Apply 1 inch strip to feet twice daily.   OMEPRAZOLE (PRILOSEC) 20 MG CAPSULE    TAKE (2) CAPSULES TWICE DAILY.   OXYCODONE HCL 10 MG TABS    Take four tablets by mouth every 6 hours as needed for pain   PROCHLORPERAZINE (COMPAZINE) 10 MG TABLET    Take 1 tablet (10 mg total) by mouth every 8 (eight) hours as needed.   TEMAZEPAM (RESTORIL) 30 MG CAPSULE    TAKE 1 CAPSULE ONCE DAILY AT BEDTIME AS NEEDED FOR REST.   TRAMADOL (ULTRAM) 50 MG TABLET    TAKE 1 TABLET UP TO FOUR TIMES DAILY AS NEEDED FOR PAIN.   VOLTAREN 1 % GEL    APPLY 2-4GM TO AFFECTED AREA UP TO 4 TIMES A DAY  Modified  Medications   No medications on file  Discontinued Medications   No medications on file    Review of Systems  Constitutional: Positive for activity change and unexpected weight change (25# loss since Dec 2016 weight of 205#). Negative for appetite change.       Chronically ill  HENT: Negative for congestion and rhinorrhea.   Eyes: Positive for visual disturbance.  Respiratory: Positive for cough, chest tightness and shortness of breath.        Producing sputum  Cardiovascular: Positive for leg swelling. Negative for chest pain and palpitations.       History of peripheral artery disease  Gastrointestinal: Positive for diarrhea.       History of chronic and recurrent pancreatitis. Loose stools from time to time. Upper GI distress and epigastric pain.  Endocrine: Positive for polydipsia.       History of diabetes  mellitus.  Musculoskeletal: Positive for arthralgias, back pain, gait problem, joint swelling (Right knee), myalgias, neck pain and neck stiffness.       Pain in both hips.  Skin: Negative.   Allergic/Immunologic: Negative.   Neurological: Positive for dizziness, weakness and numbness.  Hematological:       History of anemia.  Psychiatric/Behavioral: Positive for agitation, decreased concentration and dysphoric mood. Negative for behavioral problems, confusion, hallucinations and suicidal ideas. The patient is nervous/anxious.     Vitals:   04/18/16 1615  BP: (!) 168/82  Pulse: 60  Temp: 98.4 F (36.9 C)  TempSrc: Oral  SpO2: 98%   There is no height or weight on file to calculate BMI. Wt Readings from Last 3 Encounters:  01/11/16 183 lb (83 kg)  12/14/15 180 lb (81.6 kg)  06/28/15 205 lb (93 kg)      Physical Exam  Constitutional: He is oriented to person, place, and time.  Mesomorphic. Rambling in thought content. Chronically ill appearance.  HENT:  Head: Normocephalic and atraumatic.  Right Ear: External ear normal.  Left Ear: External ear normal.  Nose: Nose normal.  Mouth/Throat: Oropharynx is clear and moist.  Eyes: Conjunctivae and EOM are normal. Pupils are equal, round, and reactive to light.  Neck: No JVD present. No tracheal deviation present. No thyromegaly present.  Cardiovascular: Normal rate, regular rhythm and normal heart sounds.  Exam reveals no gallop and no friction rub.   No murmur heard. Pulmonary/Chest: No respiratory distress. He has no wheezes. He has no rales.  Abdominal: He exhibits no distension and no mass. There is no tenderness.  Musculoskeletal: He exhibits edema and tenderness.  Muscular weakness and pains. Unstable gait. Uses walker. Velcro brace on the right knee. Ganglion cyst left wrist dorsum. Exquisitely tender to palpation at the left elbow malleolus.  Lymphadenopathy:    He has no cervical adenopathy.  Neurological: He is  alert and oriented to person, place, and time. He displays abnormal reflex. No cranial nerve deficit. Coordination abnormal.  Skin: No rash noted. No erythema. There is pallor.  Psychiatric:  Rambling speech. Frustrated.  Vitals reviewed.   Labs reviewed: No flowsheet data found. Lab Results  Component Value Date   TSH 1.320 12/14/2015   TSH 3.890 12/14/2014   TSH 2.080 01/27/2014   Lab Results  Component Value Date   BUN 16 12/14/2015   BUN 17 12/14/2014   BUN 20 01/27/2014   Lab Results  Component Value Date   HGBA1C 5.8 (H) 12/14/2015   HGBA1C 5.8 (H) 12/14/2014   HGBA1C 6.0 (H) 01/27/2014    Assessment/Plan  1. Chronic pain syndrome Continue current medications  2. 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 Essential hypertension Still with elevated SBP, but it is better compared to last visit  4. Adhesive arachnoiditis Continue current pain medications. MRI of CS, TS, LS  5. Loss of weight Continue to monitor  6. Acute bronchitis, unspecified organism - Zpak -Hycet cough medication with chlorpheniramine

## 2016-04-18 NOTE — Addendum Note (Signed)
Addended by: WILLIAMS, DEBRA J on: 04/18/2016 03:21 PM   Modules accepted: Orders  

## 2016-04-19 ENCOUNTER — Telehealth: Payer: Self-pay | Admitting: *Deleted

## 2016-04-19 ENCOUNTER — Telehealth: Payer: Self-pay

## 2016-04-19 ENCOUNTER — Other Ambulatory Visit: Payer: Self-pay | Admitting: Internal Medicine

## 2016-04-19 NOTE — Telephone Encounter (Signed)
Message left on triage voicemail: patient would like a handicap placard, please advise

## 2016-04-19 NOTE — Telephone Encounter (Signed)
[facility] [phone] sent a fax stating that patient was expecting a prescription for an antibiotic and Tussinex from his visit yesterday. Please Advise.

## 2016-04-19 NOTE — Telephone Encounter (Signed)
Send Doxycycline 100 mg (#20) One twice daily for infection. Tussionex (4 Oz) 1 tsp q12h prn cough

## 2016-04-19 NOTE — Telephone Encounter (Signed)
Please leave one for me to complete when I am in the office Tuesday.

## 2016-04-20 ENCOUNTER — Other Ambulatory Visit: Payer: Self-pay

## 2016-04-20 MED ORDER — HYDROCOD POLST-CPM POLST ER 10-8 MG/5ML PO SUER: ORAL | 0 refills | Status: DC

## 2016-04-20 MED ORDER — DOXYCYCLINE HYCLATE 100 MG PO TABS: ORAL_TABLET | ORAL | 0 refills | Status: DC

## 2016-04-20 NOTE — Telephone Encounter (Signed)
Message received via [facility]- Medication is a  CII RX, can not be faxed  RX was reprinted for Dr.Carter to sign

## 2016-04-20 NOTE — Telephone Encounter (Signed)
Placard completed and placed in review and sign folder. Patient aware placard will be mailed to him on Tuesday 04-24-16

## 2016-04-20 NOTE — Telephone Encounter (Signed)
Doxycycline sent to pharmacy and Tussionex printed to [facility].

## 2016-04-23 ENCOUNTER — Other Ambulatory Visit: Payer: Self-pay | Admitting: Internal Medicine

## 2016-04-23 NOTE — Telephone Encounter (Signed)
Yes. He may have prescription for 90 days.

## 2016-04-26 ENCOUNTER — Telehealth: Payer: Self-pay

## 2016-04-26 ENCOUNTER — Other Ambulatory Visit: Payer: Self-pay | Admitting: Internal Medicine

## 2016-04-26 NOTE — Telephone Encounter (Signed)
He very likely has a viral bronchitis that may not respond to any antibiotic. Nevertheless, I am willing to switch to Augmentin 875 (20) one twice daily for infection.

## 2016-04-26 NOTE — Telephone Encounter (Signed)
Fax received [facility]. Patient is getting worse on doxycycline vs getting better  Please advise

## 2016-04-27 ENCOUNTER — Telehealth: Payer: Self-pay

## 2016-04-27 MED ORDER — AMOXICILLIN-POT CLAVULANATE 875-125 MG PO TABS: ORAL_TABLET | ORAL | 0 refills | Status: DC

## 2016-04-27 NOTE — Telephone Encounter (Signed)
Left message on voicemail for patient to return call when available   

## 2016-04-27 NOTE — Telephone Encounter (Signed)
[facility] to initiate PA for Lidocaine Ointment 5%.  ID# [id number]  PA phone number [phone]  Left message on voicemail for patient to return call when available. Reason for call- I need to know a specific diagnosis for use of ointment, the PA department will request that information.

## 2016-05-01 ENCOUNTER — Telehealth: Payer: Self-pay | Admitting: *Deleted

## 2016-05-01 ENCOUNTER — Other Ambulatory Visit: Payer: Self-pay | Admitting: Internal Medicine

## 2016-05-01 DIAGNOSIS — G894 Chronic pain syndrome: Secondary | ICD-10-CM

## 2016-05-01 DIAGNOSIS — J209 Acute bronchitis, unspecified: Secondary | ICD-10-CM

## 2016-05-01 MED ORDER — HYDROMORPHONE HCL 8 MG PO TABS: ORAL_TABLET | ORAL | 0 refills | Status: DC

## 2016-05-01 MED ORDER — DOXYCYCLINE HYCLATE 100 MG PO TABS: ORAL_TABLET | ORAL | 0 refills | Status: DC

## 2016-05-01 NOTE — Telephone Encounter (Signed)
The last prescription was only for 2 weeks to see if his pain control was better on this medication. I will write  A prescription for 30 days now.

## 2016-05-01 NOTE — Telephone Encounter (Signed)
Printed and placed for Dr. Chilton SiGreen to sign.

## 2016-05-01 NOTE — Telephone Encounter (Signed)
Patient called and stated that he needs a refill on his Hydromorphone 8mg . Was just filled on 04/18/16 but patient stated it was only for #115 and he takes it every 3 hours for pain. He stated he would like to pick it up tomorrow. Please Advise.

## 2016-05-02 ENCOUNTER — Telehealth: Payer: Self-pay | Admitting: *Deleted

## 2016-05-02 ENCOUNTER — Other Ambulatory Visit: Payer: Self-pay | Admitting: Internal Medicine

## 2016-05-02 DIAGNOSIS — G039 Meningitis, unspecified: Secondary | ICD-10-CM

## 2016-05-02 NOTE — Telephone Encounter (Signed)
I have entered the orders. It may work out best if he brings his own pillow.

## 2016-05-02 NOTE — Telephone Encounter (Signed)
Patient wants to know if you are going to order the 6 hours MRI with and without Contrast. And requesting pillows for the hard table. Please Advise.

## 2016-05-03 ENCOUNTER — Telehealth: Payer: Self-pay

## 2016-05-03 NOTE — Telephone Encounter (Signed)
Spoke with patient, patient is using Lidocaine ointment for foot pain. I called to initiate PA . Awaiting fax from insurance company.

## 2016-05-03 NOTE — Telephone Encounter (Signed)
Patient received a call from [facility], they are unable to perform scan due to stimulator in back (since 1981).  Patient states he had this scan performed before at [facility]. Patient would like for MRI of thoracic spine, lumbar and cervical set up at [facility].

## 2016-05-03 NOTE — Telephone Encounter (Signed)
Forwarded to Centralized Scheduling to [facility] at [facility]

## 2016-05-03 NOTE — Telephone Encounter (Signed)
Message left on triage voicemail: Tussinex cost 70 dollars, patient needs an alternative. Please advise

## 2016-05-04 ENCOUNTER — Other Ambulatory Visit: Payer: Self-pay | Admitting: Internal Medicine

## 2016-05-04 ENCOUNTER — Telehealth: Payer: Self-pay | Admitting: *Deleted

## 2016-05-04 MED ORDER — BENZONATATE 100 MG PO CAPS: ORAL_CAPSULE | ORAL | 0 refills | Status: DC

## 2016-05-04 NOTE — Telephone Encounter (Signed)
Spoke with patient, patient aware I have not heard form Dr.Green. Patient requested that I send message to another Doctor  Patient is using medication form Bronchitis

## 2016-05-04 NOTE — Telephone Encounter (Signed)
Received prior authorization form from [facility] [phone] Medicare for patient's Lidocaine Ointment. Filled out and placed for Dr. Chilton SiGreen to review and sign. To be faxed back to [facility] F: [phone]

## 2016-05-04 NOTE — Telephone Encounter (Signed)
Let's try tessalon perles 100mg  one capsule twice a day as needed for cough.  #60 no refills

## 2016-05-04 NOTE — Telephone Encounter (Signed)
Patient notified and agreed. Phoned in Rx to pharmacy.

## 2016-05-04 NOTE — Telephone Encounter (Signed)
I filled out a request from the pharmacy on Wed 05/02/16 for a cough medication containing hydrocodone.

## 2016-05-07 ENCOUNTER — Other Ambulatory Visit: Payer: Self-pay | Admitting: Internal Medicine

## 2016-05-07 DIAGNOSIS — G8929 Other chronic pain: Secondary | ICD-10-CM

## 2016-05-07 NOTE — Telephone Encounter (Signed)
It is fine to put this on his active medication. I do not recall exactly what done I signed the pharmacy request for this, but I think it is too soon for a refill.

## 2016-05-08 ENCOUNTER — Other Ambulatory Visit: Payer: Self-pay | Admitting: Internal Medicine

## 2016-05-08 DIAGNOSIS — R05 Cough: Secondary | ICD-10-CM

## 2016-05-08 DIAGNOSIS — R059 Cough, unspecified: Secondary | ICD-10-CM

## 2016-05-08 MED ORDER — HYDROCODONE-ACETAMINOPHEN 7.5-325 MG/15ML PO SOLN: ORAL | 0 refills | Status: DC

## 2016-05-11 ENCOUNTER — Other Ambulatory Visit: Payer: Self-pay | Admitting: Internal Medicine

## 2016-05-11 ENCOUNTER — Other Ambulatory Visit: Payer: Self-pay | Admitting: *Deleted

## 2016-05-11 MED ORDER — OXYCODONE HCL 10 MG PO TABS: ORAL_TABLET | ORAL | 0 refills | Status: DC

## 2016-05-11 MED ORDER — METHADONE HCL 10 MG PO TABS: ORAL_TABLET | ORAL | 0 refills | Status: DC

## 2016-05-11 MED ORDER — HYDROCODONE-ACETAMINOPHEN 7.5-325 MG PO TABS: ORAL_TABLET | ORAL | 0 refills | Status: DC

## 2016-05-11 NOTE — Telephone Encounter (Signed)
Patient requested and will pick up 

## 2016-05-14 ENCOUNTER — Other Ambulatory Visit: Payer: Self-pay | Admitting: Internal Medicine

## 2016-05-14 ENCOUNTER — Telehealth: Payer: Self-pay | Admitting: *Deleted

## 2016-05-14 ENCOUNTER — Ambulatory Visit (HOSPITAL_COMMUNITY)
Admission: RE | Admit: 2016-05-14 | Discharge: 2016-05-14 | Disposition: A | Discharge status: Home / Self Care | Payer: Medicare Other | Source: Ambulatory Visit | Attending: Internal Medicine | Admitting: Internal Medicine

## 2016-05-14 ENCOUNTER — Ambulatory Visit (HOSPITAL_COMMUNITY): Admission: RE | Admit: 2016-05-14 | Payer: Medicare Other | Source: Ambulatory Visit

## 2016-05-14 DIAGNOSIS — Z981 Arthrodesis status: Secondary | ICD-10-CM | POA: Diagnosis not present

## 2016-05-14 DIAGNOSIS — G039 Meningitis, unspecified: Secondary | ICD-10-CM | POA: Diagnosis not present

## 2016-05-14 DIAGNOSIS — M47816 Spondylosis without myelopathy or radiculopathy, lumbar region: Secondary | ICD-10-CM | POA: Insufficient documentation

## 2016-05-14 DIAGNOSIS — M47813 Spondylosis without myelopathy or radiculopathy, cervicothoracic region: Secondary | ICD-10-CM | POA: Insufficient documentation

## 2016-05-14 DIAGNOSIS — M546 Pain in thoracic spine: Secondary | ICD-10-CM | POA: Diagnosis not present

## 2016-05-14 DIAGNOSIS — M47812 Spondylosis without myelopathy or radiculopathy, cervical region: Secondary | ICD-10-CM | POA: Insufficient documentation

## 2016-05-14 DIAGNOSIS — M48061 Spinal stenosis, lumbar region without neurogenic claudication: Secondary | ICD-10-CM | POA: Diagnosis not present

## 2016-05-14 DIAGNOSIS — M5126 Other intervertebral disc displacement, lumbar region: Secondary | ICD-10-CM | POA: Diagnosis not present

## 2016-05-14 LAB — CREATININE, SERUM
CREATININE: 1.18 mg/dL (ref 0.61–1.24)
GFR calc Af Amer: >60 mL/min (ref >60)

## 2016-05-14 MED ORDER — GADOBENATE DIMEGLUMINE 529 MG/ML IV SOLN
18.0000 mL | Freq: Once | INTRAVENOUS | Status: AC | PRN
  Administered 2016-05-14: 18 mL via INTRAVENOUS

## 2016-05-14 NOTE — Telephone Encounter (Signed)
MRI of the CS, TS, and LS was done in 2004. Please see reports. I have requested these studies to be repeated , 13 years after the last studies, due to increased pain. He needs reevaluation of these areas. Has there been a change in the rules that now makes him ineligible to have these studies done?

## 2016-05-14 NOTE — Telephone Encounter (Signed)
Brooke with [facility] called and stated that patient has an appointment there for Cervical and Lumbar MRI. He was turned away by [facility] due to having Electrodes. She stated that Jasmine DecemberSharon with our office told patient he could still have the test done at [facility] and [place] stated that this is not so. They cannot perform the test with Electrodes. She stated that the Tech is going to call Dr. Chilton SiGreen.

## 2016-05-14 NOTE — Telephone Encounter (Signed)
Yes they cannot perform due to patient having Electrodes. They stated a Tech from [facility] is going to call you regarding this.

## 2016-05-14 NOTE — Telephone Encounter (Signed)
No one has called so far. Whatever is in his back was there in 2004. He has not had any additional surgeery.

## 2016-05-15 ENCOUNTER — Other Ambulatory Visit: Payer: Self-pay | Admitting: Internal Medicine

## 2016-05-17 ENCOUNTER — Telehealth: Payer: Self-pay

## 2016-05-17 NOTE — Telephone Encounter (Signed)
This was done by me.

## 2016-05-17 NOTE — Telephone Encounter (Signed)
Patient called requesting Dr.Green or Kaylyn LayerDebbie Williams (CMA) call him with his MRI results.    Please advise

## 2016-05-18 ENCOUNTER — Telehealth: Payer: Self-pay | Admitting: Internal Medicine

## 2016-05-18 NOTE — Telephone Encounter (Signed)
Per Dr.Green I mailed a copy of MRI's to patient yesterday

## 2016-05-18 NOTE — Telephone Encounter (Signed)
left msg asking pt to confirm if he can make this AWV appt w/ nurse. VDM (DD) °

## 2016-05-19 ENCOUNTER — Other Ambulatory Visit: Payer: Self-pay | Admitting: Internal Medicine

## 2016-05-21 ENCOUNTER — Other Ambulatory Visit: Payer: Self-pay | Admitting: Internal Medicine

## 2016-05-21 ENCOUNTER — Other Ambulatory Visit: Payer: Self-pay | Admitting: *Deleted

## 2016-05-21 ENCOUNTER — Telehealth: Payer: Self-pay | Admitting: *Deleted

## 2016-05-21 DIAGNOSIS — I739 Peripheral vascular disease, unspecified: Secondary | ICD-10-CM

## 2016-05-21 DIAGNOSIS — M25562 Pain in left knee: Secondary | ICD-10-CM

## 2016-05-21 DIAGNOSIS — M25561 Pain in right knee: Secondary | ICD-10-CM

## 2016-05-21 DIAGNOSIS — R262 Difficulty in walking, not elsewhere classified: Secondary | ICD-10-CM

## 2016-05-21 MED ORDER — TESTOSTERONE 20.25 MG/ACT (1.62%) TD GEL: TRANSDERMAL | 0 refills | Status: DC

## 2016-05-21 NOTE — Telephone Encounter (Signed)
Schedule MRI of the pelvis , hips, and femurs as requested.

## 2016-05-21 NOTE — Telephone Encounter (Signed)
[facility]  

## 2016-05-21 NOTE — Telephone Encounter (Signed)
Patient called and stated that he is having trouble with his legs and hips, stated that he is having a hard time walking. He is requesting a MRI on his legs and hips to be scheduled at [facility]. He stated he does not [facility]. Please Advise.

## 2016-05-22 ENCOUNTER — Other Ambulatory Visit: Payer: Self-pay | Admitting: Internal Medicine

## 2016-05-22 NOTE — Telephone Encounter (Signed)
Orders placed.

## 2016-05-31 ENCOUNTER — Telehealth: Payer: Self-pay

## 2016-05-31 ENCOUNTER — Telehealth: Payer: Self-pay | Admitting: *Deleted

## 2016-05-31 ENCOUNTER — Other Ambulatory Visit: Payer: Self-pay | Admitting: *Deleted

## 2016-05-31 DIAGNOSIS — G894 Chronic pain syndrome: Secondary | ICD-10-CM

## 2016-05-31 MED ORDER — HYDROMORPHONE HCL 8 MG PO TABS: ORAL_TABLET | ORAL | 0 refills | Status: DC

## 2016-05-31 NOTE — Telephone Encounter (Signed)
Patient requested and neighbor will pick up

## 2016-05-31 NOTE — Telephone Encounter (Signed)
Peggy with [facility] called and stated that the Imaging orders placed for patient needs to be signed by Dr. Chilton SiGreen. Instructed her to fax the orders to us and we will have Dr. Chilton SiGreen sign and will fax back.

## 2016-05-31 NOTE — Telephone Encounter (Signed)
Left message for patient stating that he had a prescription ready to pick up at the office. Prescription is for hydromorphone (Dilaudid) 8 mg. Take one tablet every 3 hours to control pain. #240 no RF

## 2016-06-02 ENCOUNTER — Ambulatory Visit (HOSPITAL_COMMUNITY): Admission: RE | Admit: 2016-06-02 | Payer: Medicare Other | Source: Ambulatory Visit

## 2016-06-02 ENCOUNTER — Ambulatory Visit (HOSPITAL_COMMUNITY)
Admission: RE | Admit: 2016-06-02 | Discharge: 2016-06-02 | Disposition: A | Discharge status: Home / Self Care | Payer: Medicare Other | Source: Ambulatory Visit | Attending: Internal Medicine | Admitting: Internal Medicine

## 2016-06-02 ENCOUNTER — Ambulatory Visit (HOSPITAL_COMMUNITY): Payer: Medicare Other

## 2016-06-02 DIAGNOSIS — I739 Peripheral vascular disease, unspecified: Secondary | ICD-10-CM | POA: Insufficient documentation

## 2016-06-02 DIAGNOSIS — M79652 Pain in left thigh: Secondary | ICD-10-CM | POA: Diagnosis not present

## 2016-06-02 DIAGNOSIS — M79651 Pain in right thigh: Secondary | ICD-10-CM | POA: Diagnosis not present

## 2016-06-02 DIAGNOSIS — M25561 Pain in right knee: Secondary | ICD-10-CM | POA: Diagnosis not present

## 2016-06-02 DIAGNOSIS — R262 Difficulty in walking, not elsewhere classified: Secondary | ICD-10-CM | POA: Diagnosis not present

## 2016-06-02 DIAGNOSIS — M25562 Pain in left knee: Secondary | ICD-10-CM | POA: Insufficient documentation

## 2016-06-02 DIAGNOSIS — M25551 Pain in right hip: Secondary | ICD-10-CM | POA: Diagnosis not present

## 2016-06-02 DIAGNOSIS — M25552 Pain in left hip: Secondary | ICD-10-CM | POA: Diagnosis not present

## 2016-06-04 NOTE — Telephone Encounter (Signed)
R/S and left msg asking pt to confirm this new AWV appt w/ nurse. VDM (DD)

## 2016-06-06 ENCOUNTER — Other Ambulatory Visit: Payer: Self-pay | Admitting: Internal Medicine

## 2016-06-06 ENCOUNTER — Other Ambulatory Visit: Payer: Self-pay

## 2016-06-06 ENCOUNTER — Telehealth: Payer: Self-pay

## 2016-06-06 DIAGNOSIS — G8929 Other chronic pain: Secondary | ICD-10-CM

## 2016-06-06 DIAGNOSIS — J209 Acute bronchitis, unspecified: Secondary | ICD-10-CM

## 2016-06-06 MED ORDER — HYDROCODONE-ACETAMINOPHEN 7.5-325 MG/15ML PO SOLN: ORAL | 0 refills | Status: DC

## 2016-06-06 MED ORDER — HYDROCODONE-ACETAMINOPHEN 7.5-325 MG PO TABS: ORAL_TABLET | ORAL | 0 refills | Status: DC

## 2016-06-06 MED ORDER — OXYCODONE HCL 10 MG PO TABS: ORAL_TABLET | ORAL | 0 refills | Status: DC

## 2016-06-06 MED ORDER — METHADONE HCL 10 MG PO TABS: ORAL_TABLET | ORAL | 0 refills | Status: DC

## 2016-06-06 NOTE — Telephone Encounter (Signed)
Patient called requesting refills on Methadone, Hydrocodone, and Oxycodone. Refills printed and placed on ledge for signature.  Patient also stated he still has bronchitis and would like a cough medication. Patient states he has a productive cough with discolored phlegm.  Please advise

## 2016-06-06 NOTE — Telephone Encounter (Signed)
RX printed and given to patient [facility]CM   Pharmacy called and spoke with Dr.Green regarding cough medication rx

## 2016-06-08 ENCOUNTER — Other Ambulatory Visit: Payer: Self-pay | Admitting: Internal Medicine

## 2016-06-08 DIAGNOSIS — G8929 Other chronic pain: Secondary | ICD-10-CM

## 2016-06-11 ENCOUNTER — Other Ambulatory Visit: Payer: Self-pay

## 2016-06-11 ENCOUNTER — Other Ambulatory Visit: Payer: Self-pay | Admitting: Internal Medicine

## 2016-06-11 MED ORDER — DIAZEPAM 10 MG PO TABS: ORAL_TABLET | ORAL | 0 refills | Status: DC

## 2016-06-11 MED ORDER — TRAMADOL HCL 50 MG PO TABS: ORAL_TABLET | ORAL | 0 refills | Status: DC

## 2016-06-11 NOTE — Addendum Note (Signed)
Addended by: Maurice SmallBEATTY, Shadman Tozzi C on: 06/11/2016 04:31 PM   Modules accepted: Orders

## 2016-06-12 ENCOUNTER — Telehealth: Payer: Self-pay | Admitting: *Deleted

## 2016-06-12 ENCOUNTER — Other Ambulatory Visit: Payer: Self-pay | Admitting: Internal Medicine

## 2016-06-12 DIAGNOSIS — J209 Acute bronchitis, unspecified: Secondary | ICD-10-CM

## 2016-06-12 NOTE — Telephone Encounter (Signed)
Ok for 8 oz. Future RF need to be addressed by Dr Chilton SiGreen

## 2016-06-12 NOTE — Telephone Encounter (Signed)
Patient called and stated that he needs a refill on his cough medication Hycet. He stated that he was out because Dr. Chilton SiGreen only gave him 1oz when he usually gets 16oz. It was last filled 06/06/16. Please Advise.

## 2016-06-13 ENCOUNTER — Other Ambulatory Visit: Payer: Self-pay | Admitting: Internal Medicine

## 2016-06-13 MED ORDER — HYDROCODONE-ACETAMINOPHEN 7.5-325 MG/15ML PO SOLN: ORAL | 0 refills | Status: DC

## 2016-06-13 NOTE — Telephone Encounter (Signed)
Printed Rx for Dr. Montez Moritaarter to sign. Patient notified to pick up

## 2016-06-19 ENCOUNTER — Telehealth: Payer: Self-pay | Admitting: Internal Medicine

## 2016-06-19 NOTE — Telephone Encounter (Signed)
left another msg asking pt to confirm this AWV appt w/ nurse. VDM (DD) °

## 2016-06-20 ENCOUNTER — Other Ambulatory Visit: Payer: Self-pay | Admitting: Internal Medicine

## 2016-06-20 DIAGNOSIS — G8929 Other chronic pain: Secondary | ICD-10-CM

## 2016-06-20 NOTE — Telephone Encounter (Signed)
Patient called back and asked that I cancel wellness visit. Patient states it is hard enough for him to come in a see Dr.Greens and he is not interested in a wellness visit at this time.  Appointment cancelled

## 2016-06-22 ENCOUNTER — Other Ambulatory Visit: Payer: Self-pay | Admitting: Internal Medicine

## 2016-06-26 ENCOUNTER — Ambulatory Visit: Payer: Medicare Other

## 2016-06-28 ENCOUNTER — Other Ambulatory Visit: Payer: Self-pay | Admitting: *Deleted

## 2016-06-28 ENCOUNTER — Other Ambulatory Visit: Payer: Self-pay

## 2016-06-28 ENCOUNTER — Telehealth: Payer: Self-pay

## 2016-06-28 DIAGNOSIS — Z5181 Encounter for therapeutic drug level monitoring: Secondary | ICD-10-CM

## 2016-06-28 DIAGNOSIS — G894 Chronic pain syndrome: Secondary | ICD-10-CM

## 2016-06-28 MED ORDER — HYDROMORPHONE HCL 8 MG PO TABS: ORAL_TABLET | ORAL | 0 refills | Status: DC

## 2016-06-28 NOTE — Telephone Encounter (Signed)
Patient requested and will have neighbor pick up

## 2016-06-28 NOTE — Telephone Encounter (Signed)
I spoke with patient to notify him that his refill medications need to be picked up in person by him today at provider's request. Dr. Renato Gailseed has requested that patient have a drug test. Patient was only notified that he needed to pick up his prescription in person. Patient stated that he did not understand why he could not have someone pick up the medications and I told him that the provider requested an in-person pickup. Patient stated he would have someone to bring him in later today.

## 2016-07-03 ENCOUNTER — Other Ambulatory Visit: Payer: Self-pay | Admitting: Internal Medicine

## 2016-07-03 ENCOUNTER — Ambulatory Visit: Payer: Medicare Other

## 2016-07-03 LAB — DRUG TOX MONITOR 1 W/CONF, ORAL FLD
Alprazolam: NEGATIVE ng/mL (ref <0.50)
Amphetamines: NEGATIVE ng/mL (ref <10)
Barbiturates: NEGATIVE ng/mL (ref <10)
Benzodiazepines: POSITIVE ng/mL — AB (ref <0.50)
Buprenorphine: NEGATIVE ng/mL (ref <0.025)
Carisoprodol: NEGATIVE ng/mL (ref <2.5)
Chlordiazepoxide: NEGATIVE ng/mL (ref <0.50)
Clonazepam: NEGATIVE ng/mL (ref <0.50)
Cocaine: NEGATIVE ng/mL (ref <2.5)
Codeine: 14.8 ng/mL — ABNORMAL HIGH (ref <2.5)
Cotinine: 29.1 ng/mL — ABNORMAL HIGH (ref <5.0)
Diazepam: 4.03 ng/mL — ABNORMAL HIGH (ref <0.50)
Dihydrocodeine: 2.7 ng/mL — ABNORMAL HIGH (ref <2.5)
EDDP: 10.6 ng/mL — ABNORMAL HIGH (ref <5.0)
Fentanyl: NEGATIVE ng/mL (ref <0.10)
Flunitrazepam: NEGATIVE ng/mL (ref <0.50)
Flurazepam: NEGATIVE ng/mL (ref <0.50)
Heroin Metabolite: NEGATIVE ng/mL (ref <1.0)
Hydrocodone: 16 ng/mL — ABNORMAL HIGH (ref <2.5)
Hydromorphone: 7.1 ng/mL — ABNORMAL HIGH (ref <2.5)
Lorazepam: NEGATIVE ng/mL (ref <0.50)
MDMA: NEGATIVE ng/mL (ref <10)
Marijuana: NEGATIVE ng/mL (ref <2.5)
Meperidine: NEGATIVE ng/mL (ref <5.0)
Meprobamate: 23.8 ng/mL — ABNORMAL HIGH (ref <2.5)
Meprobamate: POSITIVE ng/mL — AB (ref <2.5)
Methadone: 297.6 ng/mL — ABNORMAL HIGH (ref <5.0)
Methadone: POSITIVE ng/mL — AB (ref <5.0)
Midazolam: NEGATIVE ng/mL (ref <0.50)
Morphine: NEGATIVE ng/mL (ref <2.5)
Nicotine Metabolite: POSITIVE ng/mL — AB (ref <5.0)
Nordiazepam: 17.72 ng/mL — ABNORMAL HIGH (ref <0.50)
Norhydrocodone: 5.4 ng/mL — ABNORMAL HIGH (ref <2.5)
Noroxycodone: 126.1 ng/mL — ABNORMAL HIGH (ref <2.5)
Opiates: POSITIVE ng/mL — AB (ref <2.5)
Oxazepam: 1.55 ng/mL — ABNORMAL HIGH (ref <0.50)
Oxycodone: 89.1 ng/mL — ABNORMAL HIGH (ref <2.5)
Oxymorphone: 2.5 ng/mL — ABNORMAL HIGH (ref <2.5)
Phencyclidine: NEGATIVE ng/mL (ref <10)
Propoxyphene: NEGATIVE ng/mL (ref <5.0)
Tapentadol: NEGATIVE ng/mL (ref <5.0)
Temazepam: 2.88 ng/mL — ABNORMAL HIGH (ref <0.50)
Tramadol: >500 ng/mL — ABNORMAL HIGH (ref <5.0)
Tramadol: POSITIVE ng/mL — AB (ref <5.0)
Triazolam: NEGATIVE ng/mL (ref <0.50)
Zolpidem: NEGATIVE ng/mL (ref <5.0)

## 2016-07-04 ENCOUNTER — Other Ambulatory Visit: Payer: Self-pay | Admitting: Nurse Practitioner

## 2016-07-05 ENCOUNTER — Other Ambulatory Visit: Payer: Self-pay | Admitting: Internal Medicine

## 2016-07-05 DIAGNOSIS — J209 Acute bronchitis, unspecified: Secondary | ICD-10-CM

## 2016-07-06 ENCOUNTER — Other Ambulatory Visit: Payer: Self-pay | Admitting: Internal Medicine

## 2016-07-06 ENCOUNTER — Other Ambulatory Visit: Payer: Self-pay | Admitting: *Deleted

## 2016-07-06 ENCOUNTER — Telehealth: Payer: Self-pay

## 2016-07-06 DIAGNOSIS — R05 Cough: Secondary | ICD-10-CM | POA: Insufficient documentation

## 2016-07-06 DIAGNOSIS — R059 Cough, unspecified: Secondary | ICD-10-CM | POA: Insufficient documentation

## 2016-07-06 MED ORDER — HYDROCODONE-ACETAMINOPHEN 7.5-325 MG PO TABS: ORAL_TABLET | ORAL | 0 refills | Status: DC

## 2016-07-06 MED ORDER — METHADONE HCL 10 MG PO TABS
ORAL_TABLET | ORAL | 0 refills | Status: DC
Start: 2016-07-06 — End: 2016-08-03

## 2016-07-06 MED ORDER — OXYCODONE HCL 10 MG PO TABS: ORAL_TABLET | ORAL | 0 refills | Status: DC

## 2016-07-06 NOTE — Telephone Encounter (Signed)
Called to let patient know that Dr. Chilton SiGreen put an order to get chest x-ray today, or first of next week, at [facility] either one. Left message on voice mail.

## 2016-07-06 NOTE — Telephone Encounter (Signed)
Patient requested and will pick up 

## 2016-07-10 ENCOUNTER — Other Ambulatory Visit: Payer: Self-pay | Admitting: Nurse Practitioner

## 2016-07-13 ENCOUNTER — Telehealth: Payer: Self-pay

## 2016-07-13 NOTE — Telephone Encounter (Signed)
Called patient regarding the letter he had mailed Dr. Chilton SiGreen about his MRI's. Per Dr. Thomasene LotGreen's note:" MRI covered right and left hips , pelvis and right and left femur. I do not have a reason to do MRI of lower legs. Comments on the MR Thoracic spine acknowledge the presence of Neurostimulator wires. The radiologist that read the MRI tests are based in [place], not [place] Dr. Chilton SiGreen has ordered chest x-ray, at [facility]. Gave both addresses to patient and phone number. Walk in hours 8:30 to 4:30. He will try to go today.  Patient feels that because his legs give out that this would be reason enough to order MRI of lower legs. Explain to patient that is one of the reasons why Dr. Chilton SiGreen ordered the above MRI's.

## 2016-07-17 ENCOUNTER — Ambulatory Visit
Admission: RE | Admit: 2016-07-17 | Discharge: 2016-07-17 | Disposition: A | Discharge status: Home / Self Care | Payer: Medicare Other | Source: Ambulatory Visit | Attending: Internal Medicine | Admitting: Internal Medicine

## 2016-07-17 ENCOUNTER — Telehealth: Payer: Self-pay

## 2016-07-17 ENCOUNTER — Telehealth: Payer: Self-pay | Admitting: *Deleted

## 2016-07-17 DIAGNOSIS — R05 Cough: Secondary | ICD-10-CM | POA: Diagnosis not present

## 2016-07-17 DIAGNOSIS — R059 Cough, unspecified: Secondary | ICD-10-CM

## 2016-07-17 NOTE — Telephone Encounter (Signed)
Patient called and stated that he is going in for a Chest X-Ray today and wants to have both hips down to his knees x-rayed also due to pain. Informed patient that he would need to be seen before we could add a x-ray due to diagnosing. Offered an appointment but patient refused.

## 2016-07-17 NOTE — Telephone Encounter (Signed)
-----   Message from Sharon SellerJessica K Eubanks, NP sent at 07/17/2016 11:45 AM EST ----- Regarding: cough May use Mucinex DM by mouth twice daily for cough and congestion, encourage proper hydration

## 2016-07-17 NOTE — Telephone Encounter (Signed)
Spoke with patient, told him we can not refill the Hydrocodone cough medication. Told to use the Mucinex DM. He said it won't help.

## 2016-07-17 NOTE — Telephone Encounter (Signed)
Patient wants a refill on his cough medication Hydrocodone (Hycet). Just got it refill on 07/06/16. Per Dr. Chilton SiGreen he needs to measure it out. He got my message about chest x-ray showing he has pneumonia, he needs a refill. When he coughs it kills his back. I told him I would ask.

## 2016-07-17 NOTE — Telephone Encounter (Signed)
No additional refills.

## 2016-07-24 NOTE — Telephone Encounter (Signed)
Patient called and stated that he cannot get a refill on his cough medication. Was told to use Mucinex instead. Patient wants you to prescribe his cough medication, stated that the cough is hurting his back. Please Advise.

## 2016-07-25 ENCOUNTER — Other Ambulatory Visit: Payer: Self-pay | Admitting: Internal Medicine

## 2016-07-25 DIAGNOSIS — R05 Cough: Secondary | ICD-10-CM

## 2016-07-25 DIAGNOSIS — R059 Cough, unspecified: Secondary | ICD-10-CM

## 2016-07-25 DIAGNOSIS — J209 Acute bronchitis, unspecified: Secondary | ICD-10-CM

## 2016-07-25 MED ORDER — HYDROCODONE-ACETAMINOPHEN 7.5-325 MG/15ML PO SOLN: ORAL | 0 refills | Status: DC

## 2016-07-25 NOTE — Progress Notes (Signed)
Approved refill of cough medication

## 2016-07-25 NOTE — Telephone Encounter (Signed)
Approved refill. Must follow directions. No further early refills will be authorized.

## 2016-07-26 ENCOUNTER — Other Ambulatory Visit: Payer: Self-pay | Admitting: *Deleted

## 2016-07-26 ENCOUNTER — Telehealth: Payer: Self-pay

## 2016-07-26 DIAGNOSIS — G894 Chronic pain syndrome: Secondary | ICD-10-CM

## 2016-07-26 MED ORDER — HYDROMORPHONE HCL 8 MG PO TABS: ORAL_TABLET | ORAL | 0 refills | Status: DC

## 2016-07-26 NOTE — Telephone Encounter (Signed)
I called patient to let him know that he has 2 prescriptions to pick up at the office. The prescriptions are hydromorphone 8 mg, # 240 and hydrocodone/apap solution 7.5-325/15 ml, #236 ml.  Prescriptions were placed in filing cabinet at front desk.

## 2016-07-26 NOTE — Telephone Encounter (Signed)
Patient requested and will pick up 

## 2016-07-26 NOTE — Telephone Encounter (Signed)
Patient notified and will pick up 

## 2016-07-31 ENCOUNTER — Other Ambulatory Visit: Payer: Self-pay | Admitting: Internal Medicine

## 2016-07-31 ENCOUNTER — Telehealth: Payer: Self-pay | Admitting: *Deleted

## 2016-07-31 NOTE — Telephone Encounter (Signed)
error 

## 2016-08-01 ENCOUNTER — Ambulatory Visit: Payer: Self-pay | Admitting: Internal Medicine

## 2016-08-03 ENCOUNTER — Other Ambulatory Visit: Payer: Self-pay | Admitting: *Deleted

## 2016-08-03 MED ORDER — METHADONE HCL 10 MG PO TABS: ORAL_TABLET | ORAL | 0 refills | Status: DC

## 2016-08-03 MED ORDER — TEMAZEPAM 30 MG PO CAPS: ORAL_CAPSULE | ORAL | 0 refills | Status: DC

## 2016-08-03 MED ORDER — OXYCODONE HCL 10 MG PO TABS: ORAL_TABLET | ORAL | 0 refills | Status: DC

## 2016-08-03 MED ORDER — HYDROCODONE-ACETAMINOPHEN 7.5-325 MG PO TABS: ORAL_TABLET | ORAL | 0 refills | Status: DC

## 2016-08-03 NOTE — Telephone Encounter (Signed)
Patient requested and will pick up 

## 2016-08-04 ENCOUNTER — Emergency Department (HOSPITAL_COMMUNITY)
Admission: EM | Admit: 2016-08-04 | Discharge: 2016-08-04 | Disposition: A | Discharge status: Home / Self Care | Payer: Medicare Other | Attending: Emergency Medicine | Admitting: Emergency Medicine

## 2016-08-04 ENCOUNTER — Emergency Department (HOSPITAL_COMMUNITY): Payer: Medicare Other

## 2016-08-04 ENCOUNTER — Encounter (HOSPITAL_COMMUNITY): Payer: Self-pay

## 2016-08-04 DIAGNOSIS — E119 Type 2 diabetes mellitus without complications: Secondary | ICD-10-CM | POA: Insufficient documentation

## 2016-08-04 DIAGNOSIS — Y939 Activity, unspecified: Secondary | ICD-10-CM | POA: Insufficient documentation

## 2016-08-04 DIAGNOSIS — I10 Essential (primary) hypertension: Secondary | ICD-10-CM | POA: Insufficient documentation

## 2016-08-04 DIAGNOSIS — E039 Hypothyroidism, unspecified: Secondary | ICD-10-CM | POA: Insufficient documentation

## 2016-08-04 DIAGNOSIS — Y999 Unspecified external cause status: Secondary | ICD-10-CM | POA: Insufficient documentation

## 2016-08-04 DIAGNOSIS — Y929 Unspecified place or not applicable: Secondary | ICD-10-CM | POA: Insufficient documentation

## 2016-08-04 DIAGNOSIS — W000XXA Fall on same level due to ice and snow, initial encounter: Secondary | ICD-10-CM | POA: Insufficient documentation

## 2016-08-04 DIAGNOSIS — M25531 Pain in right wrist: Secondary | ICD-10-CM

## 2016-08-04 DIAGNOSIS — Z79899 Other long term (current) drug therapy: Secondary | ICD-10-CM | POA: Diagnosis not present

## 2016-08-04 DIAGNOSIS — Z87891 Personal history of nicotine dependence: Secondary | ICD-10-CM | POA: Diagnosis not present

## 2016-08-04 DIAGNOSIS — M25532 Pain in left wrist: Secondary | ICD-10-CM | POA: Diagnosis not present

## 2016-08-04 DIAGNOSIS — S6992XA Unspecified injury of left wrist, hand and finger(s), initial encounter: Secondary | ICD-10-CM | POA: Diagnosis not present

## 2016-08-04 DIAGNOSIS — W19XXXA Unspecified fall, initial encounter: Secondary | ICD-10-CM

## 2016-08-04 NOTE — Discharge Instructions (Signed)
Your x-rays are normal. We have placed you in a thumb spica splint. It is important to have repeat x-rays of your wrist in the next 1-2 weeks to assess for occult fracture. Continue taking her normal pain medications. Follow-up with the hand surgeon. Return to emergency department for any new or concerning symptoms.

## 2016-08-04 NOTE — ED Triage Notes (Signed)
Patient reports that he fell on the ice last night and now has left wrist pain that radiates in to the left elbow.

## 2016-08-04 NOTE — ED Provider Notes (Signed)
[facility]-EMERGENCY DEPT Provider Note    By signing my name below, I, Earmon Phoenix, attest that this documentation has been prepared under the direction and in the presence of Cheri Fowler, PA-C. Electronically Signed: Earmon Phoenix, ED Scribe. 08/04/16. 12:26 PM.    History   Chief Complaint Chief Complaint  Patient presents with  . Hand Injury   HPI  Austin Barnes is a 66 y.o. male who presents to the Emergency Department complaining of a fall that occurred last night. He reports associated generalized diffuse soreness and moderate left wrist pain. He states he was about to get into his car and fell backwards, hitting his wrist on the ground. He has taken Methadone for pain and wrapped the wrist with an ace bandage with minimal relief. Moving the left wrist or palpating it increase the pain. Pt denies alleviating factors. He denies LOC, bruising, wounds, nausea, vomiting, fever, chills. Pt states he just finished a course of antibiotics for pneumonia and was supposed to follow up with his PCP three days ago but could not due to the weather.   Past Medical History:  Diagnosis Date  . 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 Anemia   . Arthritis   . Chronic pain   . 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 DVT (deep venous thrombosis) (HCC)   . Heart murmur   . Hiatal hernia   . Hypothyroidism 12/14/2014  . Impotence   . Other and unspecified hyperlipidemia   . 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 Peripheral vascular disease (HCC)   . Reflux   . Testosterone insufficiency 2013  . Ulcer (HCC)    Peptic disease  . Unspecified essential hypertension     Patient Active Problem List   Diagnosis Date Noted  . Cough 07/06/2016  . Acute bronchitis 04/18/2016  . Loss of weight 12/14/2015  . Weak 12/14/2015  . Myalgia and myositis 12/14/2015  . Frequency of urination 12/14/2015  . Hypothyroidism 12/14/2014  . Adhesive arachnoiditis  12/14/2014  . 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 Other malaise and fatigue 01/27/2014  . Hip pain 01/27/2014  . Pain in left elbow 01/27/2014  . Ganglion cyst 06/23/2013  . Chronic pain 04/08/2013  . 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 Arthritis   . Essential hypertension   . Hiatal hernia   . Peripheral vascular disease (HCC)   . Reflux   . Anemia   . Testosterone insufficiency   . Hyperlipidemia     Past Surgical History:  Procedure Laterality Date  . KIDNEY STONE SURGERY    . SPINE SURGERY         Home Medications    Prior to Admission medications   Medication Sig Start Date End Date Taking? Authorizing Provider  ANDROGEL PUMP 20.25 MG/ACT (1.62%) GEL APPLY 2 PUMP ACTUATIONS DAILY FOR TESTOSTERONE REPLACEMENT. 06/21/16   Tiffany L Reed, DO  baclofen (LIORESAL) 10 MG tablet TAKE 1 TABLET THREE TIMES DAILY AS NEEDED FOR MUSCLE RELAXATION. 07/26/16   Kimber Relic, MD  benzonatate (TESSALON PERLES) 100 MG capsule Take one capsule by mouth twice daily as needed for cough 05/04/16   Tiffany L Reed, DO  DENTA 5000 PLUS 1.1 % CREA dental cream BRUSH ON TEETH FOR 2 MINUTES THEN SWISH AND EXPECTORATE. 07/31/16   Kimber Relic, MD  diazepam (VALIUM) 10 MG tablet TAKE 1 TABLET UP TO FOUR TIMES DAILY TO RELAX MUSCLES. 06/11/16   Lenon Curt  Green, MD  diazepam (VALIUM) 10 MG tablet TAKE 1 TABLET UP TO FOUR TIMES DAILY TO RELAX MUSCLES. 06/11/16   Sharon Seller, NP  doxycycline (VIBRA-TABS) 100 MG tablet One twice daily for infection 05/01/16   Kimber Relic, MD  HYDROcodone-acetaminophen (HYCET) 7.5-325 mg/15 ml solution TAKE 1/2 TEASPOONFUL (2.5ML) UP TO 4 TIMES A DAY IN 24 HOURS FOR COUGH. 07/25/16   Kimber Relic, MD  HYDROcodone-acetaminophen [facility]) 7.5-325 MG tablet Take one tablet every 6 hour if needed for pain 08/03/16   Tiffany L Reed, DO  HYDROmorphone (DILAUDID) 8 MG tablet Take one tablet every 3 hours to control pain  07/26/16   Tiffany L Reed, DO  levothyroxine (SYNTHROID, LEVOTHROID) 50 MCG tablet TAKE 1 TABLET EACH DAY FOR THYROID. 02/29/16   Kimber Relic, MD  lidocaine (XYLOCAINE) 5 % ointment APPLY TWICE A DAY AS NEEDED FOR PAIN. 04/19/16   Kimber Relic, MD  losartan (COZAAR) 50 MG tablet One daily to control BP 12/14/14   Kimber Relic, MD  methadone (DOLOPHINE) 10 MG tablet Take four tablets every 6 hours to control pain 08/03/16   Tiffany L Reed, DO  minoxidil (LONITEN) 10 MG tablet TAKE 1 TABLET DAILY TO CONTROL BLOOD PRESSURE. 02/29/16   Kimber Relic, MD  NITRO-BID 2 % ointment APPLY 1 INCH STRIP TO FEET TWICE DAILY. 07/03/16   Kimber Relic, MD  omeprazole (PRILOSEC) 20 MG capsule TAKE (2) CAPSULES TWICE DAILY. 05/14/16   Kimber Relic, MD  Oxycodone HCl 10 MG TABS Take four tablets by mouth every 6 hours as needed for pain 08/03/16   Tiffany L Reed, DO  prochlorperazine (COMPAZINE) 10 MG tablet TAKE 1 TABLET EVERY 8 HOURS AS NEEDED FOR NAUSEA. 06/22/16   Kimber Relic, MD  temazepam (RESTORIL) 30 MG capsule TAKE 1 CAPSULE ONCE DAILY AT BEDTIME AS NEEDED FOR REST. 08/03/16   Tiffany L Reed, DO  traMADol (ULTRAM) 50 MG tablet TAKE 1 TABLET UP TO FOUR TIMES DAILY AS NEEDED FOR PAIN. 06/11/16   Sharon Seller, NP  VOLTAREN 1 % GEL APPLY 2-4GM TO AFFECTED AREA UP TO 4 TIMES A DAY 06/21/16   Kimber Relic, MD    Family History Family History  Problem Relation Age of Onset  . Hypertension Mother   . Stroke Father 46    Social History Social History  Substance Use Topics  . Smoking status: Former Smoker    Packs/day: 0.25    Years: 3.00    Types: Cigarettes  . Smokeless tobacco: Former Neurosurgeon    Quit date: 07/17/2007  . Alcohol use No     Allergies   Codeine; Demerol; Disalcid [salsalate]; Feldene [piroxicam]; Penicillins; and Sulfa antibiotics   Review of Systems Review of Systems A complete 10 system review of systems was obtained and all systems are negative except as noted in the  HPI and PMH.    Physical Exam Updated Vital Signs BP 165/84 (BP Location: Right Arm)   Pulse 99   Temp 97.6 F (36.4 C)   Resp 16   Ht 5\' 8"  (1.727 m)   Wt 195 lb (88.5 kg)   SpO2 94%   BMI 29.65 kg/m   Physical Exam  Constitutional: He is oriented to person, place, and time. He appears well-developed and well-nourished.  Non-toxic appearance. He does not have a sickly appearance. He does not appear ill.  HENT:  Head: Normocephalic and atraumatic.  Mouth/Throat: Oropharynx is clear and moist.  Eyes: Conjunctivae are normal. Pupils are equal, round, and reactive to light.  Neck: Normal range of motion. Neck supple.  Cardiovascular: Normal rate and regular rhythm.   Pulses:      Radial pulses are 2+ on the right side, and 2+ on the left side.  Pulmonary/Chest: Effort normal and breath sounds normal. No accessory muscle usage or stridor. No respiratory distress. He has no wheezes. He has no rhonchi. He has no rales.  Abdominal: Soft. Bowel sounds are normal. He exhibits no distension. There is no tenderness.  Musculoskeletal: He exhibits tenderness.       Right elbow: Normal.      Right wrist: He exhibits decreased range of motion, tenderness and bony tenderness. He exhibits no swelling and no crepitus.       Right hand: Normal. He exhibits normal capillary refill. Normal sensation noted. Normal strength noted.  No c/t/l midline tenderness.   Lymphadenopathy:    He has no cervical adenopathy.  Neurological: He is alert and oriented to person, place, and time.  Mental Status:   AOx3.  Speech clear without dysarthria. Cranial Nerves:  I-not tested  II-PERRLA  III, IV, VI-EOMs intact  V-temporal and masseter strength intact  VII-symmetrical facial movements intact, no facial droop  VIII-hearing grossly intact bilaterally  IX, X-gag intact  XI-strength of sternomastoid and trapezius muscles 5/5  XII-tongue midline Motor:   Good muscle bulk and tone  Strength 5/5 bilaterally  in upper and lower extremities   Cerebellar--intact RAMs, finger to nose intact bilaterally.  Gait normal  No pronator drift Sensory:  Intact in upper and lower extremities  Skin: Skin is warm and dry.  Psychiatric: He has a normal mood and affect. His behavior is normal.     ED Treatments / Results  DIAGNOSTIC STUDIES: Oxygen Saturation is 94% on RA, adequate by my interpretation.   COORDINATION OF CARE: 12:26 PM- Will X-Ray left wrist. Pt verbalizes understanding and agrees to plan.  Medications - No data to display  Labs (all labs ordered are listed, but only abnormal results are displayed) Labs Reviewed - No data to display  EKG  EKG Interpretation None       Radiology Dg Wrist Complete Left  Result Date: 08/04/2016 CLINICAL DATA:  Fall on ice yesterday with left wrist pain. Initial encounter. EXAM: LEFT WRIST - COMPLETE 3+ VIEW COMPARISON:  None. FINDINGS: There is no evidence of fracture or dislocation. There is no evidence of arthropathy or other focal bone abnormality. Soft tissues are unremarkable. IMPRESSION: Negative. Electronically Signed   By: Irish LackGlenn  Yamagata M.D.   On: 08/04/2016 13:04    Procedures Procedures (including critical care time)  Medications Ordered in ED Medications - No data to display   Initial Impression / Assessment and Plan / ED Course  I have reviewed the triage vital signs and the nursing notes.  Pertinent labs & imaging results that were available during my care of the patient were reviewed by me and considered in my medical decision making (see chart for details).    Patient with mechanical fall now with right wrist pain. He does have tenderness in the anatomical snuffbox. Initial plain films negative. However, concern for possible occult fracture. Patient placed in thumb spica splint. He is instructed to follow-up with hand surgery in the next 1-2 weeks for repeat imaging.  Discussed no narcotic refills as he has multiple  prescriptions for methadone and hydrocodone last filled 08/03/16. Return precautions discussed. Stable for discharge.  I personally  performed the services described in this documentation, which was scribed in my presence. The recorded information has been reviewed and is accurate.   Final Clinical Impressions(s) / ED Diagnoses   Final diagnoses:  Fall, initial encounter  Right wrist pain    New Prescriptions New Prescriptions   No medications on file     Cheri Fowler, PA-C 08/04/16 1314    Arby Barrette, MD 08/04/16 1717

## 2016-08-06 ENCOUNTER — Other Ambulatory Visit: Payer: Self-pay | Admitting: Internal Medicine

## 2016-08-06 ENCOUNTER — Telehealth: Payer: Self-pay | Admitting: *Deleted

## 2016-08-06 DIAGNOSIS — J189 Pneumonia, unspecified organism: Secondary | ICD-10-CM | POA: Insufficient documentation

## 2016-08-06 NOTE — Telephone Encounter (Signed)
Patient notified and agreed.  

## 2016-08-06 NOTE — Telephone Encounter (Signed)
Patient called and stated that he thinks his pneumonia is worse and wants another Chest X-Ray. States he is SOB. Please advise.

## 2016-08-06 NOTE — Telephone Encounter (Signed)
I entered order for CXR. Ask him to go to radiology.

## 2016-08-07 ENCOUNTER — Ambulatory Visit
Admission: RE | Admit: 2016-08-07 | Discharge: 2016-08-07 | Disposition: A | Discharge status: Home / Self Care | Payer: Medicare Other | Source: Ambulatory Visit | Attending: Internal Medicine | Admitting: Internal Medicine

## 2016-08-07 ENCOUNTER — Other Ambulatory Visit: Payer: Self-pay | Admitting: Nurse Practitioner

## 2016-08-07 DIAGNOSIS — J189 Pneumonia, unspecified organism: Secondary | ICD-10-CM

## 2016-08-13 ENCOUNTER — Other Ambulatory Visit: Payer: Self-pay | Admitting: Internal Medicine

## 2016-08-17 ENCOUNTER — Other Ambulatory Visit: Payer: Self-pay | Admitting: Internal Medicine

## 2016-08-17 DIAGNOSIS — J209 Acute bronchitis, unspecified: Secondary | ICD-10-CM

## 2016-08-17 NOTE — Telephone Encounter (Signed)
Last filled 07/25/16, Dr.Green please advise if ok to refill?  If this a maintenance medication for patient?

## 2016-08-17 NOTE — Telephone Encounter (Signed)
This can be refilled. It is a "maintainence medication". He has chronic bronchitis with frequent acute exacerbations and a persistent cough.

## 2016-08-18 ENCOUNTER — Other Ambulatory Visit: Payer: Self-pay | Admitting: Internal Medicine

## 2016-08-18 DIAGNOSIS — J209 Acute bronchitis, unspecified: Secondary | ICD-10-CM

## 2016-08-20 ENCOUNTER — Other Ambulatory Visit: Payer: Self-pay | Admitting: Internal Medicine

## 2016-08-20 DIAGNOSIS — J209 Acute bronchitis, unspecified: Secondary | ICD-10-CM

## 2016-08-21 ENCOUNTER — Encounter: Payer: Self-pay | Admitting: Internal Medicine

## 2016-08-21 ENCOUNTER — Ambulatory Visit (INDEPENDENT_AMBULATORY_CARE_PROVIDER_SITE_OTHER): Payer: Medicare Other | Admitting: Internal Medicine

## 2016-08-21 VITALS — BP 160/84 | HR 75 | Temp 98.4°F | Ht 68.0 in | Wt 221.0 lb

## 2016-08-21 DIAGNOSIS — R251 Tremor, unspecified: Secondary | ICD-10-CM | POA: Diagnosis not present

## 2016-08-21 DIAGNOSIS — R059 Cough, unspecified: Secondary | ICD-10-CM

## 2016-08-21 DIAGNOSIS — R5381 Other malaise: Secondary | ICD-10-CM

## 2016-08-21 DIAGNOSIS — J189 Pneumonia, unspecified organism: Secondary | ICD-10-CM | POA: Diagnosis not present

## 2016-08-21 DIAGNOSIS — J209 Acute bronchitis, unspecified: Secondary | ICD-10-CM | POA: Diagnosis not present

## 2016-08-21 DIAGNOSIS — G894 Chronic pain syndrome: Secondary | ICD-10-CM | POA: Diagnosis not present

## 2016-08-21 DIAGNOSIS — M79605 Pain in left leg: Secondary | ICD-10-CM

## 2016-08-21 DIAGNOSIS — R634 Abnormal weight loss: Secondary | ICD-10-CM | POA: Diagnosis not present

## 2016-08-21 DIAGNOSIS — R05 Cough: Secondary | ICD-10-CM

## 2016-08-21 DIAGNOSIS — I10 Essential (primary) hypertension: Secondary | ICD-10-CM

## 2016-08-21 DIAGNOSIS — K219 Gastro-esophageal reflux disease without esophagitis: Secondary | ICD-10-CM

## 2016-08-21 DIAGNOSIS — M79606 Pain in leg, unspecified: Secondary | ICD-10-CM | POA: Insufficient documentation

## 2016-08-21 DIAGNOSIS — M79604 Pain in right leg: Secondary | ICD-10-CM | POA: Diagnosis not present

## 2016-08-21 MED ORDER — HYDROCODONE-ACETAMINOPHEN 7.5-325 MG/15ML PO SOLN: ORAL | 0 refills | Status: DC

## 2016-08-21 NOTE — Progress Notes (Signed)
Facility  [facility]    Place of Service:   OFFICE    Allergies  Allergen Reactions  . Codeine   . Demerol   . Disalcid [Salsalate]   . Feldene [Piroxicam]   . Penicillins     Has patient had a PCN reaction causing immediate rash, facial/tongue/throat swelling, SOB or lightheadedness with hypotension: NO Has patient had a PCN reaction causing severe rash involving mucus membranes or skin necrosis: NO Has patient had a PCN reaction that required hospitalization NO Has patient had a PCN reaction occurring within the last 10 years: No If all of the above answers are "NO", then may proceed with Cephalosporin use.  Austin Barnes Antibiotics     Chief Complaint  Patient presents with  . Medical Management of Chronic Issues     medication management chronic pain, blood sugar, blood pressure.     HPI:  Larey Seat on ice 08/04/16. Hurt wrist. Went to Er. Xray was normal.  Pain down the right and left legs from hip to knee.   Feet are painful.  Talks about the poor circulation of the legs. Brought me a report from dr. Edilia Bo from 09/05/2009.  Persistent rattling, wheezy cough.  Persistent nausea. Vomiting at times.  Has noted a coarsening of his tremor ever since he fell on 08/04/16. Mostly In the legs  BP remains elevated.  Medications: Patient's Medications  New Prescriptions   No medications on file  Previous Medications   ANDROGEL PUMP 20.25 MG/ACT (1.62%) GEL    APPLY 2 PUMP ACTUATIONS DAILY FOR TESTOSTERONE REPLACEMENT.   BACLOFEN (LIORESAL) 10 MG TABLET    TAKE 1 TABLET THREE TIMES DAILY AS NEEDED FOR MUSCLE RELAXATION.   DENTA 5000 PLUS 1.1 % CREA DENTAL CREAM    BRUSH ON TEETH FOR 2 MINUTES THEN SWISH AND EXPECTORATE.   DIAZEPAM (VALIUM) 10 MG TABLET    TAKE 1 TABLET UP TO FOUR TIMES DAILY TO RELAX MUSCLES.   HYDROCODONE-ACETAMINOPHEN (HYCET) 7.5-325 MG/15 ML SOLUTION    TAKE 1/2 TEASPOONFUL (2.5ML) UP TO 4 TIMES A DAY IN 24 HOURS FOR COUGH.   HYDROCODONE-ACETAMINOPHEN (NORCO)  7.5-325 MG TABLET    Take one tablet every 6 hour if needed for pain   HYDROMORPHONE (DILAUDID) 8 MG TABLET    Take one tablet every 3 hours to control pain   LEVOTHYROXINE (SYNTHROID, LEVOTHROID) 50 MCG TABLET    TAKE 1 TABLET EACH DAY FOR THYROID.   LIDOCAINE (XYLOCAINE) 5 % OINTMENT    APPLY TWICE A DAY AS NEEDED FOR PAIN.   LOSARTAN (COZAAR) 50 MG TABLET    One daily to control BP   METHADONE (DOLOPHINE) 10 MG TABLET    Take four tablets every 6 hours to control pain   MINOXIDIL (LONITEN) 10 MG TABLET    TAKE 1 TABLET DAILY TO CONTROL BLOOD PRESSURE.   NITRO-BID 2 % OINTMENT    APPLY 1 INCH STRIP TO FEET TWICE DAILY.   OMEPRAZOLE (PRILOSEC) 20 MG CAPSULE    TAKE (2) CAPSULES TWICE DAILY.   OXYCODONE HCL 10 MG TABS    Take four tablets by mouth every 6 hours as needed for pain   PROCHLORPERAZINE (COMPAZINE) 10 MG TABLET    TAKE 1 TABLET EVERY 8 HOURS AS NEEDED FOR NAUSEA.   TEMAZEPAM (RESTORIL) 30 MG CAPSULE    TAKE 1 CAPSULE ONCE DAILY AT BEDTIME AS NEEDED FOR REST.   TRAMADOL (ULTRAM) 50 MG TABLET    TAKE 1 TABLET UP TO FOUR  TIMES DAILY AS NEEDED FOR PAIN.   VOLTAREN 1 % GEL    APPLY 2-4GM TO AFFECTED AREA UP TO 4 TIMES A DAY  Modified Medications   No medications on file  Discontinued Medications   BENZONATATE (TESSALON PERLES) 100 MG CAPSULE    Take one capsule by mouth twice daily as needed for cough   DOXYCYCLINE (VIBRA-TABS) 100 MG TABLET    One twice daily for infection    Review of Systems  Constitutional: Positive for activity change and unexpected weight change (25# loss since Dec 2016 weight of 205#). Negative for appetite change.       Chronically ill  HENT: Negative for congestion and rhinorrhea.   Eyes: Positive for visual disturbance.  Respiratory: Positive for cough, chest tightness and shortness of breath.        Producing sputum  Cardiovascular: Positive for leg swelling. Negative for chest pain and palpitations.       History of peripheral artery disease.    Gastrointestinal: Positive for diarrhea.       History of chronic and recurrent pancreatitis. Loose stools from time to time. Upper GI distress and epigastric pain.  Endocrine: Positive for polydipsia.       History of diabetes mellitus.  Musculoskeletal: Positive for arthralgias, back pain, gait problem, joint swelling (Right knee), myalgias, neck pain and neck stiffness.       Pain in both hips.  Skin: Negative.   Allergic/Immunologic: Negative.   Neurological: Positive for dizziness, tremors, weakness and numbness.  Hematological:       History of anemia.  Psychiatric/Behavioral: Positive for decreased concentration and dysphoric mood. Negative for agitation, behavioral problems, confusion, hallucinations and suicidal ideas. The patient is nervous/anxious.     Vitals:   08/21/16 1527  BP: (!) 160/84  Pulse: 75  Temp: 98.4 F (36.9 C)  TempSrc: Oral  SpO2: 97%  Weight: 221 lb (100.2 kg)  Height: 5\' 8"  (1.727 m)   Body mass index is 33.6 kg/m. Wt Readings from Last 3 Encounters:  08/21/16 221 lb (100.2 kg)  08/04/16 195 lb (88.5 kg)  01/11/16 183 lb (83 kg)      Physical Exam  Constitutional: He is oriented to person, place, and time.  Mesomorphic. Rambling in thought content. Chronically ill appearance.  HENT:  Head: Normocephalic and atraumatic.  Right Ear: External ear normal.  Left Ear: External ear normal.  Nose: Nose normal.  Mouth/Throat: Oropharynx is clear and moist.  Eyes: Conjunctivae and EOM are normal. Pupils are equal, round, and reactive to light.  Neck: No JVD present. No tracheal deviation present. No thyromegaly present.  Cardiovascular: Normal rate, regular rhythm and normal heart sounds.  Exam reveals no gallop and no friction rub.   No murmur heard. Pulmonary/Chest: No respiratory distress. He has no wheezes. He has no rales.  Abdominal: He exhibits no distension and no mass. There is no tenderness.  Musculoskeletal: He exhibits edema and  tenderness.  Muscular weakness and pains. Unstable gait. Uses walker. Velcro brace on the right knee. Ganglion cyst left wrist dorsum. Exquisitely tender to palpation at the left elbow malleolus.  Lymphadenopathy:    He has no cervical adenopathy.  Neurological: He is alert and oriented to person, place, and time. He displays abnormal reflex. No cranial nerve deficit. Coordination abnormal.  tremor  Skin: No rash noted. No erythema. There is pallor.  Psychiatric:  Rambling speech. Frustrated.  Vitals reviewed.   Labs reviewed: No flowsheet data found. Lab Results  Component  Value Date   TSH 1.320 12/14/2015   TSH 3.890 12/14/2014   TSH 2.080 01/27/2014   Lab Results  Component Value Date   BUN 16 12/14/2015   BUN 17 12/14/2014   BUN 20 01/27/2014   Lab Results  Component Value Date   HGBA1C 5.8 (H) 12/14/2015   HGBA1C 5.8 (H) 12/14/2014   HGBA1C 6.0 (H) 01/27/2014    Assessment/Plan  1. Acute bronchitis, unspecified organism Chronic and recurrent nature of this problem makes it unlikely there is any point in prolonging antibiotics. - HYDROcodone-acetaminophen (HYCET) 7.5-325 mg/15 ml solution; One tsp up to 4 times in 24 hours to help control cough  Dispense: 480 mL; Refill: 0 - Respiratory or Resp and Sputum Culture; Future  2. Chronic pain syndrome Continue current medications. 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  3. Essential hypertension Modest elevation in the systolic blood pressure.  4. Community acquired pneumonia, unspecified laterality Other pneumonia or atelectasis was noted on chest x-ray 07/17/2016. His recent chest x-ray shows no active lung disease.  5. Cough This is a chronic problem which is been treated for years with cough medications containing codeine derivatives. Patient has a chronic bronchitis  and undergoes acute exacerbations. He had originally sent pneumonia which also caused an increase in his cough.  6. Malaise Patient is quite depressed about the limitations on his life brought on by the chronic pains in his spine, back, knees, and hips. He has peripheral arterial disease which also contributes to significant discomfort in the lower legs with claudication. His nausea prevents him from eating a proper diet.  7. Loss of weight Currently stable  8. Gastroesophageal reflux disease, esophagitis presence not specified May be relate to nausea. He does not think he can afford to see Gi specialist at this time.  9. Pain in both lower extremities - DG FEMUR, MIN 2 VIEWS RIGHT; Future - DG Knee Complete 4 Views Right; Future - DG HIP UNILAT W OR W/O PELVIS 2-3 VIEWS RIGHT; Future - C-reactive Protein; Future - Sedimentation rate; Future - CK; Future  10. Tremor Increasing. Seems like BET to me. Continue to observe.

## 2016-08-24 ENCOUNTER — Other Ambulatory Visit: Payer: Self-pay | Admitting: *Deleted

## 2016-08-24 ENCOUNTER — Other Ambulatory Visit: Payer: Self-pay

## 2016-08-24 DIAGNOSIS — J209 Acute bronchitis, unspecified: Secondary | ICD-10-CM

## 2016-08-24 DIAGNOSIS — G894 Chronic pain syndrome: Secondary | ICD-10-CM

## 2016-08-24 MED ORDER — HYDROMORPHONE HCL 8 MG PO TABS: ORAL_TABLET | ORAL | 0 refills | Status: DC

## 2016-08-24 NOTE — Telephone Encounter (Signed)
Discarded previous Rx due to placing under Dr. Thomasene LotGreen's name. Reprinted.

## 2016-08-24 NOTE — Telephone Encounter (Signed)
Patient requested and will pick up 

## 2016-08-27 ENCOUNTER — Other Ambulatory Visit: Payer: Self-pay | Admitting: Internal Medicine

## 2016-08-28 LAB — RESPIRATORY CULTURE OR RESPIRATORY AND SPUTUM CULTURE

## 2016-08-30 ENCOUNTER — Telehealth: Payer: Self-pay

## 2016-08-30 MED ORDER — CIPROFLOXACIN HCL 500 MG PO TABS: 500.0000 mg | ORAL_TABLET | Freq: Two times a day (BID) | ORAL | 0 refills | Status: DC

## 2016-08-30 NOTE — Telephone Encounter (Signed)
Discussed results with patient, patient verbalized understanding of results  [facility]  Patient asked if he can have a rx for Gabapentin 300 mg by mouth twice daily, patient has an old rx that he is taking and it seems to help with back and leg nerve pain.  Please advise

## 2016-08-30 NOTE — Telephone Encounter (Signed)
-----   Message from Kimber RelicArthur G Green, MD sent at 08/29/2016  5:26 PM EST ----- Advise patient of his culture report. Treat with Cipro 500 mg bid x 10 days. Call in prescription for him.

## 2016-08-31 ENCOUNTER — Other Ambulatory Visit: Payer: Self-pay | Admitting: *Deleted

## 2016-08-31 ENCOUNTER — Other Ambulatory Visit: Payer: Self-pay

## 2016-08-31 DIAGNOSIS — J209 Acute bronchitis, unspecified: Secondary | ICD-10-CM

## 2016-08-31 MED ORDER — HYDROCODONE-ACETAMINOPHEN 7.5-325 MG PO TABS: ORAL_TABLET | ORAL | 0 refills | Status: DC

## 2016-08-31 MED ORDER — METHADONE HCL 10 MG PO TABS: ORAL_TABLET | ORAL | 0 refills | Status: DC

## 2016-08-31 MED ORDER — OXYCODONE HCL 10 MG PO TABS: ORAL_TABLET | ORAL | 0 refills | Status: DC

## 2016-08-31 MED ORDER — HYDROCODONE-ACETAMINOPHEN 7.5-325 MG/15ML PO SOLN: ORAL | 0 refills | Status: DC

## 2016-08-31 MED ORDER — GABAPENTIN 300 MG PO CAPS: 300.0000 mg | ORAL_CAPSULE | Freq: Two times a day (BID) | ORAL | 3 refills | Status: DC

## 2016-08-31 MED ORDER — TEMAZEPAM 30 MG PO CAPS: ORAL_CAPSULE | ORAL | 0 refills | Status: DC

## 2016-08-31 NOTE — Addendum Note (Signed)
Addended by: Maurice SmallBEATTY, Analisia Kingsford C on: 08/31/2016 04:33 PM   Modules accepted: Orders

## 2016-08-31 NOTE — Addendum Note (Signed)
Addended by: Sueanne MargaritaSMITH, Elizabella Nolet L on: 08/31/2016 03:47 PM   Modules accepted: Orders

## 2016-08-31 NOTE — Telephone Encounter (Signed)
Pt calling asking for early refill of Hycet cough syrup, last refill was 08/21/16 per physicians in the office to early for refill. Per pt he has pneumonia. Please advise

## 2016-08-31 NOTE — Telephone Encounter (Signed)
Medication refills for oxycodone 10 mg, hydrocodone 7.5-325 mg, methadone 10 mg, temazepam 30 mg. Rx were placed in Dr. Ernest Mallickeed's for signing. Patient will be called once all Rx have been signed.

## 2016-08-31 NOTE — Telephone Encounter (Signed)
RX sent for Gabapentin.

## 2016-08-31 NOTE — Addendum Note (Signed)
Addended by: Sueanne MargaritaSMITH, Arris Meyn L on: 08/31/2016 03:48 PM   Modules accepted: Orders

## 2016-08-31 NOTE — Telephone Encounter (Signed)
Patient called office on 08/31/16 requesting that Dr Chilton SiGreen give him a call. After explaining that Dr Chilton SiGreen would be out of the office until next week, patient asked about the results he received yesterday. He stated that his pharmacist told him that he has an ear infection, UTI, and lung infection based on the fact that he is taking cipro. I reread patient the results from yesterday and looked through his recent lab results and OV notes for mention of ear infection. The only UA/Culture was from 11/2015 and it was negative for infection. After hearing this patient stated that he now understood and that he did not need for Dr. Chilton SiGreen to call him. All he needed was reassurance that the cipro would be effective for the C&S results done on his sputum. Patient verbalized understanding that bacteria was sensitive to cipro and that it should would well. Patient was advised to call the office if he was still having symptoms after completing the full 10 days worth of cipro.

## 2016-08-31 NOTE — Telephone Encounter (Signed)
It is Ok to send prescription for gabapentin 300 mg (180 tablets) one twice daily to help nerve pains. 3 refills.

## 2016-09-04 ENCOUNTER — Telehealth: Payer: Self-pay | Admitting: *Deleted

## 2016-09-04 ENCOUNTER — Other Ambulatory Visit: Payer: Self-pay | Admitting: Internal Medicine

## 2016-09-04 NOTE — Telephone Encounter (Signed)
[facility] sent a fax stating "Austin Barnes wants to take 2 tablets up to 4 times daily, is that ok? Yes or No"  Per Dr. Juventino SlovakGreen--NO.   Called pharmacy and spoke with [place]. [phone]

## 2016-09-06 ENCOUNTER — Other Ambulatory Visit: Payer: Self-pay | Admitting: Internal Medicine

## 2016-09-06 ENCOUNTER — Telehealth: Payer: Self-pay

## 2016-09-06 NOTE — Telephone Encounter (Signed)
I agree with the advice given.

## 2016-09-06 NOTE — Telephone Encounter (Signed)
Patient called requesting appointment for today, no available appointments at the time of call. Patient c/o sudden onset of swelling in feet, left side rib pain, and ongoing productive cough. Patient denies fever.  I informed patient that he needs to seek medical attention at the ER to r/o heart realted concerns to f/u on left side rib pain. Patient refused ER and said he knows what heart problems feel like and he is sure this is not heart related  In office provider unable to work patient in.   Please advise

## 2016-09-06 NOTE — Telephone Encounter (Signed)
Patient aware I have not heard back from Murray HodgkinsArthur Green, MD and advise again that patient seek medical attention at the Emergency Room, patient refused. Patient scheduled appointment to see Dr.Green next Wednesday. Patient refused appointment with another provider for an earlier date

## 2016-09-07 ENCOUNTER — Other Ambulatory Visit: Payer: Self-pay | Admitting: *Deleted

## 2016-09-07 MED ORDER — DIAZEPAM 10 MG PO TABS: ORAL_TABLET | ORAL | 0 refills | Status: DC

## 2016-09-07 NOTE — Telephone Encounter (Signed)
Patient aware Dr.Green recommends that he seeks medical attention, patient refused and states he was seen in the ER for these concerns before and ended up with a 1000 dollar. Patient will keep pending appointment for Wednesday with Dr.Green

## 2016-09-07 NOTE — Telephone Encounter (Signed)
[facility]  

## 2016-09-12 ENCOUNTER — Other Ambulatory Visit: Payer: Self-pay | Admitting: Internal Medicine

## 2016-09-12 ENCOUNTER — Encounter: Payer: Self-pay | Admitting: Internal Medicine

## 2016-09-12 ENCOUNTER — Ambulatory Visit (INDEPENDENT_AMBULATORY_CARE_PROVIDER_SITE_OTHER): Payer: Medicare Other | Admitting: Internal Medicine

## 2016-09-12 VITALS — BP 152/84 | HR 74 | Temp 98.2°F | Ht 70.0 in | Wt 222.0 lb

## 2016-09-12 DIAGNOSIS — E349 Endocrine disorder, unspecified: Secondary | ICD-10-CM

## 2016-09-12 DIAGNOSIS — M79605 Pain in left leg: Secondary | ICD-10-CM | POA: Diagnosis not present

## 2016-09-12 DIAGNOSIS — M25559 Pain in unspecified hip: Secondary | ICD-10-CM | POA: Diagnosis not present

## 2016-09-12 DIAGNOSIS — K861 Other chronic pancreatitis: Secondary | ICD-10-CM

## 2016-09-12 DIAGNOSIS — G894 Chronic pain syndrome: Secondary | ICD-10-CM

## 2016-09-12 DIAGNOSIS — R059 Cough, unspecified: Secondary | ICD-10-CM

## 2016-09-12 DIAGNOSIS — J209 Acute bronchitis, unspecified: Secondary | ICD-10-CM

## 2016-09-12 DIAGNOSIS — M79604 Pain in right leg: Secondary | ICD-10-CM

## 2016-09-12 DIAGNOSIS — R05 Cough: Secondary | ICD-10-CM

## 2016-09-12 DIAGNOSIS — I10 Essential (primary) hypertension: Secondary | ICD-10-CM | POA: Diagnosis not present

## 2016-09-12 MED ORDER — HYDROCODONE-ACETAMINOPHEN 7.5-325 MG/15ML PO SOLN: ORAL | 0 refills | Status: DC

## 2016-09-12 MED ORDER — HYDROCHLOROTHIAZIDE 25 MG PO TABS: ORAL_TABLET | ORAL | 3 refills | Status: DC

## 2016-09-12 NOTE — Progress Notes (Signed)
Facility  [facility]    Place of Service:   OFFICE    Allergies  Allergen Reactions  . Codeine   . Demerol   . Disalcid [Salsalate]   . Feldene [Piroxicam]   . Penicillins     Has patient had a PCN reaction causing immediate rash, facial/tongue/throat swelling, SOB or lightheadedness with hypotension: NO Has patient had a PCN reaction causing severe rash involving mucus membranes or skin necrosis: NO Has patient had a PCN reaction that required hospitalization NO Has patient had a PCN reaction occurring within the last 10 years: No If all of the above answers are "NO", then may proceed with Cephalosporin use.  [name]     Chief Complaint  Patient presents with  . Acute Visit    left side rib pain, some time after his fall 08/04/16  . Foot Swelling    both, couldn't get shoes, some better-has pictures  . Cough    on going cough    HPI:  Was having swollen legs and feet last week. Better since using HCTZ.  Pain in the both legs at level of thigh laterally.  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  Cough - requests refill of cough medication  Still vomiting at times  Medications: Patient's Medications  New Prescriptions   No medications on file  Previous Medications   ANDROGEL PUMP 20.25 MG/ACT (1.62%) GEL    APPLY 2 PUMP ACTUATIONS DAILY FOR TESTOSTERONE REPLACEMENT.   BACLOFEN (LIORESAL) 10 MG TABLET    TAKE 1 TABLET THREE TIMES DAILY AS NEEDED FOR MUSCLE RELAXATION.   CIPROFLOXACIN (CIPRO) 500 MG TABLET    Take 1 tablet (500 mg total) by mouth 2 (two) times daily.   DENTA 5000 PLUS 1.1 % CREA DENTAL CREAM    BRUSH ON TEETH FOR 2 MINUTES THEN SWISH AND EXPECTORATE.   DIAZEPAM (VALIUM) 10 MG TABLET    Take one tablet by mouth up to four times daily to relax muscles   GABAPENTIN (NEURONTIN) 300 MG CAPSULE    Take 1 capsule (300 mg total) by mouth 2 (two) times daily.   HYDROCODONE-ACETAMINOPHEN (HYCET) 7.5-325 MG/15 ML SOLUTION    One tsp up to 4 times in 24 hours to help control cough   HYDROCODONE-ACETAMINOPHEN (NORCO) 7.5-325 MG TABLET    Take one tablet every 6 hour if needed for pain   HYDROMORPHONE (DILAUDID) 8 MG TABLET    Take one tablet every 3 hours to control pain   LEVOTHYROXINE (SYNTHROID, LEVOTHROID) 50 MCG TABLET    TAKE 1 TABLET EACH DAY FOR THYROID.   LIDOCAINE (XYLOCAINE) 5 % OINTMENT    APPLY TWICE A DAY AS NEEDED FOR PAIN.   LOSARTAN (COZAAR) 50 MG TABLET    One daily to control BP   METHADONE (DOLOPHINE) 10 MG TABLET    Take four tablets every 6 hours to control pain   MINOXIDIL (LONITEN) 10 MG TABLET    TAKE 1 TABLET DAILY TO CONTROL BLOOD PRESSURE.   NITRO-BID 2 % OINTMENT    APPLY 1 INCH STRIP TO FEET TWICE DAILY.   OMEPRAZOLE (PRILOSEC) 20 MG CAPSULE    TAKE (2) CAPSULES TWICE DAILY.  OXYCODONE HCL 10 MG TABS    Take four tablets by mouth every 6 hours as needed for pain   PROCHLORPERAZINE (COMPAZINE) 10 MG TABLET    TAKE 1 TABLET EVERY 8 HOURS AS NEEDED FOR NAUSEA.   TEMAZEPAM (RESTORIL) 30 MG CAPSULE    TAKE 1 CAPSULE ONCE DAILY AT BEDTIME AS NEEDED FOR REST.   TRAMADOL (ULTRAM) 50 MG TABLET    TAKE 1 TABLET UP TO FOUR TIMES DAILY AS NEEDED FOR PAIN.   VOLTAREN 1 % GEL    APPLY 2-4GM TO AFFECTED AREA UP TO 4 TIMES A DAY  Modified Medications   No medications on file  Discontinued Medications   No medications on file    Review of Systems  Constitutional: Positive for activity change and unexpected weight change (25# loss since Dec 2016 weight of 205#). Negative for appetite change.       Chronically ill  HENT: Negative for congestion and rhinorrhea.   Eyes: Positive for visual disturbance.  Respiratory: Positive for cough,  chest tightness and shortness of breath.        Producing sputum  Cardiovascular: Positive for leg swelling. Negative for chest pain and palpitations.       History of peripheral artery disease.  Gastrointestinal: Positive for diarrhea.       History of chronic and recurrent pancreatitis. Loose stools from time to time. Upper GI distress and epigastric pain.  Endocrine: Positive for polydipsia.       History of diabetes mellitus.  Musculoskeletal: Positive for arthralgias, back pain, gait problem, joint swelling (Right knee), myalgias, neck pain and neck stiffness.       Pain in both hips.  Skin: Negative.   Allergic/Immunologic: Negative.   Neurological: Positive for dizziness, tremors, weakness and numbness.  Hematological:       History of anemia.  Psychiatric/Behavioral: Positive for decreased concentration and dysphoric mood. Negative for agitation, behavioral problems, confusion, hallucinations and suicidal ideas. The patient is nervous/anxious.     Vitals:   09/12/16 1355  BP: (!) 152/84  Pulse: 74  Temp: 98.2 F (36.8 C)  TempSrc: Oral  SpO2: 97%  Weight: 222 lb (100.7 kg)  Height: 5\' 10"  (1.778 m)   Body mass index is 31.85 kg/m. Wt Readings from Last 3 Encounters:  09/12/16 222 lb (100.7 kg)  08/21/16 221 lb (100.2 kg)  08/04/16 195 lb (88.5 kg)      Physical Exam  Constitutional: He is oriented to person, place, and time.  Mesomorphic. Rambling in thought content. Chronically ill appearance.  HENT:  Head: Normocephalic and atraumatic.  Right Ear: External ear normal.  Left Ear: External ear normal.  Nose: Nose normal.  Mouth/Throat: Oropharynx is clear and moist.  Eyes: Conjunctivae and EOM are normal. Pupils are equal, round, and reactive to light.  Neck: No JVD present. No tracheal deviation present. No thyromegaly present.  Cardiovascular: Normal rate, regular rhythm and normal heart sounds.  Exam reveals no gallop and no friction rub.   No murmur  heard. Pulmonary/Chest: No respiratory distress. He has no wheezes. He has no rales.  Abdominal: He exhibits no distension and no mass. There is no tenderness.  Musculoskeletal: He exhibits edema and tenderness.  Muscular weakness and pains. Unstable gait. Uses walker. Velcro brace on the right knee. Ganglion cyst left wrist dorsum. Exquisitely tender to palpation at the left elbow malleolus.  Lymphadenopathy:    He has no cervical adenopathy.  Neurological: He is alert and oriented to person, place,  and time. He displays abnormal reflex. No cranial nerve deficit. Coordination abnormal.  tremor  Skin: No rash noted. No erythema. There is pallor.  Psychiatric:  Rambling speech. Frustrated.  Vitals reviewed.   Labs reviewed: No flowsheet data found. Lab Results  Component Value Date   TSH 1.320 12/14/2015   TSH 3.890 12/14/2014   TSH 2.080 01/27/2014   Lab Results  Component Value Date   BUN 16 12/14/2015   BUN 17 12/14/2014   BUN 20 01/27/2014   Lab Results  Component Value Date   HGBA1C 5.8 (H) 12/14/2015   HGBA1C 5.8 (H) 12/14/2014   HGBA1C 6.0 (H) 01/27/2014    Assessment/Plan  1. Arthralgia of hip, unspecified laterality Get Xrays as recommended  2. Pain in both lower extremities Get xrays  3. Chronic pain syndrome Continue current meds  4. Essential hypertension - hydrochlorothiazide (HYDRODIURIL) 25 MG tablet; One daily to help control BP and to reduce edema  Dispense: 90 tablet; Refill: 3  5. Testosterone insufficiency - Testosterone; Future  6. Cough Refill Hycet  7. Acute bronchitis, unspecified organism Refill Hycet

## 2016-09-13 ENCOUNTER — Other Ambulatory Visit: Payer: Self-pay | Admitting: Internal Medicine

## 2016-09-13 ENCOUNTER — Ambulatory Visit
Admission: RE | Admit: 2016-09-13 | Discharge: 2016-09-13 | Disposition: A | Discharge status: Home / Self Care | Payer: Medicare Other | Source: Ambulatory Visit | Attending: Internal Medicine | Admitting: Internal Medicine

## 2016-09-13 ENCOUNTER — Telehealth: Payer: Self-pay

## 2016-09-13 DIAGNOSIS — M898X9 Other specified disorders of bone, unspecified site: Secondary | ICD-10-CM

## 2016-09-13 DIAGNOSIS — M79604 Pain in right leg: Secondary | ICD-10-CM

## 2016-09-13 DIAGNOSIS — M79605 Pain in left leg: Principal | ICD-10-CM

## 2016-09-13 DIAGNOSIS — J209 Acute bronchitis, unspecified: Secondary | ICD-10-CM

## 2016-09-13 LAB — LIPASE: Lipase: 36 U/L (ref 7–60)

## 2016-09-13 LAB — AMYLASE: Amylase: 57 U/L (ref 0–105)

## 2016-09-13 LAB — SEDIMENTATION RATE: Sed Rate: 61 mm/hr — ABNORMAL HIGH (ref 0–20)

## 2016-09-13 LAB — CK: Total CK: 109 U/L (ref 7–232)

## 2016-09-13 LAB — C-REACTIVE PROTEIN: CRP: 16.4 mg/L — AB (ref <8.0)

## 2016-09-13 MED ORDER — PREDNISONE 5 MG PO TABS: ORAL_TABLET | ORAL | 0 refills | Status: DC

## 2016-09-13 NOTE — Telephone Encounter (Signed)
Radiologist called to inform Dr.Green patient's pelvis, Right Knee and Femur reports are available for review  Please review and advise

## 2016-09-13 NOTE — Addendum Note (Signed)
Addended by: Chriss DriverLANE, TERRY L on: 09/13/2016 04:42 PM   Modules accepted: Orders

## 2016-09-13 NOTE — Telephone Encounter (Signed)
Schedule MRI of the right tibia. Advise patient of the need for this study due to a lesion in the tibia that was not seen on any of the other xray exams.

## 2016-09-14 NOTE — Telephone Encounter (Signed)
Left message on patient voice mail that the office has put in an order for the MRI of the leg at GI. They should be calling you with the date and time.

## 2016-09-14 NOTE — Telephone Encounter (Signed)
Patient notified and agreed.  

## 2016-09-14 NOTE — Telephone Encounter (Signed)
Called and LMOM to return call. Left message on his voicemail that [facility] will be giving him a call to schedule another test per Dr. Chilton SiGreen.   [facility] #[phone] stating MRI cannot be ordered just with contrast. She stated that it has to be with and without contrast or just without contrast. Order placed with and without. Went by Dr. Thomasene LotGreen's recent answers on his order to place current order. I called [facility] and confirmed with Maralyn SagoSarah that order was in and placed correctly. Order received.

## 2016-09-15 ENCOUNTER — Other Ambulatory Visit: Payer: Self-pay | Admitting: Internal Medicine

## 2016-09-18 LAB — TESTOSTERONE: Testosterone: 19 ng/dL — ABNORMAL LOW (ref 250–827)

## 2016-09-20 ENCOUNTER — Telehealth: Payer: Self-pay | Admitting: *Deleted

## 2016-09-20 ENCOUNTER — Other Ambulatory Visit: Payer: Self-pay | Admitting: Internal Medicine

## 2016-09-20 ENCOUNTER — Ambulatory Visit
Admission: RE | Admit: 2016-09-20 | Discharge: 2016-09-20 | Disposition: A | Discharge status: Home / Self Care | Payer: Medicare Other | Source: Ambulatory Visit | Attending: Internal Medicine | Admitting: Internal Medicine

## 2016-09-20 DIAGNOSIS — M799 Soft tissue disorder, unspecified: Secondary | ICD-10-CM | POA: Diagnosis not present

## 2016-09-20 DIAGNOSIS — M898X9 Other specified disorders of bone, unspecified site: Secondary | ICD-10-CM

## 2016-09-20 NOTE — Telephone Encounter (Signed)
Patient called and requested results from MRI results. I called patient and he had already spoken with Austin Barnes regarding the results. Read them to him again and he agreed.

## 2016-09-21 ENCOUNTER — Other Ambulatory Visit: Payer: Self-pay

## 2016-09-21 ENCOUNTER — Telehealth: Payer: Self-pay

## 2016-09-21 DIAGNOSIS — G894 Chronic pain syndrome: Secondary | ICD-10-CM

## 2016-09-21 MED ORDER — HYDROMORPHONE HCL 8 MG PO TABS: ORAL_TABLET | ORAL | 0 refills | Status: DC

## 2016-09-21 NOTE — Telephone Encounter (Signed)
Patient called requesting copies of all radiology reports completed this month. Reports printed and placed at the front desk for pick-up

## 2016-09-24 ENCOUNTER — Other Ambulatory Visit: Payer: Medicare Other

## 2016-09-24 ENCOUNTER — Other Ambulatory Visit: Payer: Self-pay | Admitting: Internal Medicine

## 2016-09-24 DIAGNOSIS — J209 Acute bronchitis, unspecified: Secondary | ICD-10-CM

## 2016-09-24 LAB — BASIC METABOLIC PANEL
BUN: 32 mg/dL — AB (ref 7–25)
CO2: 27 mmol/L (ref 20–31)
CREATININE: 1.63 mg/dL — AB (ref 0.70–1.25)
Calcium: 9.4 mg/dL (ref 8.6–10.3)
Chloride: 97 mmol/L — ABNORMAL LOW (ref 98–110)
Glucose, Bld: 121 mg/dL — ABNORMAL HIGH (ref 65–99)
POTASSIUM: 5.9 mmol/L — AB (ref 3.5–5.3)
Sodium: 134 mmol/L — ABNORMAL LOW (ref 135–146)

## 2016-09-25 ENCOUNTER — Other Ambulatory Visit: Payer: Self-pay

## 2016-09-25 LAB — SEDIMENTATION RATE: Sed Rate: 34 mm/hr — ABNORMAL HIGH (ref 0–20)

## 2016-09-27 ENCOUNTER — Telehealth: Payer: Self-pay

## 2016-09-27 DIAGNOSIS — N289 Disorder of kidney and ureter, unspecified: Secondary | ICD-10-CM

## 2016-09-27 DIAGNOSIS — E875 Hyperkalemia: Secondary | ICD-10-CM

## 2016-09-27 DIAGNOSIS — R700 Elevated erythrocyte sedimentation rate: Secondary | ICD-10-CM

## 2016-09-27 NOTE — Telephone Encounter (Signed)
ESR is better at 34. There are some irregularities of the BMP; Mild renal insufficiency and K+ at 5.9. I don't think anything more needs to be doe with medications ay this time. Hydration is important. Needs to be drinking 1.5 to 2 quarts of fluid daily. Schedule follow up BMP for later next week. Repeat ESR too.

## 2016-09-27 NOTE — Telephone Encounter (Signed)
Discussed results with patient, patient verbalized understanding of results  Scheduled lab appointment for 10-02-16, future orders placed

## 2016-09-27 NOTE — Telephone Encounter (Signed)
Message left on clinical intake voicemail:   Patient would like lab results, please advise  Patient would also like to let Dr.Green know he requested records form [id number] from Dr.Jayln. Patient states hopefully these records will help clear up his diagnosis. (FYI)

## 2016-09-28 ENCOUNTER — Other Ambulatory Visit: Payer: Self-pay | Admitting: *Deleted

## 2016-09-28 ENCOUNTER — Other Ambulatory Visit: Payer: Medicare Other

## 2016-09-28 DIAGNOSIS — E875 Hyperkalemia: Secondary | ICD-10-CM | POA: Diagnosis not present

## 2016-09-28 DIAGNOSIS — N289 Disorder of kidney and ureter, unspecified: Secondary | ICD-10-CM

## 2016-09-28 DIAGNOSIS — R700 Elevated erythrocyte sedimentation rate: Secondary | ICD-10-CM

## 2016-09-28 LAB — BASIC METABOLIC PANEL WITH GFR
BUN: 22 mg/dL (ref 7–25)
CALCIUM: 9.1 mg/dL (ref 8.6–10.3)
CHLORIDE: 103 mmol/L (ref 98–110)
CO2: 27 mmol/L (ref 20–31)
Creat: 1.09 mg/dL (ref 0.70–1.25)
GFR, EST NON AFRICAN AMERICAN: 71 mL/min (ref >=60)
GFR, Est African American: 82 mL/min (ref >=60)
GLUCOSE: 104 mg/dL — AB (ref 65–99)
Potassium: 5 mmol/L (ref 3.5–5.3)
SODIUM: 138 mmol/L (ref 135–146)

## 2016-09-28 LAB — SEDIMENTATION RATE: Sed Rate: 66 mm/hr — ABNORMAL HIGH (ref 0–20)

## 2016-09-28 MED ORDER — TEMAZEPAM 30 MG PO CAPS: ORAL_CAPSULE | ORAL | 0 refills | Status: DC

## 2016-09-28 MED ORDER — OXYCODONE HCL 10 MG PO TABS: ORAL_TABLET | ORAL | 0 refills | Status: DC

## 2016-09-28 MED ORDER — METHADONE HCL 10 MG PO TABS: ORAL_TABLET | ORAL | 0 refills | Status: DC

## 2016-09-28 MED ORDER — HYDROCODONE-ACETAMINOPHEN 7.5-325 MG PO TABS: ORAL_TABLET | ORAL | 0 refills | Status: DC

## 2016-09-28 NOTE — Telephone Encounter (Signed)
Patient requested and will pick up 

## 2016-10-01 ENCOUNTER — Other Ambulatory Visit: Payer: Self-pay | Admitting: Internal Medicine

## 2016-10-02 ENCOUNTER — Other Ambulatory Visit: Payer: Self-pay

## 2016-10-02 ENCOUNTER — Other Ambulatory Visit: Payer: Self-pay | Admitting: Internal Medicine

## 2016-10-03 ENCOUNTER — Other Ambulatory Visit: Payer: Self-pay | Admitting: Internal Medicine

## 2016-10-04 ENCOUNTER — Other Ambulatory Visit: Payer: Self-pay | Admitting: Internal Medicine

## 2016-10-04 ENCOUNTER — Other Ambulatory Visit: Payer: Medicare Other

## 2016-10-05 ENCOUNTER — Other Ambulatory Visit: Payer: Self-pay | Admitting: Internal Medicine

## 2016-10-08 ENCOUNTER — Ambulatory Visit (INDEPENDENT_AMBULATORY_CARE_PROVIDER_SITE_OTHER): Payer: Medicare Other | Admitting: Internal Medicine

## 2016-10-08 ENCOUNTER — Encounter: Payer: Self-pay | Admitting: Internal Medicine

## 2016-10-08 VITALS — BP 148/90 | HR 83 | Temp 98.9°F | Ht 70.0 in | Wt 226.0 lb

## 2016-10-08 DIAGNOSIS — R05 Cough: Secondary | ICD-10-CM | POA: Diagnosis not present

## 2016-10-08 DIAGNOSIS — I10 Essential (primary) hypertension: Secondary | ICD-10-CM

## 2016-10-08 DIAGNOSIS — Z1159 Encounter for screening for other viral diseases: Secondary | ICD-10-CM

## 2016-10-08 DIAGNOSIS — R700 Elevated erythrocyte sedimentation rate: Secondary | ICD-10-CM | POA: Diagnosis not present

## 2016-10-08 DIAGNOSIS — G894 Chronic pain syndrome: Secondary | ICD-10-CM

## 2016-10-08 DIAGNOSIS — Z9189 Other specified personal risk factors, not elsewhere classified: Secondary | ICD-10-CM

## 2016-10-08 DIAGNOSIS — M25511 Pain in right shoulder: Secondary | ICD-10-CM

## 2016-10-08 DIAGNOSIS — M25512 Pain in left shoulder: Secondary | ICD-10-CM | POA: Diagnosis not present

## 2016-10-08 DIAGNOSIS — R059 Cough, unspecified: Secondary | ICD-10-CM

## 2016-10-08 MED ORDER — HYDROCODONE-ACETAMINOPHEN 7.5-325 MG/15ML PO SOLN: ORAL | 0 refills | Status: DC

## 2016-10-08 NOTE — Progress Notes (Signed)
Location:  [facility] clinic Provider: Cordaro Mukai L. Mariea Clonts, D.O., C.M.D.  Code Status: full code Goals of Care:  Advanced Directives 09/12/2016  Does Patient Have a Medical Advance Directive? -  Type of Paramedic of [place];Living will  Does patient want to make changes to medical advance directive? -  Copy of [place] in Chart? No - copy requested  Would patient like information on creating a medical advance directive? -   Chief Complaint  Patient presents with  . Acute Visit    Pain on left side (rib cage area) x 2-3 months. Bilateral shoulder pain x 2.5 month. + fall risk  . Health Maintenance    Hep C screening- patient with multiple blood transfusions in the 70'S.   . Medication Refill    Renew Cough medication     HPI: Patient is a 66 y.o. male seen today for an acute visit for bilateral shoulder pain.   C/o bilateral shoulder pain x 2 months. Hurts to lift his arms up. Pain radiates down both arms to mid elbow. Pain is worse when he goes to sleep and wakes up. It is difficult for himself to get out of bed. He does not have a regular sleep cycle, and pain wakes him up. The pain is in his whole body. He wouldn't say the pain is better, but he is tolerating it better. He does have some bad discs in his neck, but he doesn't think this is related. Right shoulder pain is a little worse.  Also, c/o pain under left rib x 2 months. He has been eating and drinking good. Drinks a lot of water. Has constipation x 40 years, due to OIC. No diarrhea. Pain is worse when taking a deep breath, feels it is stabbing him. He did fall 2 months ago, landed on his buttocks. No falls where he landed on his left side. He does have a cough, he has bronchitis and had PNA prior. He is taking hycet for cough.   He has mitral valve prolapse and had been on medication for it.  When he got a divorce, the pain in his chest from it went away.    He's been taking prednisone  from Dr. Nyoka Cowden and says it makes him feel bad.  He's gained 26 lbs.  He was down to 180 lbs.  He still has some tablets left.  He says it might be a little better in terms of pain with the prednisone, but he does not want to take it.    He says he has degenerative disc disease--had his first back surgery at 66 yo.  He had been in good shape in prep school.  His left leg gave out on him suddenly.  His uncle was a doctor here.  He ruptured a second disc a couple of years later.  Reports having had a bunch of back surgeries (in the teens) at [place] and [place].  During one of his surgeries, he was left with numbness from the waist down after the dye was left in him from a myelogram.  He developed adhesive arachnoiditis.    He's been coming here for 40 years.   Reports having had poor arterial circulation and some gangrene of his toes.    Talks about his wife a lot and being divorced.    Past Medical History:  Diagnosis Date  . 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 Anemia   . Arthritis   . Chronic pain   .  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 DVT (deep venous thrombosis) ([place])   . Heart murmur   . Hiatal hernia   . Hypothyroidism 12/14/2014  . Impotence   . Other and unspecified hyperlipidemia   . Pancreatitis, chronic ([place]) 12/14/2014  . Peripheral vascular disease ([place])   . Reflux   . Testosterone insufficiency 2013  . Ulcer ([place])    Peptic disease  . Unspecified essential hypertension     Past Surgical History:  Procedure Laterality Date  . CARPAL TUNNEL RELEASE Right 1995  . KIDNEY STONE SURGERY    . SPINE SURGERY      Allergies  Allergen Reactions  . Codeine   . Demerol   . Disalcid [Salsalate]   . Feldene [Piroxicam]   . Penicillins     Has patient had a PCN reaction causing immediate rash, facial/tongue/throat swelling, SOB or lightheadedness with hypotension: NO Has patient had a PCN reaction causing severe rash involving mucus  membranes or skin necrosis: NO Has patient had a PCN reaction that required hospitalization NO Has patient had a PCN reaction occurring within the last 10 years: No If all of the above answers are "NO", then may proceed with Cephalosporin use.  . Sulfa Antibiotics     Allergies as of 10/08/2016      Reactions   Codeine    Demerol    Disalcid [salsalate]    Feldene [piroxicam]    Penicillins    Has patient had a PCN reaction causing immediate rash, facial/tongue/throat swelling, SOB or lightheadedness with hypotension: NO Has patient had a PCN reaction causing severe rash involving mucus membranes or skin necrosis: NO Has patient had a PCN reaction that required hospitalization NO Has patient had a PCN reaction occurring within the last 10 years: No If all of the above answers are "NO", then may proceed with Cephalosporin use.   Sulfa Antibiotics       Medication List       Accurate as of 10/08/16  3:26 PM. Always use your most recent med list.          baclofen 10 MG tablet Commonly known as:  LIORESAL TAKE 1 TABLET THREE TIMES DAILY AS NEEDED FOR MUSCLE RELAXATION.   DENTA 5000 PLUS 1.1 % Crea dental cream Generic drug:  sodium fluoride BRUSH ON TEETH FOR 2 MINUTES THEN SWISH AND EXPECTORATE.   diazepam 10 MG tablet Commonly known as:  VALIUM TAKE 1 TABLET UP TO FOUR TIMES DAILY TO RELAX MUSCLES.   gabapentin 300 MG capsule Commonly known as:  NEURONTIN Take 1 capsule (300 mg total) by mouth 2 (two) times daily.   hydrochlorothiazide 25 MG tablet Commonly known as:  HYDRODIURIL One daily to help control BP and to reduce edema   HYDROcodone-acetaminophen 7.5-325 mg/15 ml solution Commonly known as:  HYCET One tsp up to 4 times in 24 hours to help control cough   HYDROcodone-acetaminophen 7.5-325 MG tablet Commonly known as:  NORCO Take one tablet every 6 hour if needed for pain   HYDROmorphone 8 MG tablet Commonly known as:  DILAUDID Take one tablet every 3  hours to control pain   levothyroxine 50 MCG tablet Commonly known as:  SYNTHROID, LEVOTHROID TAKE 1 TABLET EACH DAY FOR THYROID.   losartan 50 MG tablet Commonly known as:  COZAAR One daily to control BP   methadone 10 MG tablet Commonly known as:  DOLOPHINE Take four tablets every 6 hours to control pain  minoxidil 10 MG tablet Commonly known as:  LONITEN TAKE 1 TABLET DAILY TO CONTROL BLOOD PRESSURE.   NITRO-BID 2 % ointment Generic drug:  nitroGLYCERIN APPLY 1 INCH STRIP TO FEET TWICE DAILY.   omeprazole 20 MG capsule Commonly known as:  PRILOSEC TAKE (2) CAPSULES TWICE DAILY.   Oxycodone HCl 10 MG Tabs Take four tablets by mouth every 6 hours as needed for pain   predniSONE 5 MG tablet Commonly known as:  DELTASONE Take 4 tablets by mouth each morning.   prochlorperazine 10 MG tablet Commonly known as:  COMPAZINE TAKE 1 TABLET EVERY 8 HOURS AS NEEDED FOR NAUSEA.   temazepam 30 MG capsule Commonly known as:  RESTORIL TAKE 1 CAPSULE ONCE DAILY AT BEDTIME AS NEEDED FOR REST.   traMADol 50 MG tablet Commonly known as:  ULTRAM TAKE 1 TABLET UP TO FOUR TIMES DAILY AS NEEDED FOR PAIN.   VOLTAREN 1 % Gel Generic drug:  diclofenac sodium APPLY 2-4GM TO AFFECTED AREA UP TO 4 TIMES A DAY       Review of Systems:  Review of Systems  Constitutional: Negative for chills and fever.  HENT: Negative for congestion.   Eyes: Negative for blurred vision.  Respiratory: Negative for cough and shortness of breath.        Pt reports cough, but none heard  Cardiovascular: Negative for chest pain, palpitations and leg swelling.  Gastrointestinal: Negative for abdominal pain.  Musculoskeletal: Positive for back pain, joint pain, myalgias and neck pain. Negative for falls.       Walks with cane  Skin: Negative for itching and rash.  Neurological: Positive for tingling and sensory change.  Psychiatric/Behavioral: Positive for depression. Negative for memory loss. The patient  is nervous/anxious.     Health Maintenance  Topic Date Due  . Hepatitis C Screening  1951-07-15  . HIV Screening  04/03/1967  . FOOT EXAM  12/14/2015  . HEMOGLOBIN A1C  06/14/2016  . OPHTHALMOLOGY EXAM  07/16/2018 (Originally 04/02/1962)  . COLONOSCOPY  07/16/2018 (Originally 04/02/2002)  . TETANUS/TDAP  07/16/2018 (Originally 04/03/1971)  . INFLUENZA VACCINE  Completed    Physical Exam: Vitals:   10/08/16 1519  BP: (!) 148/90  Pulse: 83  Temp: 98.9 F (37.2 C)  TempSrc: Oral  SpO2: 97%  Weight: 226 lb (102.5 kg)  Height: '5\' 10"'$  (1.778 m)   Body mass index is 32.43 kg/m. Physical Exam  Constitutional: He is oriented to person, place, and time. He appears well-developed and well-nourished. No distress.  Cardiovascular: Normal rate, regular rhythm, normal heart sounds and intact distal pulses.   Pulmonary/Chest: Effort normal and breath sounds normal. No respiratory distress.  Musculoskeletal: He exhibits tenderness.  Cannot elevate shoulders above 90 degrees due to pain, winces when I shake his hand and he has to reach for a piece of paper  Neurological: He is alert and oriented to person, place, and time.  Skin: There is pallor.  Psychiatric: He has a normal mood and affect.  Talks about his divorce from his ex wife quite a bit and his many surgeries    Labs reviewed: Basic Metabolic Panel:  Recent Labs  12/14/15 1614 05/14/16 1216 09/24/16 0822 09/28/16 1007  NA 138  --  134* 138  K 4.4  --  5.9* 5.0  CL 98  --  97* 103  CO2 23  --  27 27  GLUCOSE 103*  --  121* 104*  BUN 16  --  32* 22  CREATININE 1.16 1.18  1.63* 1.09  CALCIUM 9.9  --  9.4 9.1  TSH 1.320  --   --   --    Liver Function Tests:  Recent Labs  12/14/15 1614  AST 21  ALT 15  ALKPHOS 93  BILITOT 0.2  PROT 7.0  ALBUMIN 4.7    Recent Labs  09/12/16 1455  LIPASE 36  AMYLASE 57   No results for input(s): AMMONIA in the last 8760 hours. CBC:  Recent Labs  12/14/15 1614  WBC 6.3   NEUTROABS 5.1  HCT 38.4  MCV 92  PLT 303   Lipid Panel:  Recent Labs  12/14/15 1614  CHOL 189  HDL 47  LDLCALC 99  TRIG 215*  CHOLHDL 4.0   Lab Results  Component Value Date   HGBA1C 5.8 (H) 12/14/2015    Procedures since last visit: Dg Pelvis 1-2 Views  Result Date: 09/13/2016 CLINICAL DATA:  Chronic right leg pain. EXAM: PELVIS - 1-2 VIEW COMPARISON:  None. FINDINGS: There is no evidence of pelvic fracture or diastasis. No pelvic bone lesions are seen. IMPRESSION: Normal pelvis. Electronically Signed   By: Marijo Conception, M.D.   On: 09/13/2016 15:49   Mr Tibia Fibula Right W Wo Contrast  Result Date: 09/20/2016 CLINICAL DATA:  Mixed lytic and sclerotic lesion in the right tibia seen on recent plain films. History of fracture with surgical fixation in 1986. Creatinine was obtained on site at [facility] at [address] Results: Creatinine 1.3 mg/dL. EXAM: MRI OF LOWER RIGHT EXTREMITY WITHOUT AND WITH CONTRAST TECHNIQUE: Multiplanar, multisequence MR imaging of the right lower leg was performed both before and after administration of intravenous contrast. CONTRAST:  20 cc MultiHance IV. COMPARISON:  Plain films of the right knee 09/14/2014. FINDINGS: Bones/Joint/Cartilage No worrisome lesion is identified. Mixed lytic and sclerotic lesion in the right lower leg seen on the prior examination is due to remote fracture and prior surgery. Scattered foci of susceptibility artifact are seen in this region consistent with the postoperative change. No acute bony or joint abnormality or worrisome marrow lesion is identified. Ligaments Intact. Muscles and Tendons Intact and normal in appearance. Soft tissues Appear normal. IMPRESSION: No acute abnormality or worrisome finding. Appearance on plain films is due to prior fracture and postoperative change in the right tibia. No follow-up imaging is recommended. Electronically Signed   By: Inge Rise M.D.   On: 09/20/2016 13:37    Dg Knee Complete 4 Views Right  Result Date: 09/13/2016 CLINICAL DATA:  Chronic right leg pain. EXAM: RIGHT KNEE - COMPLETE 4+ VIEW COMPARISON:  None. FINDINGS: No definite fracture or dislocation is noted. Vascular calcifications are noted joint spaces are intact. There is a large mixed sclerotic and lucent lesion in the proximal tibia concerning for possible neoplasm or malignancy. No joint effusion is noted. IMPRESSION: Large mixed sclerotic and lucent lesion seen in proximal tibia concerning for possible neoplasm or malignancy. MRI is recommended for further evaluation. These results will be called to the ordering clinician or representative by the Radiologist Assistant, and communication documented in the PACS or zVision Dashboard. Electronically Signed   By: Marijo Conception, M.D.   On: 09/13/2016 15:48   Dg Femur, Min 2 Views Right  Result Date: 09/13/2016 CLINICAL DATA:  Chronic right leg pain. EXAM: RIGHT FEMUR 2 VIEWS COMPARISON:  None. FINDINGS: There is no evidence of fracture or other focal bone lesions. Soft tissues are unremarkable. IMPRESSION: Normal right femur. Electronically Signed   By:  Marijo Conception, M.D.   On: 09/13/2016 15:43    Assessment/Plan 1. Acute pain of both shoulders - sounds like PMR, but pt reports very little improvement with prednisone use only side effects -check ESR and stop prednisone - Sedimentation rate  2. Sedimentation rate elevation - f/u ESR which was 66 and CRP was also high - Sedimentation rate  3. Chronic pain syndrome - ongoing, has had a huge number of surgeries and also has adhesive arachnoiditis since one of his myelograms  - Sedimentation rate  4. Essential hypertension -bp elevated today with prednisone therapy and seeing me for the first time, will monitor  5.Encounter for hepatitis C virus screening test for high risk patient -reports multiple transfusions when young putting him at high risk - Hepatitis C antibody  6. Cough -  ongoing, pt has "chronic bronchitis"--will need to evaluate this more thoroughly as I get to know him, but continue his medication today until I can evaluate him - HYDROcodone-acetaminophen (HYCET) 7.5-325 mg/15 ml solution; One tsp up to 4 times in 24 hours to help control cough  Dispense: 480 mL; Refill: 0  Labs/tests ordered:   Orders Placed This Encounter  Procedures  . Sedimentation rate  . Hepatitis C antibody    Next appt:  10/23/2016  Mar Walmer L. Videl Nobrega, D.O. [facility] [address], [place] [address] Cell Phone (Mon-Fri 8am-5pm):  [phone] On Call:  [phone] & follow prompts after 5pm & weekends Office Phone:  [phone] Office Fax:  [phone]

## 2016-10-09 ENCOUNTER — Other Ambulatory Visit: Payer: Self-pay | Admitting: Internal Medicine

## 2016-10-09 LAB — SEDIMENTATION RATE: Sed Rate: 30 mm/hr — ABNORMAL HIGH (ref 0–20)

## 2016-10-09 LAB — HEPATITIS C ANTIBODY: HCV Ab: NEGATIVE

## 2016-10-15 ENCOUNTER — Other Ambulatory Visit: Payer: Self-pay | Admitting: Internal Medicine

## 2016-10-16 ENCOUNTER — Other Ambulatory Visit: Payer: Self-pay | Admitting: Internal Medicine

## 2016-10-16 NOTE — Telephone Encounter (Signed)
A refill request was received from the pharmacy for androgel pump 20.25 mg/act. Rx called in to pharmacy.

## 2016-10-17 NOTE — Telephone Encounter (Signed)
Dr.Green please advise on patient's request for as needed medication Compazine 10 mg tablet, it appears patient is taking scheduled every 8 hours as needed (three times daily) # 90 given on 09/17/16

## 2016-10-19 ENCOUNTER — Other Ambulatory Visit: Payer: Self-pay

## 2016-10-19 DIAGNOSIS — G894 Chronic pain syndrome: Secondary | ICD-10-CM

## 2016-10-19 MED ORDER — HYDROMORPHONE HCL 8 MG PO TABS: ORAL_TABLET | ORAL | 0 refills | Status: DC

## 2016-10-19 NOTE — Telephone Encounter (Signed)
Patient called and requested.

## 2016-10-23 ENCOUNTER — Ambulatory Visit (INDEPENDENT_AMBULATORY_CARE_PROVIDER_SITE_OTHER): Payer: Medicare Other | Admitting: Internal Medicine

## 2016-10-23 ENCOUNTER — Encounter: Payer: Self-pay | Admitting: Internal Medicine

## 2016-10-23 ENCOUNTER — Other Ambulatory Visit: Payer: Self-pay | Admitting: Internal Medicine

## 2016-10-23 VITALS — BP 204/106 | HR 83 | Temp 98.2°F | Ht 70.0 in | Wt 221.0 lb

## 2016-10-23 DIAGNOSIS — I10 Essential (primary) hypertension: Secondary | ICD-10-CM | POA: Diagnosis not present

## 2016-10-23 DIAGNOSIS — M25511 Pain in right shoulder: Secondary | ICD-10-CM | POA: Diagnosis not present

## 2016-10-23 DIAGNOSIS — R05 Cough: Secondary | ICD-10-CM

## 2016-10-23 DIAGNOSIS — M25512 Pain in left shoulder: Secondary | ICD-10-CM | POA: Diagnosis not present

## 2016-10-23 DIAGNOSIS — R059 Cough, unspecified: Secondary | ICD-10-CM

## 2016-10-23 NOTE — Progress Notes (Signed)
Facility  [facility]    Place of Service:   OFFICE    Allergies  Allergen Reactions  . Codeine   . Demerol   . Disalcid [Salsalate]   . Feldene [Piroxicam]   . Penicillins     Has patient had a PCN reaction causing immediate rash, facial/tongue/throat swelling, SOB or lightheadedness with hypotension: NO Has patient had a PCN reaction causing severe rash involving mucus membranes or skin necrosis: NO Has patient had a PCN reaction that required hospitalization NO Has patient had a PCN reaction occurring within the last 10 years: No If all of the above answers are "NO", then may proceed with Cephalosporin use.  [facility] Antibiotics     Chief Complaint  Patient presents with  . Acute Visit    can't move shoulders/arms both, right arm shakes when he tries to raise his arm, left arm not as bad.     HPI:   Increasing difficulty with movement of th shoulders. He is able to move the arms up and rotate the shoulders with mild discomfort that is worse on the right side. No known injury or fall. He was seen by Dr. Mariea Clonts for this on 10/08/16. She thought it sounded like PMR, and he does have an elevated ESR, but he had been on prednisone and did not find it helpful in relieving the pain.  Had MR of the cervical spine 05/14/16: No likely significant finding in the cervical spine. 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  BP is high again. He says he is taking his medication.   He again pleads for increase in his pain medication. 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 I told him that I am not going to give him more pain medication until he sees a pain specialist.   Medications: Patient's Medications  New Prescriptions   No medications on file  Previous Medications   ANDROGEL PUMP 20.25 MG/ACT (1.62%) GEL    APPLY 2 PUMP ACTUATIONS  DAILY FOR TESTOSTERONE REPLACEMENT.   BACLOFEN (LIORESAL) 10 MG TABLET    TAKE 1 TABLET THREE TIMES DAILY AS NEEDED FOR MUSCLE RELAXATION.   DENTA 5000 PLUS 1.1 % CREA DENTAL CREAM    BRUSH ON TEETH FOR 2 MINUTES THEN SWISH AND EXPECTORATE.   DIAZEPAM (VALIUM) 10 MG TABLET    TAKE 1 TABLET UP TO FOUR TIMES DAILY TO RELAX MUSCLES.   GABAPENTIN (NEURONTIN) 300 MG CAPSULE    Take 1 capsule (300 mg total) by mouth 2 (two) times daily.   HYDROCHLOROTHIAZIDE (HYDRODIURIL) 25 MG TABLET    One daily to help control BP and to reduce edema   HYDROCODONE-ACETAMINOPHEN (HYCET) 7.5-325 MG/15 ML SOLUTION    One tsp up to 4 times in 24 hours to help control cough   HYDROCODONE-ACETAMINOPHEN (NORCO) 7.5-325 MG TABLET    Take one tablet every 6 hour if needed for pain   HYDROMORPHONE (DILAUDID) 8 MG TABLET    Take one tablet every 3 hours to control pain   LEVOTHYROXINE (SYNTHROID, LEVOTHROID) 50 MCG TABLET    TAKE 1 TABLET EACH DAY FOR THYROID.   LOSARTAN (COZAAR) 50 MG TABLET    One daily to control BP   METHADONE (DOLOPHINE) 10 MG TABLET    Take four tablets every 6 hours to control pain   MINOXIDIL (LONITEN) 10 MG TABLET    TAKE 1 TABLET  DAILY TO CONTROL BLOOD PRESSURE.   NITRO-BID 2 % OINTMENT    APPLY 1 INCH STRIP TO FEET TWICE DAILY.   OMEPRAZOLE (PRILOSEC) 20 MG CAPSULE    TAKE (2) CAPSULES TWICE DAILY.   OXYCODONE HCL 10 MG TABS    Take four tablets by mouth every 6 hours as needed for pain   PREDNISONE (DELTASONE) 5 MG TABLET    TAKE 4 TABLETS EVERY AM.   PROCHLORPERAZINE (COMPAZINE) 10 MG TABLET    TAKE 1 TABLET EVERY 8 HOURS AS NEEDED FOR NAUSEA.   TEMAZEPAM (RESTORIL) 30 MG CAPSULE    TAKE 1 CAPSULE ONCE DAILY AT BEDTIME AS NEEDED FOR REST.   TRAMADOL (ULTRAM) 50 MG TABLET    TAKE 1 TABLET UP TO FOUR TIMES DAILY AS NEEDED FOR PAIN.   VOLTAREN 1 % GEL    APPLY 2-4GM TO AFFECTED AREA UP TO 4 TIMES A DAY  Modified Medications   No medications on file  Discontinued Medications   No medications on  file    Review of Systems  Constitutional: Positive for activity change and unexpected weight change (25# loss since Dec 2016 weight of 205#). Negative for appetite change.       Chronically ill  HENT: Negative for congestion and rhinorrhea.   Eyes: Positive for visual disturbance.  Respiratory: Positive for cough, chest tightness and shortness of breath.        Producing sputum  Cardiovascular: Positive for leg swelling. Negative for chest pain and palpitations.       History of peripheral artery disease.  Gastrointestinal: Positive for diarrhea.       History of chronic and recurrent pancreatitis. Loose stools from time to time. Upper GI distress and epigastric pain.  Endocrine: Positive for polydipsia.       History of diabetes mellitus.  Musculoskeletal: Positive for arthralgias, back pain, gait problem, joint swelling (Right knee), myalgias, neck pain and neck stiffness.       Pain in both hips, knees and shoulders.  Skin: Negative.   Allergic/Immunologic: Negative.   Neurological: Positive for dizziness, tremors, weakness and numbness.  Hematological:       History of anemia.  Psychiatric/Behavioral: Positive for decreased concentration and dysphoric mood. Negative for agitation, behavioral problems, confusion, hallucinations and suicidal ideas. The patient is nervous/anxious.     Vitals:   10/23/16 1600  BP: (!) 204/106  Pulse: 83  Temp: 98.2 F (36.8 C)  TempSrc: Oral  SpO2: 97%  Weight: 221 lb (100.2 kg)  Height: 5' 10"  (1.778 m)   Body mass index is 31.71 kg/m. Wt Readings from Last 3 Encounters:  10/23/16 221 lb (100.2 kg)  10/08/16 226 lb (102.5 kg)  09/12/16 222 lb (100.7 kg)      Physical Exam  Constitutional: He is oriented to person, place, and time.  Mesomorphic. Rambling in thought content. Chronically ill appearance.  HENT:  Head: Normocephalic and atraumatic.  Right Ear: External ear normal.  Left Ear: External ear normal.  Nose: Nose normal.    Mouth/Throat: Oropharynx is clear and moist.  Eyes: Conjunctivae and EOM are normal. Pupils are equal, round, and reactive to light.  Neck: No JVD present. No tracheal deviation present. No thyromegaly present.  Cardiovascular: Normal rate, regular rhythm and normal heart sounds.  Exam reveals no gallop and no friction rub.   No murmur heard. Pulmonary/Chest: No respiratory distress. He has no wheezes. He has no rales.  Abdominal: He exhibits no distension and no mass. There is  no tenderness.  Musculoskeletal: He exhibits edema and tenderness.  Muscular weakness and pains. Unstable gait. Uses walker. Velcro brace on the right knee. Ganglion cyst left wrist dorsum. Exquisitely tender to palpation at the left elbow malleolus. Some pain in both shoulders at rest and on attempts to raise arms and rotate shoulders.  Lymphadenopathy:    He has no cervical adenopathy.  Neurological: He is alert and oriented to person, place, and time. He displays abnormal reflex. No cranial nerve deficit. Coordination abnormal.  tremor  Skin: No rash noted. No erythema. There is pallor.  Psychiatric:  Rambling speech. Frustrated.  Vitals reviewed.   Labs reviewed: Lab Summary Latest Ref Rng & Units 09/28/2016 09/24/2016  Hemoglobin - (None) (None)  Hematocrit - (None) (None)  White count - (None) (None)  Platelet count - (None) (None)  Sodium 135 - 146 mmol/L 138 134(L)  Potassium 3.5 - 5.3 mmol/L 5.0 5.9(H)  Calcium 8.6 - 10.3 mg/dL 9.1 9.4  Phosphorus - (None) (None)  Creatinine 0.70 - 1.25 mg/dL 1.09 1.63(H)  AST - (None) (None)  Alk Phos - (None) (None)  Bilirubin - (None) (None)  Glucose 65 - 99 mg/dL 104(H) 121(H)  Cholesterol - (None) (None)  HDL cholesterol - (None) (None)  Triglycerides - (None) (None)  LDL Direct - (None) (None)  LDL Calc - (None) (None)  Total protein - (None) (None)  Albumin - (None) (None)  Some recent data might be hidden   Lab Results  Component Value Date    TSH 1.320 12/14/2015   TSH 3.890 12/14/2014   TSH 2.080 01/27/2014   Lab Results  Component Value Date   BUN 22 09/28/2016   BUN 32 (H) 09/24/2016   BUN 16 12/14/2015   Lab Results  Component Value Date   HGBA1C 5.8 (H) 12/14/2015   HGBA1C 5.8 (H) 12/14/2014   HGBA1C 6.0 (H) 01/27/2014    Assessment/Plan  1. Acute pain of both shoulders I offered to get him referrals to orthopedist for evaluation and pain specialist for evaluation. He refused both referrals at this time. He will continue on his usual pain medications.  2. Cough Try Bevespi 2 inhalations twice daily to help cough. Sample provided.  3. Essential hypertension Continue current meds. Call if BP remains >140 / >90

## 2016-10-25 ENCOUNTER — Telehealth: Payer: Self-pay

## 2016-10-25 ENCOUNTER — Encounter: Payer: Self-pay | Admitting: Internal Medicine

## 2016-10-25 ENCOUNTER — Other Ambulatory Visit: Payer: Self-pay | Admitting: Internal Medicine

## 2016-10-25 NOTE — Telephone Encounter (Signed)
Message left on clinical intake voicemail:   Patient was told he would be put back on SOMA at yesterday's visit. Dr.Green informed patient that he would draft a letter stating baclofen is not working and send new rx for SOMA to pharmacy.  Please advise

## 2016-10-25 NOTE — Telephone Encounter (Signed)
I drafted a letter and it was printed on printer #4 at the medial assistant desk. Place in my folders and I will sign it this evening.

## 2016-10-25 NOTE — Telephone Encounter (Signed)
Letter was placed in review and sign folder

## 2016-10-26 ENCOUNTER — Other Ambulatory Visit: Payer: Self-pay

## 2016-10-26 MED ORDER — METHADONE HCL 10 MG PO TABS: ORAL_TABLET | ORAL | 0 refills | Status: DC

## 2016-10-26 MED ORDER — OXYCODONE HCL 10 MG PO TABS: ORAL_TABLET | ORAL | 0 refills | Status: DC

## 2016-10-26 MED ORDER — HYDROCODONE-ACETAMINOPHEN 7.5-325 MG PO TABS: ORAL_TABLET | ORAL | 0 refills | Status: DC

## 2016-10-26 MED ORDER — TEMAZEPAM 30 MG PO CAPS: ORAL_CAPSULE | ORAL | 0 refills | Status: DC

## 2016-10-29 ENCOUNTER — Other Ambulatory Visit: Payer: Self-pay | Admitting: Internal Medicine

## 2016-10-30 DIAGNOSIS — M25519 Pain in unspecified shoulder: Secondary | ICD-10-CM | POA: Insufficient documentation

## 2016-10-31 ENCOUNTER — Other Ambulatory Visit: Payer: Self-pay | Admitting: Internal Medicine

## 2016-10-31 ENCOUNTER — Encounter: Payer: Self-pay | Admitting: Internal Medicine

## 2016-10-31 DIAGNOSIS — M62830 Muscle spasm of back: Secondary | ICD-10-CM

## 2016-10-31 MED ORDER — CARISOPRODOL 350 MG PO TABS: 350.0000 mg | ORAL_TABLET | Freq: Four times a day (QID) | ORAL | 3 refills | Status: DC | PRN

## 2016-11-01 ENCOUNTER — Other Ambulatory Visit: Payer: Self-pay | Admitting: Internal Medicine

## 2016-11-01 ENCOUNTER — Telehealth: Payer: Self-pay

## 2016-11-01 NOTE — Telephone Encounter (Signed)
Called patient to let him know that he has a prescription ready to be picked up at office. Rx is for soma 350 mg #120 with 3 RF.   Rx was placed in filing cabinet at front desk.

## 2016-11-02 ENCOUNTER — Other Ambulatory Visit: Payer: Self-pay | Admitting: Internal Medicine

## 2016-11-02 NOTE — Telephone Encounter (Signed)
Spoke with patient, patient states he is not taking SOMA right now because insurance will not cover and he will further discuss with Dr.Green at another time. Patient is taking diazepam for muscle spasms

## 2016-11-02 NOTE — Telephone Encounter (Signed)
Should pt be on this in combination with all the other pain meds? Please advise on refill?

## 2016-11-02 NOTE — Telephone Encounter (Signed)
Patient requesting diazepam for muscle spasms and also on zofran? Please advise

## 2016-11-03 NOTE — Telephone Encounter (Signed)
He had asked me to try to get the [facility] refilled. I am aware that his insurance previously rejected this medication. Since he has not had the expected relief from the baclofen, we will appeal the insurance company's decision if they refuse him again. They have a formulary and have elected to exclude Soma in the past, but sometimes when there is a therapeutic failure of the approved drugs on the formulary, they will approve a substitute.   He should also continue the Diazepam for muscular spasm and the Zofran for nausea.

## 2016-11-03 NOTE — Telephone Encounter (Signed)
Austin Barnes has been on his current combination of medications for a long time. Please refill.

## 2016-11-05 NOTE — Telephone Encounter (Signed)
Rx was called into pharmacy on 11/02/16. I called pharmacy to verify that medications were refilled and was told that tramadol and diazepam have been filled.   I asked if patient had filled soma and pharmacist stated that he had not presented a prescription for soma as of yet.   That prescription was written and signed on 4/18 but patient has not picked up that Rx.

## 2016-11-05 NOTE — Telephone Encounter (Signed)
Prescription was phoned in to pharmacy on 11/02/16. I called pharmacy to verify that Rx was received and they said that it was.

## 2016-11-06 ENCOUNTER — Telehealth: Payer: Self-pay | Admitting: Internal Medicine

## 2016-11-06 NOTE — Telephone Encounter (Signed)
Patient called requesting refill on cough medication- Hydrocodone (Hycet) 7.5-325mg /9ml.   Patient is not due for a refill until tomorrow 11/07/16.  Called patient to let him know that refill is not due until tomorrow 11/07/16 and that someone would call him when prescription is ready for pick up.   He understood and had no questions.

## 2016-11-07 ENCOUNTER — Other Ambulatory Visit: Payer: Self-pay | Admitting: *Deleted

## 2016-11-07 DIAGNOSIS — R059 Cough, unspecified: Secondary | ICD-10-CM

## 2016-11-07 DIAGNOSIS — R05 Cough: Secondary | ICD-10-CM

## 2016-11-07 MED ORDER — HYDROCODONE-ACETAMINOPHEN 7.5-325 MG/15ML PO SOLN: ORAL | 0 refills | Status: DC

## 2016-11-07 NOTE — Telephone Encounter (Signed)
Patient requested and will pick up 

## 2016-11-09 ENCOUNTER — Telehealth: Payer: Self-pay | Admitting: *Deleted

## 2016-11-09 NOTE — Telephone Encounter (Signed)
Patient called and stated that he is 10x's worse than he was when he last saw you. Stated he can't even get up out of bed. Stated that you know his symptoms. Just stated that he can barely walk and cannot get out of bed. Patient stated that he is going to call 911 and have EMS come out to evaluate his condition. Agreed.

## 2016-11-14 NOTE — Telephone Encounter (Signed)
Patient called back and stated that he called 911 but they were going to charge him so he cancelled it. Stated that he couldn't afford to call them. Patient stated that he cannot hardly get out of bed and can barely walk. He wanted to make you aware.

## 2016-11-15 ENCOUNTER — Other Ambulatory Visit: Payer: Self-pay

## 2016-11-15 DIAGNOSIS — G894 Chronic pain syndrome: Secondary | ICD-10-CM

## 2016-11-15 MED ORDER — HYDROMORPHONE HCL 8 MG PO TABS: ORAL_TABLET | ORAL | 0 refills | Status: DC

## 2016-11-15 NOTE — Telephone Encounter (Signed)
noted 

## 2016-11-15 NOTE — Telephone Encounter (Signed)
Left message for patient to return call to office to see if patient would be interested in in the pain management clinic. Per Dr. Chilton SiGreen

## 2016-11-15 NOTE — Telephone Encounter (Signed)
We can try to get him an appointment at a pain management center. It is OK to refill his current medication if the refill is "on time".

## 2016-11-15 NOTE — Telephone Encounter (Signed)
FYI Patient refused pain management, he stated that its the same pain Dr. Chilton SiGreen seen him for at his last visit, that the pain starts in his leg and radiates down his leg.

## 2016-11-15 NOTE — Telephone Encounter (Signed)
Patient called office to request medication. Patient also stated that his pain is 10 times worse then the last time he saw Dr. Chilton SiGreen.

## 2016-11-22 ENCOUNTER — Telehealth: Payer: Self-pay

## 2016-11-22 NOTE — Telephone Encounter (Signed)
Would recommend evaluation, to go to urgent care for this

## 2016-11-22 NOTE — Telephone Encounter (Signed)
Patient states he is limited to mobility in his left are as a result of a fall in January or February. Patient states he thinks his arm is broke or fractured. Patient states he has seen Dr.Reed and Dr.Green about this left arm concern. Patient denied chest pain, yet states it feels like someone is sitting on his chest and it has been this was for months.  Patient is requesting an order for a xray of his left arm.  No available appointments this week. Please advise

## 2016-11-22 NOTE — Telephone Encounter (Signed)
Patient refused urgent care at this time he stated that he would just take care of it himself.

## 2016-11-22 NOTE — Telephone Encounter (Signed)
Please keep a look out for opening and maybe we can get him in tomorrow or Monday with Dr Renato Gailseed

## 2016-11-23 ENCOUNTER — Other Ambulatory Visit: Payer: Self-pay | Admitting: Internal Medicine

## 2016-11-23 ENCOUNTER — Other Ambulatory Visit: Payer: Self-pay

## 2016-11-23 ENCOUNTER — Telehealth: Payer: Self-pay

## 2016-11-23 DIAGNOSIS — M25511 Pain in right shoulder: Principal | ICD-10-CM

## 2016-11-23 DIAGNOSIS — M25512 Pain in left shoulder: Principal | ICD-10-CM

## 2016-11-23 DIAGNOSIS — G8929 Other chronic pain: Secondary | ICD-10-CM

## 2016-11-23 MED ORDER — HYDROCODONE-ACETAMINOPHEN 7.5-325 MG PO TABS: ORAL_TABLET | ORAL | 0 refills | Status: DC

## 2016-11-23 MED ORDER — TEMAZEPAM 30 MG PO CAPS: ORAL_CAPSULE | ORAL | 0 refills | Status: DC

## 2016-11-23 MED ORDER — METHADONE HCL 10 MG PO TABS: ORAL_TABLET | ORAL | 0 refills | Status: DC

## 2016-11-23 MED ORDER — BACLOFEN 10 MG PO TABS: ORAL_TABLET | ORAL | 0 refills | Status: DC

## 2016-11-23 MED ORDER — OXYCODONE HCL 10 MG PO TABS: ORAL_TABLET | ORAL | 0 refills | Status: DC

## 2016-11-23 NOTE — Telephone Encounter (Signed)
He should remain on schedule for his refills. I cannot increase his load pf pain relievers. If he runs out early, I do not want to refill the medications early. It appears that his last refill was 11/15/16. If this is true, he should not receive refill until 30 days have passed. If he has not received this medication for 30+ days, he can have it refilled. Please get one of the providers covering me to refill if appropriate. you may need to call his pharmacy to clarify when they last dispensed it.

## 2016-11-23 NOTE — Telephone Encounter (Signed)
He should remain on schedule for his refills. I cannot increase his load pf pain relievers. If he runs out early, I do not want to refill the medications early. It appears that his last refill was 11/15/16. If this is true, he should not receive refill until 30 days have passed. If he has not received this medication for 30+ days, he can have it refilled. Please get one of the providers covering me to refill if appropriate. you may need to call his pharmacy to clarify when they last dispensed it. 

## 2016-11-23 NOTE — Telephone Encounter (Signed)
Patient called today  wanting x-rays of shoulders. In Dr. Thomasene LotGreen's notes he offered referral to Orthopedist for his shoulder problem. I can put the order for the Orthopedist, they will x-ray them at that time. He was ok with this.

## 2016-11-28 ENCOUNTER — Other Ambulatory Visit: Payer: Self-pay | Admitting: Internal Medicine

## 2016-11-29 ENCOUNTER — Telehealth: Payer: Self-pay

## 2016-11-29 NOTE — Telephone Encounter (Signed)
[facility] refill on hydrocodone 7.5-325 mg/2515mL solution, notation made from pharmacy 20 mL per day = 24 days supply.  Per Dr.Reed, no additional refills. Patient needs to be referred to Pulmonary for Pulmonary Function Test   Patient aware of response and refused referral at this time. Patient states he will get this medication when Dr.Green returns.   Patient aware that Dr.Green will retire on 12/27/16 and going forward if he would like additional treatment for chronic bronchitis he will need to see a lung specialist, for this controlled cough medication is not recommended for long-term use.   Patient states he has inhalers and they do not help much.  Pending appointment on 12/26/16 with Dr.Green  Message forwarded to Dr.Reed as a Lorain Childes(FYI), patient plans to switch to Dr.Reed when Dr.Green retires.

## 2016-11-30 ENCOUNTER — Other Ambulatory Visit: Payer: Self-pay | Admitting: Internal Medicine

## 2016-12-01 ENCOUNTER — Other Ambulatory Visit: Payer: Self-pay | Admitting: Internal Medicine

## 2016-12-03 ENCOUNTER — Other Ambulatory Visit: Payer: Self-pay | Admitting: *Deleted

## 2016-12-03 MED ORDER — DIAZEPAM 10 MG PO TABS: ORAL_TABLET | ORAL | 0 refills | Status: DC

## 2016-12-03 NOTE — Telephone Encounter (Signed)
[facility]  

## 2016-12-09 ENCOUNTER — Other Ambulatory Visit: Payer: Self-pay | Admitting: Internal Medicine

## 2016-12-11 ENCOUNTER — Other Ambulatory Visit: Payer: Self-pay

## 2016-12-11 MED ORDER — TESTOSTERONE 20.25 MG/ACT (1.62%) TD GEL: TRANSDERMAL | 0 refills | Status: DC

## 2016-12-11 NOTE — Telephone Encounter (Signed)
Re-print Rx first one with Dr. Thomasene LotGreen's name, not in office, reprint with Abbey ChattersJessica Eubanks, NP name

## 2016-12-12 ENCOUNTER — Ambulatory Visit (INDEPENDENT_AMBULATORY_CARE_PROVIDER_SITE_OTHER): Payer: Medicare Other | Admitting: Orthopedic Surgery

## 2016-12-14 ENCOUNTER — Other Ambulatory Visit: Payer: Self-pay | Admitting: *Deleted

## 2016-12-14 DIAGNOSIS — G894 Chronic pain syndrome: Secondary | ICD-10-CM

## 2016-12-14 MED ORDER — HYDROMORPHONE HCL 8 MG PO TABS: ORAL_TABLET | ORAL | 0 refills | Status: DC

## 2016-12-14 NOTE — Telephone Encounter (Signed)
Patient requested and will pick up 

## 2016-12-18 ENCOUNTER — Other Ambulatory Visit: Payer: Self-pay | Admitting: Internal Medicine

## 2016-12-18 ENCOUNTER — Telehealth: Payer: Self-pay | Admitting: *Deleted

## 2016-12-18 NOTE — Telephone Encounter (Signed)
Received letter from patient from [employer] stating medication Androgel needs prior authorization. Initiated through [employer]  [place] Case ID#: [id number] Key#: [id number] Information submitted to [employer] Part D. Decision in 1-3 days. (Favorable outcome).   Outcome states: APPROVED

## 2016-12-20 ENCOUNTER — Ambulatory Visit (INDEPENDENT_AMBULATORY_CARE_PROVIDER_SITE_OTHER): Payer: Medicare Other | Admitting: Orthopedic Surgery

## 2016-12-20 ENCOUNTER — Other Ambulatory Visit: Payer: Self-pay | Admitting: Internal Medicine

## 2016-12-21 ENCOUNTER — Other Ambulatory Visit: Payer: Self-pay | Admitting: Internal Medicine

## 2016-12-21 ENCOUNTER — Other Ambulatory Visit: Payer: Self-pay

## 2016-12-21 MED ORDER — HYDROCODONE-ACETAMINOPHEN 7.5-325 MG PO TABS: ORAL_TABLET | ORAL | 0 refills | Status: DC

## 2016-12-21 MED ORDER — METHADONE HCL 10 MG PO TABS: ORAL_TABLET | ORAL | 0 refills | Status: DC

## 2016-12-21 MED ORDER — OXYCODONE HCL 10 MG PO TABS: ORAL_TABLET | ORAL | 0 refills | Status: DC

## 2016-12-21 MED ORDER — TEMAZEPAM 30 MG PO CAPS: ORAL_CAPSULE | ORAL | 0 refills | Status: DC

## 2016-12-24 ENCOUNTER — Ambulatory Visit (INDEPENDENT_AMBULATORY_CARE_PROVIDER_SITE_OTHER): Payer: Medicare Other

## 2016-12-24 ENCOUNTER — Encounter (INDEPENDENT_AMBULATORY_CARE_PROVIDER_SITE_OTHER): Payer: Self-pay | Admitting: Orthopedic Surgery

## 2016-12-24 ENCOUNTER — Ambulatory Visit (INDEPENDENT_AMBULATORY_CARE_PROVIDER_SITE_OTHER): Payer: Medicare Other | Admitting: Orthopedic Surgery

## 2016-12-24 DIAGNOSIS — M25512 Pain in left shoulder: Secondary | ICD-10-CM

## 2016-12-24 DIAGNOSIS — M5412 Radiculopathy, cervical region: Secondary | ICD-10-CM | POA: Diagnosis not present

## 2016-12-24 DIAGNOSIS — M25511 Pain in right shoulder: Secondary | ICD-10-CM

## 2016-12-26 ENCOUNTER — Ambulatory Visit (INDEPENDENT_AMBULATORY_CARE_PROVIDER_SITE_OTHER): Payer: Medicare Other | Admitting: Internal Medicine

## 2016-12-26 ENCOUNTER — Encounter: Payer: Self-pay | Admitting: Internal Medicine

## 2016-12-26 VITALS — BP 156/84 | HR 73 | Temp 98.8°F | Ht 70.0 in | Wt 193.0 lb

## 2016-12-26 DIAGNOSIS — R531 Weakness: Secondary | ICD-10-CM

## 2016-12-26 DIAGNOSIS — H5350 Unspecified color vision deficiencies: Secondary | ICD-10-CM

## 2016-12-26 DIAGNOSIS — E039 Hypothyroidism, unspecified: Secondary | ICD-10-CM | POA: Diagnosis not present

## 2016-12-26 DIAGNOSIS — E349 Endocrine disorder, unspecified: Secondary | ICD-10-CM

## 2016-12-26 DIAGNOSIS — R059 Cough, unspecified: Secondary | ICD-10-CM

## 2016-12-26 DIAGNOSIS — E785 Hyperlipidemia, unspecified: Secondary | ICD-10-CM | POA: Diagnosis not present

## 2016-12-26 DIAGNOSIS — E1151 Type 2 diabetes mellitus with diabetic peripheral angiopathy without gangrene: Secondary | ICD-10-CM

## 2016-12-26 DIAGNOSIS — M25511 Pain in right shoulder: Secondary | ICD-10-CM

## 2016-12-26 DIAGNOSIS — M25512 Pain in left shoulder: Secondary | ICD-10-CM

## 2016-12-26 DIAGNOSIS — I10 Essential (primary) hypertension: Secondary | ICD-10-CM | POA: Diagnosis not present

## 2016-12-26 DIAGNOSIS — D649 Anemia, unspecified: Secondary | ICD-10-CM | POA: Diagnosis not present

## 2016-12-26 DIAGNOSIS — R05 Cough: Secondary | ICD-10-CM

## 2016-12-26 DIAGNOSIS — R634 Abnormal weight loss: Secondary | ICD-10-CM | POA: Diagnosis not present

## 2016-12-26 DIAGNOSIS — J209 Acute bronchitis, unspecified: Secondary | ICD-10-CM | POA: Diagnosis not present

## 2016-12-26 LAB — COMPREHENSIVE METABOLIC PANEL
ALBUMIN: 4 g/dL (ref 3.6–5.1)
ALT: 12 U/L (ref 9–46)
AST: 16 U/L (ref 10–35)
Alkaline Phosphatase: 86 U/L (ref 40–115)
BILIRUBIN TOTAL: 0.3 mg/dL (ref 0.2–1.2)
BUN: 17 mg/dL (ref 7–25)
CO2: 27 mmol/L (ref 20–31)
CREATININE: 1.13 mg/dL (ref 0.70–1.25)
Calcium: 9.3 mg/dL (ref 8.6–10.3)
Chloride: 102 mmol/L (ref 98–110)
Glucose, Bld: 86 mg/dL (ref 65–99)
Potassium: 4.7 mmol/L (ref 3.5–5.3)
SODIUM: 138 mmol/L (ref 135–146)
TOTAL PROTEIN: 6.6 g/dL (ref 6.1–8.1)

## 2016-12-26 LAB — TSH: TSH: 4.72 m[IU]/L — AB (ref 0.40–4.50)

## 2016-12-26 MED ORDER — HYDROCODONE-ACETAMINOPHEN 7.5-325 MG/15ML PO SOLN: ORAL | 0 refills | Status: DC

## 2016-12-26 MED ORDER — CIPROFLOXACIN HCL 500 MG PO TABS: 500.0000 mg | ORAL_TABLET | Freq: Two times a day (BID) | ORAL | 0 refills | Status: DC

## 2016-12-26 MED ORDER — TRAMADOL HCL 50 MG PO TABS: ORAL_TABLET | ORAL | 0 refills | Status: DC

## 2016-12-26 NOTE — Progress Notes (Signed)
Office Visit Note   Patient: Austin Barnes           Date of Birth: 1950-10-03           MRN: [id number] Visit Date: 12/24/2016 Requested by: Kimber Relic, MD [phone] [place], [place] [address] PCP: Kimber Relic, MD  Subjective: Chief Complaint  Patient presents with  . Left Shoulder - Pain  . Right Shoulder - Pain    HPI: Austin Barnes is a 66 year old patient was very complicated past history.  He describes bilateral shoulder pain left morning worse than righthe reports radicular type arm pain but no numbness and tingling.  He describes neck pain and has known history of bulging disc previously diagnosed.  He is right-hand dominant.  Pain is worse after lying down or sitting.  He describes decreased range of motion in the shoulders.  He relates with a cane in the right hand.  He describes decreased muscle strength particular in that left arm.  He's taking oxycodone and Dilaudid and Norco for pain.  He has had 15 lumbar spine surgeries.  He has had cervical spine MRI scan in 2017 which was relatively normal.  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  He describes popping and grinding in the shoulder.  He does not have any money for  Physical therapy.              ROS: All systems reviewed are negative as they relate to the chief complaint within the history of present illness.  Patient denies  fevers or chills.   Assessment & Plan: Visit Diagnoses:  1. Acute pain of both shoulders   2. Cervical radiculopathy     Plan: impression is bilateral shoulder pain with potential component of radicular symptoms particularly with atrophy in that left arm.  Been going on for many months.  His left shoulder is his more symptomatic side.  Think we need MRI arthrogram of that left shoulder to evaluate for rotator cuff tear along with MRI of the cervical spine to evaluate for left-sided radiculitis and source of fairly significant atrophy  in the left arm compared to the right.  I'll see him back after the studies.  Follow-Up Instructions: Return for after MRI.   Orders:  Orders Placed This Encounter  Procedures  . XR Shoulder Right  . XR Shoulder Left  . MR Cervical Spine w/o contrast  . MR Shoulder Left w/ contrast  . Arthrogram   No orders of the defined types were placed in this encounter.     Procedures: No procedures performed   Clinical Data: No additional findings.  Objective: Vital Signs: There were no vitals taken for this visit.  Physical Exam:   Constitutional: Patient appears well-developed HEENT:  Head: Normocephalic Eyes:EOM are normal Neck: Normal range of motion Cardiovascular: Normal rate Pulmonary/chest: Effort normal Neurologic: Patient is alert Skin: Skin is warm Psychiatric: Patient has normal mood and affect    Ortho Exam: orthopedic exam demonstrates pretty reasonable cervical spine range of motion he does have 5 out of 5 grip EPL FPL interosseous wrist flexion and wrist extension biceps triceps and deltoid strength and has notable atrophy of the left arm musculature diffusely and uniformly on the left compared to the right.  This manifests itself as some diminished strength but it is about 10-15% on the left arm versus right.  No definite paresthesias C5 T1 reflexes symmetric bilateral biceps and  triceps.  Radial pulses intact.  Bilateral shoulder exam demonstrates full active and passive range of motion which is slightly better on the right than the left.  Rotator cuff strength is diminished on the left compared to the right to supraspinatus and infraspinatus testing.  No acromioclavicular joint tenderness to direct palpation left or right hand side.  No other masses lymph adenopathy or skin changes noted in the shoulder region.  Specialty Comments:  No specialty comments available.  Imaging: No results found.   PMFS History: Patient Active Problem List   Diagnosis Date  Noted  . Shoulder pain 10/30/2016  . 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 Leg pain 08/21/2016  . Tremor 08/21/2016  . Cough 07/06/2016  . Acute bronchitis 04/18/2016  . Loss of weight 12/14/2015  . Weak 12/14/2015  . Myalgia and myositis 12/14/2015  . Frequency of urination 12/14/2015  . Hypothyroidism 12/14/2014  . 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 Malaise 01/27/2014  . Hip pain 01/27/2014  . Pain in left elbow 01/27/2014  . Ganglion cyst 06/23/2013  . Chronic pain 04/08/2013  . 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 Arthritis   . Essential hypertension   . Hiatal hernia   . Peripheral vascular disease (HCC)   . GERD (gastroesophageal reflux disease)   . Anemia   . Testosterone insufficiency   . Hyperlipidemia    Past Medical History:  Diagnosis Date  . 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 Anemia   . Arthritis   . Chronic pain   . 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 DVT (deep venous thrombosis) (HCC)   . Heart murmur   . Hiatal hernia   . Hypothyroidism 12/14/2014  . Impotence   . Other and unspecified hyperlipidemia   . 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 Peripheral vascular disease (HCC)   . Reflux   . Testosterone insufficiency 2013  . Ulcer    Peptic disease  . Unspecified essential hypertension     Family History  Problem Relation Age of Onset  . Hypertension Mother   . Stroke Father [id number]    Past Surgical History:  Procedure Laterality Date  . CARPAL TUNNEL RELEASE Right 1995  . KIDNEY STONE SURGERY    . SPINE SURGERY     Social History   Occupational History  . disabled    Social History Main Topics  . Smoking status: Former Smoker    Packs/day: 0.25    Years: 3.00    Types: Cigarettes  . Smokeless tobacco: Former NeurosurgeonUser    Quit date: 07/17/2007     Comment: Last quit date was 8-10 years ago as of 2018   .  Alcohol use No  . Drug use: No  . Sexual activity: No

## 2016-12-26 NOTE — Progress Notes (Signed)
Facility  [facility]    Place of Service:   OFFICE    Allergies  Allergen Reactions  . Codeine   . Demerol   . Disalcid [Salsalate]   . Feldene [Piroxicam]   . Penicillins     Has patient had a PCN reaction causing immediate rash, facial/tongue/throat swelling, SOB or lightheadedness with hypotension: NO Has patient had a PCN reaction causing severe rash involving mucus membranes or skin necrosis: NO Has patient had a PCN reaction that required hospitalization NO Has patient had a PCN reaction occurring within the last 10 years: No If all of the above answers are "NO", then may proceed with Cephalosporin use.  Austin Barnes Antibiotics     Chief Complaint  Patient presents with  . Medical Management of Chronic Issues    2 month medication management blood pressure, blood sugar, thyroid, chronic pain.     HPI:  Pain at the left shoulder going down the left arm. Saw Dr. Marlou Sa. Was to be scheduled for MRI of the left shoulder and cervical spine. CS has already been imaged. He did not go to the MRI for the shoulder due to financial issues.   Last ESR was 30, down from 66.  Still losing weight. Down to 193# from 226# in March 2018.  Dr. Mariea Clonts wanted to send him for Pulmonary consult due to persistent cough and chronic bronchitis. He does not want to go due to financial issues. He is having some trouble breathing and is producing colored Kilan Banfill sputum.  Pain in the feet and burning is helped by applications of Nitrobid ointment  Essential hypertension - continues with elevation in the SBP. Denies headache, chest pain, or palpitations.  Testosterone insufficiency - asking about resuming the Androgel. Previously did not want to use because of the cost. Testosterone level 09/12/16 was low at 19 ng/dL.  Cough - persistent. Using narcotic cough medications for relief.  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 Hgb A1c has been  minimally elevated.  Anemia, unspecified type - resolved with HGB 12.8 on 12/14/15  Hyperlipidemia, unspecified hyperlipidemia type - on 5/31/198, he had normal total chol, normal LDL, normal HDL, and a mild elevation in the triglycerides at 215.  Hypothyroidism, unspecified type - normal TSH on 12/13/16.  Acute bronchitis, unspecified organism - persistent cough and heavy sensation inn the chest.  Loss of weight  Weak    Medications: Patient's Medications  New Prescriptions   No medications on file  Previous Medications   BACLOFEN (LIORESAL) 10 MG TABLET    TAKE 1 TABLET THREE TIMES DAILY AS NEEDED FOR MUSCLE RELAXATION.   CARISOPRODOL (SOMA) 350 MG TABLET    Take 1 tablet (350 mg total) by mouth 4 (four) times daily as needed for muscle spasms.   DENTA 5000 PLUS 1.1 % CREA DENTAL CREAM    BRUSH ON TEETH FOR 2 MINUTES THEN SWISH AND EXPECTORATE.   DIAZEPAM (VALIUM) 10 MG TABLET    Take one tablet by mouth up to four times daily to relax muscles   GABAPENTIN (NEURONTIN) 300 MG CAPSULE    Take 1 capsule (300 mg total) by mouth 2 (two) times daily.   HYDROCODONE-ACETAMINOPHEN (HYCET) 7.5-325 MG/15 ML SOLUTION    One tsp up to 4 times in 24 hours to help control cough   HYDROCODONE-ACETAMINOPHEN (NORCO) 7.5-325 MG TABLET    Take one tablet every 6 hour if needed for pain   HYDROMORPHONE (  DILAUDID) 8 MG TABLET    Take one tablet every 3 hours to control pain   LEVOTHYROXINE (SYNTHROID, LEVOTHROID) 50 MCG TABLET    TAKE 1 TABLET EACH DAY FOR THYROID.   LOSARTAN (COZAAR) 50 MG TABLET    One daily to control BP   METHADONE (DOLOPHINE) 10 MG TABLET    Take four tablets every 6 hours to control pain   MINOXIDIL (LONITEN) 10 MG TABLET    TAKE 1 TABLET DAILY TO CONTROL BLOOD PRESSURE.   NITRO-BID 2 % OINTMENT    APPLY 1 INCH STRIP TO FEET TWICE DAILY.   OMEPRAZOLE (PRILOSEC) 20 MG CAPSULE    TAKE (2) CAPSULES TWICE DAILY.   OXYCODONE HCL 10 MG TABS    Take four tablets by mouth every 6 hours as  needed for pain   PROCHLORPERAZINE (COMPAZINE) 10 MG TABLET    TAKE 1 TABLET EVERY 8 HOURS AS NEEDED FOR NAUSEA.   TEMAZEPAM (RESTORIL) 30 MG CAPSULE    TAKE 1 CAPSULE ONCE DAILY AT BEDTIME AS NEEDED FOR REST.   TESTOSTERONE (ANDROGEL PUMP) 20.25 MG/ACT (1.62%) GEL    APPLY 2 PUMP ACTUATIONS DAILY FOR TESTOSTERONE REPLACEMENT.   TRAMADOL (ULTRAM) 50 MG TABLET    TAKE 1 TABLET UP TO FOUR TIMES DAILY AS NEEDED FOR PAIN.   VOLTAREN 1 % GEL    APPLY 2-4GM TO AFFECTED AREA UP TO 4 TIMES A DAY  Modified Medications   No medications on file  Discontinued Medications   No medications on file    Review of Systems  Constitutional: Positive for activity change, appetite change and unexpected weight change (weight fell to 188# from 226 on 10/08/16).       Chronically ill  HENT: Negative for congestion and rhinorrhea.   Eyes: Positive for visual disturbance.  Respiratory: Positive for cough, chest tightness and shortness of breath.        Producing sputum  Cardiovascular: Positive for leg swelling. Negative for chest pain and palpitations.       History of peripheral artery disease.  Gastrointestinal: Positive for diarrhea.       History of chronic and recurrent pancreatitis. Loose stools from time to time. Upper GI distress and epigastric pain.  Endocrine: Positive for polydipsia.       History of diabetes mellitus. Recurrent mild elevations in the glucose, and mild elevation in the A1c.  Musculoskeletal: Positive for arthralgias, back pain, gait problem, joint swelling (Right knee), myalgias, neck pain and neck stiffness.       Pain in both hips, knees and shoulders.  Skin: Negative.   Allergic/Immunologic: Negative.   Neurological: Positive for dizziness, tremors, weakness and numbness.  Hematological:       History of anemia.  Psychiatric/Behavioral: Positive for decreased concentration and dysphoric mood. Negative for agitation, behavioral problems, confusion, hallucinations and suicidal  ideas. The patient is nervous/anxious.     Vitals:   12/26/16 1130  BP: (!) 156/84  Pulse: 73  Temp: 98.8 F (37.1 C)  TempSrc: Oral  SpO2: 94%  Weight: 193 lb (87.5 kg)  Height: 5' 10"  (1.778 m)   Body mass index is 27.69 kg/m. Wt Readings from Last 3 Encounters:  12/26/16 193 lb (87.5 kg)  10/23/16 221 lb (100.2 kg)  10/08/16 226 lb (102.5 kg)      Physical Exam  Constitutional: He is oriented to person, place, and time.  Mesomorphic. Rambling in thought content. Chronically ill appearance.  HENT:  Head: Normocephalic and atraumatic.  Right  Ear: External ear normal.  Left Ear: External ear normal.  Nose: Nose normal.  Mouth/Throat: Oropharynx is clear and moist.  Eyes: Conjunctivae and EOM are normal. Pupils are equal, round, and reactive to light.  Neck: No JVD present. No tracheal deviation present. No thyromegaly present.  Cardiovascular: Normal rate, regular rhythm and normal heart sounds.  Exam reveals no gallop and no friction rub.   No murmur heard. Pulmonary/Chest: No respiratory distress. He has no wheezes. He has no rales.  Abdominal: He exhibits no distension and no mass. There is no tenderness.  Musculoskeletal: He exhibits edema and tenderness.  Muscular weakness and pains. Unstable gait. Uses walker. Velcro brace on the right knee. Ganglion cyst left wrist dorsum. Some pain in both shoulders at rest and on attempts to raise arms and rotate shoulders.  Lymphadenopathy:    He has no cervical adenopathy.  Neurological: He is alert and oriented to person, place, and time. He displays abnormal reflex. No cranial nerve deficit. Coordination abnormal.  tremor  Skin: No rash noted. No erythema. There is pallor.  Psychiatric:  Rambling speech. Frustrated.    Labs reviewed: No flowsheet data found. Lab Results  Component Value Date   TSH 1.320 12/14/2015   TSH 3.890 12/14/2014   TSH 2.080 01/27/2014   Lab Results  Component Value Date   BUN 22  09/28/2016   BUN 32 (H) 09/24/2016   BUN 16 12/14/2015   Lab Results  Component Value Date   HGBA1C 5.8 (H) 12/14/2015   HGBA1C 5.8 (H) 12/14/2014   HGBA1C 6.0 (H) 01/27/2014    Assessment/Plan  1. Essential hypertension - continue current medications - Comprehensive metabolic panel  2. 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 Testosterone insufficiency Follow up low testosterone. Resume Androgel. - Testosterone  5. Cough - HYDROcodone-acetaminophen (HYCET) 7.5-325 mg/15 ml solution; One tsp up to 4 times in 24 hours to help control cough  Dispense: 480 mL; Refill: 0  6. 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 Anemia, unspecified type resolved  8. Hyperlipidemia, unspecified hyperlipidemia type Follow lab in th future  9. Hypothyroidism, unspecified type Continue current dose levothyroxine - TSH  10. Acute bronchitis, unspecified organism - ciprofloxacin (CIPRO) 500 MG tablet; Take 1 tablet (500 mg total) by mouth 2 (two) times daily.  Dispense: 20 tablet; Refill: 0  11. Loss of weight Increase caloric intake  12. Weak Multiple causes including weight loss, multiple sedating drugs

## 2016-12-27 LAB — HEMOGLOBIN A1C
Hgb A1c MFr Bld: 5.5 % (ref <5.7)
Mean Plasma Glucose: 111 mg/dL

## 2017-01-05 ENCOUNTER — Other Ambulatory Visit: Payer: Self-pay | Admitting: Internal Medicine

## 2017-01-07 ENCOUNTER — Telehealth: Payer: Self-pay | Admitting: *Deleted

## 2017-01-07 DIAGNOSIS — E039 Hypothyroidism, unspecified: Secondary | ICD-10-CM

## 2017-01-07 DIAGNOSIS — E349 Endocrine disorder, unspecified: Secondary | ICD-10-CM

## 2017-01-07 NOTE — Telephone Encounter (Signed)
Patient had labs done last week and would like to know the results from these. Please Advise.

## 2017-01-08 MED ORDER — LEVOTHYROXINE SODIUM 75 MCG PO TABS: ORAL_TABLET | ORAL | 1 refills | Status: DC

## 2017-01-08 NOTE — Telephone Encounter (Signed)
I did not get them b/c they were in as ordered by Dr. Chilton SiGreen.  I will review now.

## 2017-01-08 NOTE — Telephone Encounter (Signed)
Labs were normal except the TSH which is slightly elevated.  This may also contribute to the weight gain he'd complained about when I saw him last.  Let's increase his levothyroxine to 75mcg po daily and recheck his TSH in 6 weeks.  Sugar average has improved to normal range off steroids.  The testosterone shows as not drawn.  It appears it was meant to be done so I'm not certain about why that is the case.    Katelyne Galster L. Toneisha Savary, D.O. Geriatrics [facility] [address] [place], [place] [address] Cell Phone (Mon-Fri 8am-5pm):  [phone] On Call:  [phone] & follow prompts after 5pm & weekends Office Phone:  [phone] Office Fax:  [phone]

## 2017-01-08 NOTE — Telephone Encounter (Signed)
Patient notified and agreed. Rx faxed to pharmacy and Lab scheduled for July 31

## 2017-01-11 ENCOUNTER — Telehealth (INDEPENDENT_AMBULATORY_CARE_PROVIDER_SITE_OTHER): Payer: Self-pay | Admitting: Orthopedic Surgery

## 2017-01-11 ENCOUNTER — Other Ambulatory Visit: Payer: Self-pay | Admitting: *Deleted

## 2017-01-11 DIAGNOSIS — G894 Chronic pain syndrome: Secondary | ICD-10-CM

## 2017-01-11 MED ORDER — HYDROMORPHONE HCL 8 MG PO TABS
ORAL_TABLET | ORAL | 0 refills | Status: DC
Start: 2017-01-11 — End: 2017-02-08

## 2017-01-11 NOTE — Telephone Encounter (Signed)
Patient called advised the pain in his shoulder is running down to his elbow. Patient said he wanted Dr. August Saucerean to know. The number to contact patient is [phone]

## 2017-01-11 NOTE — Telephone Encounter (Signed)
Patient requested and will pick up 

## 2017-01-14 ENCOUNTER — Other Ambulatory Visit: Payer: Medicare Other

## 2017-01-14 NOTE — Telephone Encounter (Signed)
FYI-see below- 

## 2017-01-15 NOTE — Telephone Encounter (Signed)
Is he coming back in?

## 2017-01-15 NOTE — Telephone Encounter (Signed)
MRI 01/23/17 and he will come in after scan.

## 2017-01-17 ENCOUNTER — Other Ambulatory Visit: Payer: Self-pay | Admitting: Internal Medicine

## 2017-01-17 ENCOUNTER — Other Ambulatory Visit: Payer: Self-pay | Admitting: *Deleted

## 2017-01-17 ENCOUNTER — Other Ambulatory Visit: Payer: Self-pay | Admitting: Nurse Practitioner

## 2017-01-18 ENCOUNTER — Other Ambulatory Visit: Payer: Self-pay | Admitting: Internal Medicine

## 2017-01-18 MED ORDER — METHADONE HCL 10 MG PO TABS: ORAL_TABLET | ORAL | 0 refills | Status: DC

## 2017-01-18 MED ORDER — HYDROCODONE-ACETAMINOPHEN 7.5-325 MG PO TABS: ORAL_TABLET | ORAL | 0 refills | Status: DC

## 2017-01-18 MED ORDER — TEMAZEPAM 30 MG PO CAPS: ORAL_CAPSULE | ORAL | 0 refills | Status: DC

## 2017-01-18 MED ORDER — OXYCODONE HCL 10 MG PO TABS: ORAL_TABLET | ORAL | 0 refills | Status: DC

## 2017-01-21 ENCOUNTER — Telehealth: Payer: Self-pay

## 2017-01-21 NOTE — Telephone Encounter (Signed)
Patient had left message on voice mail for refill on Hycet. Last refill on 12/26/16.  Spoke with Dr. Renato Gailseed. She will not refill this until he goes and see pulmonary doctor for his chronic cough. Called left message on patient's voice mail, we will make that appt, just call the office back to set up appt, leave message on Dorothy's voice mail.

## 2017-01-21 NOTE — Telephone Encounter (Signed)
He saw pulmonary doctor about 15 years, he doesn't feel he needs to see another one. Told him that was a long time ago, things change. He doesn't want to go. Told him I would let Dr. Renato Gailseed know. That we would not be refilling the Hycet.

## 2017-01-21 NOTE — Telephone Encounter (Signed)
Noted.  No refills on controlled cough syrup.  This is a short term medication.  Pt should see pulmonology with this persistent cough, but refuses.  CXRs have been normal.

## 2017-01-23 ENCOUNTER — Other Ambulatory Visit: Payer: Medicare Other

## 2017-01-23 ENCOUNTER — Other Ambulatory Visit: Payer: Self-pay | Admitting: Internal Medicine

## 2017-01-24 ENCOUNTER — Ambulatory Visit (INDEPENDENT_AMBULATORY_CARE_PROVIDER_SITE_OTHER): Payer: Medicare Other | Admitting: Orthopedic Surgery

## 2017-01-25 ENCOUNTER — Other Ambulatory Visit: Payer: Self-pay | Admitting: Internal Medicine

## 2017-01-28 ENCOUNTER — Other Ambulatory Visit: Payer: Medicare Other

## 2017-01-28 DIAGNOSIS — E349 Endocrine disorder, unspecified: Secondary | ICD-10-CM

## 2017-01-28 DIAGNOSIS — E039 Hypothyroidism, unspecified: Secondary | ICD-10-CM

## 2017-01-28 LAB — TSH: TSH: 2.76 mIU/L (ref 0.40–4.50)

## 2017-01-29 ENCOUNTER — Other Ambulatory Visit: Payer: Self-pay | Admitting: Internal Medicine

## 2017-01-29 LAB — TESTOSTERONE: Testosterone: 1285 ng/dL — ABNORMAL HIGH (ref 250–827)

## 2017-01-31 ENCOUNTER — Telehealth: Payer: Self-pay | Admitting: *Deleted

## 2017-01-31 NOTE — Telephone Encounter (Signed)
Spoke with patient and advised results Med list updated 

## 2017-02-08 ENCOUNTER — Other Ambulatory Visit: Payer: Self-pay | Admitting: *Deleted

## 2017-02-08 ENCOUNTER — Telehealth: Payer: Self-pay | Admitting: *Deleted

## 2017-02-08 DIAGNOSIS — G894 Chronic pain syndrome: Secondary | ICD-10-CM

## 2017-02-08 DIAGNOSIS — R52 Pain, unspecified: Secondary | ICD-10-CM

## 2017-02-08 MED ORDER — HYDROMORPHONE HCL 8 MG PO TABS: ORAL_TABLET | ORAL | 0 refills | Status: DC

## 2017-02-08 NOTE — Telephone Encounter (Signed)
Noted.  We still need to place the referral.  Does this mean he's also getting pain medication from [facility]?  I didn't see that on the database.

## 2017-02-08 NOTE — Telephone Encounter (Signed)
-----   Message from Kermit Baloiffany L Reed, DO sent at 02/08/2017 12:34 PM EDT ----- Regarding: RE: narcotics Let's give him just a one week supply and place a pain clinic referral.  He will be upset, but that is the plan for when he finally comes in for his appt anyway.  He really needs an addiction medicine referral but I know he certainly will not be agreeable to it and he has to make his own appt for that.    ----- Message ----- From: Sueanne MargaritaSmith, Trajon Rosete L, CMA Sent: 02/08/2017  10:30 AM To: Kermit Baloiffany L Reed, DO Subject: narcotics                                      Pt is due for his Dilaudid today and Dr. Montez Moritaarter doesn't feel comfortable in signing the rx for #240 pt gets this every 3 hours to control pain.

## 2017-02-08 NOTE — Telephone Encounter (Signed)
Patient requested and will pick up 

## 2017-02-08 NOTE — Telephone Encounter (Signed)
Spoke with patient and advised that rx ready for pick-up, #240 ( sorry I had spoke with patient before receiving your message) and advised to patient that we would put in referral to pain management. Per patient he can not afford to go to pain management. Per pt he has been on these medication for over 40 years and can't be without them. Per pt [facility] and years ago Dr. Chilton SiGreen agreed to take over prescribing these medications. Per pt he will call Dr. Chilton SiGreen. I advised to pt that now Dr. Renato Gailseed is his PCP and can not manage his pain medications.

## 2017-02-08 NOTE — Telephone Encounter (Signed)
Patient walked in to get his prescription and wanted to speak with me and stated that Dr. Chilton SiGreen had spoke with Dr. Renato Gailseed and thought everything was ok with writing his prescriptions? I advised pt that with the amount of medication he is on, Dr Renato Gailseed and none of the other providers feel comfortable signing his prescriptions. Per pt he has a lot of illnesses and chronic pain and [facility] and [facility] used to give him 500 percocet a month so why does Dr. Renato Gailseed have a problem with signing his prescriptions? Per pt he stated Dr. Renato Gailseed has signed them before, I advised pt that was because Dr.Green was still here. Pt still stating that he will call Dr. Chilton SiGreen and get his prescriptions straight.

## 2017-02-11 NOTE — Telephone Encounter (Signed)
Referral done

## 2017-02-12 ENCOUNTER — Other Ambulatory Visit: Payer: Self-pay

## 2017-02-14 ENCOUNTER — Other Ambulatory Visit: Payer: Self-pay | Admitting: Internal Medicine

## 2017-02-14 ENCOUNTER — Telehealth: Payer: Self-pay

## 2017-02-14 NOTE — Telephone Encounter (Signed)
No refills on temazepam due to risk of respiratory depression when combined with the pain medications

## 2017-02-14 NOTE — Telephone Encounter (Signed)
Patient called to inform clinical intake that he is due for   Oxycodone #480 Hydrocodone #120 Methadone #480 Temazepam #30  Patient would like to know if Dr.Reed will authorize for him to have refills on Temazepam? Please advise  * Patient placed on list for refills in Clinical Intake Area*

## 2017-02-15 ENCOUNTER — Other Ambulatory Visit: Payer: Self-pay | Admitting: *Deleted

## 2017-02-15 ENCOUNTER — Other Ambulatory Visit: Payer: Self-pay | Admitting: Internal Medicine

## 2017-02-15 ENCOUNTER — Telehealth: Payer: Self-pay | Admitting: *Deleted

## 2017-02-15 MED ORDER — TEMAZEPAM 30 MG PO CAPS: ORAL_CAPSULE | ORAL | 0 refills | Status: DC

## 2017-02-15 MED ORDER — METHADONE HCL 10 MG PO TABS: ORAL_TABLET | ORAL | 0 refills | Status: DC

## 2017-02-15 MED ORDER — OXYCODONE HCL 10 MG PO TABS: ORAL_TABLET | ORAL | 0 refills | Status: DC

## 2017-02-15 MED ORDER — HYDROCODONE-ACETAMINOPHEN 7.5-325 MG PO TABS: ORAL_TABLET | ORAL | 0 refills | Status: DC

## 2017-02-15 MED ORDER — TESTOSTERONE 20.25 MG/ACT (1.62%) TD GEL: 2.0000 | Freq: Every day | TRANSDERMAL | 0 refills | Status: DC

## 2017-02-15 MED ORDER — BACLOFEN 10 MG PO TABS: ORAL_TABLET | ORAL | 0 refills | Status: DC

## 2017-02-15 NOTE — Telephone Encounter (Signed)
Patient requested and will pick up 

## 2017-02-15 NOTE — Telephone Encounter (Signed)
Austin Barnes with [facility] called and requested refill on patient's Androgel and Baclofen. Last filled on 01/18/17. Pharmacy will wait and fill on Sunday. Approval given.

## 2017-02-18 ENCOUNTER — Ambulatory Visit
Admission: RE | Admit: 2017-02-18 | Discharge: 2017-02-18 | Disposition: A | Discharge status: Home / Self Care | Payer: Medicare Other | Source: Ambulatory Visit | Attending: Orthopedic Surgery | Admitting: Orthopedic Surgery

## 2017-02-18 DIAGNOSIS — M25512 Pain in left shoulder: Principal | ICD-10-CM

## 2017-02-18 DIAGNOSIS — M25511 Pain in right shoulder: Secondary | ICD-10-CM

## 2017-02-18 DIAGNOSIS — M19012 Primary osteoarthritis, left shoulder: Secondary | ICD-10-CM | POA: Diagnosis not present

## 2017-02-18 MED ORDER — IOPAMIDOL (ISOVUE-M 200) INJECTION 41%
10.0000 mL | Freq: Once | INTRAMUSCULAR | Status: AC
  Administered 2017-02-18: 10 mL via INTRA_ARTICULAR

## 2017-02-19 ENCOUNTER — Telehealth: Payer: Self-pay | Admitting: *Deleted

## 2017-02-19 ENCOUNTER — Other Ambulatory Visit: Payer: Self-pay | Admitting: Internal Medicine

## 2017-02-19 NOTE — Telephone Encounter (Signed)
Patient called and stated that he cannot afford the Pain Clinic where his referral was initially sent to.  Patient is requesting to go to: Dr. Noni Saupearwa [address]

## 2017-02-20 NOTE — Telephone Encounter (Signed)
It appears this was addressed in the referral queue and he requested a different physician when he was called back.  Hopefully, it will be a success because none of us feel comfortable dispensing these large volumes of opioids to [name].

## 2017-02-21 ENCOUNTER — Other Ambulatory Visit: Payer: Self-pay | Admitting: Internal Medicine

## 2017-02-22 ENCOUNTER — Other Ambulatory Visit: Payer: Self-pay | Admitting: Internal Medicine

## 2017-02-22 ENCOUNTER — Telehealth (INDEPENDENT_AMBULATORY_CARE_PROVIDER_SITE_OTHER): Payer: Self-pay

## 2017-02-22 ENCOUNTER — Telehealth (INDEPENDENT_AMBULATORY_CARE_PROVIDER_SITE_OTHER): Payer: Self-pay | Admitting: Orthopedic Surgery

## 2017-02-22 NOTE — Telephone Encounter (Signed)
Returned patient call concerning MRI results.  Advised patient to return call.

## 2017-02-22 NOTE — Telephone Encounter (Signed)
Returned call to patient left message to return call to schedule appointment for MRI review [phone]

## 2017-02-22 NOTE — Telephone Encounter (Signed)
Patient called asked if he can get a call concerning  his MRI results. The number to contact patient is [phone]

## 2017-02-25 ENCOUNTER — Other Ambulatory Visit: Payer: Self-pay | Admitting: Internal Medicine

## 2017-02-26 ENCOUNTER — Other Ambulatory Visit: Payer: Self-pay | Admitting: Internal Medicine

## 2017-02-26 NOTE — Telephone Encounter (Signed)
IC pt and discussed the below, and I sched f/u appt with August Saucerean 03/13/17 for him

## 2017-02-26 NOTE — Telephone Encounter (Signed)
Can you tell me what I can advise patient on shoulder MRI results? Since August SaucerDean is out of office x 2 weeks.   IC patient and advised Dr August Saucerean is out of office right now.  LMVM.

## 2017-02-26 NOTE — Telephone Encounter (Signed)
He can tell him that his rotator cuff is intact and there was no major tears in his shoulder. There is only some mild arthritic changes at the shoulder acromioclavicular joint and some mild proximal biceps tendinitis. Dr. August Saucerean can go over those findings with him when he comes back to see him and what that means for his shoulder.

## 2017-02-28 ENCOUNTER — Other Ambulatory Visit: Payer: Self-pay | Admitting: Internal Medicine

## 2017-03-04 ENCOUNTER — Other Ambulatory Visit: Payer: Self-pay | Admitting: *Deleted

## 2017-03-04 MED ORDER — LEVOTHYROXINE SODIUM 75 MCG PO TABS: ORAL_TABLET | ORAL | 0 refills | Status: DC

## 2017-03-08 ENCOUNTER — Other Ambulatory Visit: Payer: Self-pay | Admitting: *Deleted

## 2017-03-08 DIAGNOSIS — G894 Chronic pain syndrome: Secondary | ICD-10-CM

## 2017-03-08 MED ORDER — HYDROMORPHONE HCL 8 MG PO TABS: ORAL_TABLET | ORAL | 0 refills | Status: DC

## 2017-03-08 NOTE — Telephone Encounter (Signed)
Patient requested and will pick up NCCSRS Database Checked.  

## 2017-03-13 ENCOUNTER — Ambulatory Visit (INDEPENDENT_AMBULATORY_CARE_PROVIDER_SITE_OTHER): Payer: Medicare Other | Admitting: Orthopedic Surgery

## 2017-03-13 ENCOUNTER — Encounter (INDEPENDENT_AMBULATORY_CARE_PROVIDER_SITE_OTHER): Payer: Self-pay | Admitting: Orthopedic Surgery

## 2017-03-13 DIAGNOSIS — M25511 Pain in right shoulder: Secondary | ICD-10-CM | POA: Diagnosis not present

## 2017-03-13 DIAGNOSIS — M25512 Pain in left shoulder: Secondary | ICD-10-CM

## 2017-03-14 ENCOUNTER — Other Ambulatory Visit: Payer: Self-pay | Admitting: Internal Medicine

## 2017-03-14 NOTE — Telephone Encounter (Signed)
This medication appears to be a long term use for patient, can additional refills be provided?  Please advise

## 2017-03-14 NOTE — Progress Notes (Signed)
Office Visit Note   Patient: Austin SprayDavid S Barnes           Date of Birth: [birth year]           MRN: [id number] Visit Date: 03/13/2017 Requested by: Kermit Baloeed, Tiffany L, DO [address] [place] [place] [id number] PCP: Kermit Baloeed, Tiffany L, DO  Subjective: Chief Complaint  Patient presents with  . Left Shoulder - Follow-up    HPI: Austin KocherDavid Barnes is a 61 4L patient with left shoulder pain.  Since I have seen him he has had an MRI scan.  That scan is reviewed with the patient.  Rotator cuff is intact.  Mild acromioclavicular joint arthropathy is present.  He describes it is hard and painful for him to get up out of the chair.  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  Injection in the left shoulder was not helpful.              ROS: All systems reviewed are negative as they relate to the chief complaint within the history of present illness.  Patient denies  fevers or chills.   Assessment & Plan: Visit Diagnoses: No diagnosis found.  Plan: Impression is left shoulder pain.  MRI scan is essentially normal.  This is likely coming from his neck.  He'll need to have that worked up further by his neck position.  No further indication for shoulder workup at this time.  If he wants to proceed with further workup on his neck that can be arranged.  I'll see him back as needed  Follow-Up Instructions: Return if symptoms worsen or fail to improve.   Orders:  No orders of the defined types were placed in this encounter.  No orders of the defined types were placed in this encounter.     Procedures: No procedures performed   Clinical Data: No additional findings.  Objective: Vital Signs: There were no vitals taken for this visit.  Physical Exam:   Constitutional: Patient appears well-developed HEENT:  Head: Normocephalic Eyes:EOM are normal Neck: Normal range of motion Cardiovascular: Normal rate Pulmonary/chest: Effort normal Neurologic: Patient is alert Skin: Skin is  warm Psychiatric: Patient has normal mood and affect    Ortho Exam: Orthopedic exam demonstrates pretty reasonable cervical spine range of motion with not much in way of crepitus with shoulder range of motion.  Rotator cuff strength is intact.  Passive range of motion is maintained.  No acromioclavicular joint tenderness to direct palpation is present.  The rest of the shoulder exam is unchanged from last visit  Specialty Comments:  No specialty comments available.  Imaging: No results found.   PMFS History: Patient Active Problem List   Diagnosis Date Noted  . 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 Shoulder pain 10/30/2016  . 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 Leg pain 08/21/2016  . Tremor 08/21/2016  . Cough 07/06/2016  . Acute bronchitis 04/18/2016  . Loss of weight 12/14/2015  . Weak 12/14/2015  . Myalgia and myositis 12/14/2015  . Frequency of urination 12/14/2015  . Hypothyroidism 12/14/2014  . 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 Malaise 01/27/2014  . Hip pain 01/27/2014  . Pain in left elbow 01/27/2014  . Ganglion cyst 06/23/2013  . Chronic pain 04/08/2013  . 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 Arthritis   . Essential hypertension   . Hiatal hernia   . Peripheral vascular disease (HCC)   .  GERD (gastroesophageal reflux disease)   . Anemia   . Testosterone insufficiency   . Hyperlipidemia    Past Medical History:  Diagnosis Date  . 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 Anemia   . Arthritis   . Chronic pain   . 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 DVT (deep venous thrombosis) (HCC)   . Heart murmur   . Hiatal hernia   . Hypothyroidism 12/14/2014  . Impotence   . Other and unspecified hyperlipidemia   . 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 Peripheral vascular disease (HCC)   . Reflux   . Testosterone insufficiency 2013  . Ulcer     Peptic disease  . Unspecified essential hypertension     Family History  Problem Relation Age of Onset  . Hypertension Mother   . Stroke Father 22    Past Surgical History:  Procedure Laterality Date  . CARPAL TUNNEL RELEASE Right 1995  . KIDNEY STONE SURGERY    . SPINE SURGERY     Social History   Occupational History  . disabled    Social History Main Topics  . Smoking status: Former Smoker    Packs/day: 0.25    Years: 3.00    Types: Cigarettes  . Smokeless tobacco: Former Neurosurgeon    Quit date: 07/17/2007     Comment: Last quit date was 8-10 years ago as of 2018   . Alcohol use No  . Drug use: No  . Sexual activity: No

## 2017-03-15 ENCOUNTER — Telehealth: Payer: Self-pay

## 2017-03-15 ENCOUNTER — Other Ambulatory Visit: Payer: Self-pay | Admitting: *Deleted

## 2017-03-15 MED ORDER — HYDROCODONE-ACETAMINOPHEN 7.5-325 MG PO TABS: ORAL_TABLET | ORAL | 0 refills | Status: DC

## 2017-03-15 MED ORDER — TEMAZEPAM 30 MG PO CAPS: ORAL_CAPSULE | ORAL | 0 refills | Status: DC

## 2017-03-15 MED ORDER — OXYCODONE HCL 10 MG PO TABS: ORAL_TABLET | ORAL | 0 refills | Status: DC

## 2017-03-15 MED ORDER — METHADONE HCL 10 MG PO TABS: ORAL_TABLET | ORAL | 0 refills | Status: DC

## 2017-03-15 NOTE — Telephone Encounter (Signed)
Received authorization request on Methadone. [facility] to ask about it. His insurance plan only allows 6 tablets per day, he is taking 4 tablets every 6 hours. Will place request in Dr. Hebert Sohoeeds folder and forward note to Dr. Renato Gailseed for reason/diagnosis for quantify. Left message on patients voice mail, that we will work on this Tuesday 03/19/17.

## 2017-03-15 NOTE — Telephone Encounter (Signed)
Patient Requested and will pick up NCCSRS Database Checked.  

## 2017-03-15 NOTE — Telephone Encounter (Signed)
Patient aware of Dr.Reed's response and indicates he is writing Dr.Reed a letter about his overall health concerns/problem list

## 2017-03-18 NOTE — Telephone Encounter (Signed)
Pt needs his pain clinic referral followed up.  He's been rejected by two places.  I suggest he return to one of the former prescribers of his opioids that he mentions "at [facility] or [facility]".  I do not want to keep prescribing this much pain medication.  I also recommend he see an addiction specialist, perhaps the group [place] [address] [place] [address].

## 2017-03-21 ENCOUNTER — Other Ambulatory Visit: Payer: Self-pay | Admitting: Internal Medicine

## 2017-03-21 NOTE — Telephone Encounter (Signed)
.  left message to have patient return my call.  

## 2017-03-21 NOTE — Telephone Encounter (Signed)
Patient called back and I advised results, per pt Dr. Chilton SiGreen would do the prior auth without any questions. Pt is take 16 methadone a day and can't live on just 6. Per pt all the [facility] are dead that used to treat him. Pt states he was [facility] a research pain clinic [facility] that was using stimulators. Then [facility] and they where using Morphine pumps. Pt just want a quantity exception and prior auth done. I advised to pt that Dr. Renato Gailseed does not feel comfortable filling this pain medicine for this quantity. Pt states this was cleared by Dr. Chilton SiGreen. I advised to pt again that Dr. Chilton SiGreen is no longer his PCP and Dr. Renato Gailseed practice different. Pt states Dr. Renato Gailseed should have spoke up about not being comfortable with prescribing before Dr. Chilton SiGreen left because per pt she was writing and signing rx's then. Please advise  ( form in your RX folder)

## 2017-03-22 ENCOUNTER — Ambulatory Visit (INDEPENDENT_AMBULATORY_CARE_PROVIDER_SITE_OTHER): Payer: Medicare Other | Admitting: Nurse Practitioner

## 2017-03-22 ENCOUNTER — Other Ambulatory Visit: Payer: Self-pay | Admitting: Internal Medicine

## 2017-03-22 ENCOUNTER — Ambulatory Visit
Admission: RE | Admit: 2017-03-22 | Discharge: 2017-03-22 | Disposition: A | Discharge status: Home / Self Care | Payer: Medicare Other | Source: Ambulatory Visit | Attending: Nurse Practitioner | Admitting: Nurse Practitioner

## 2017-03-22 ENCOUNTER — Encounter: Payer: Self-pay | Admitting: Nurse Practitioner

## 2017-03-22 VITALS — BP 146/88 | HR 69 | Temp 98.5°F | Resp 17 | Ht 70.0 in | Wt 188.2 lb

## 2017-03-22 DIAGNOSIS — R0789 Other chest pain: Secondary | ICD-10-CM | POA: Diagnosis not present

## 2017-03-22 DIAGNOSIS — J40 Bronchitis, not specified as acute or chronic: Secondary | ICD-10-CM | POA: Diagnosis not present

## 2017-03-22 LAB — CBC WITH DIFFERENTIAL/PLATELET
BASOS ABS: 7 {cells}/uL (ref 0–200)
Basophils Relative: 0.1 %
EOS ABS: 47 {cells}/uL (ref 15–500)
EOS PCT: 0.7 %
HEMATOCRIT: 37 % — AB (ref 38.5–50.0)
Hemoglobin: 12 g/dL — ABNORMAL LOW (ref 13.2–17.1)
Lymphs Abs: 938 cells/uL (ref 850–3900)
MCH: 26.4 pg — AB (ref 27.0–33.0)
MCHC: 32.4 g/dL (ref 32.0–36.0)
MCV: 81.5 fL (ref 80.0–100.0)
MPV: 10 fL (ref 7.5–12.5)
Monocytes Relative: 11.6 %
NEUTROS ABS: 4931 {cells}/uL (ref 1500–7800)
Neutrophils Relative %: 73.6 %
Platelets: 374 10*3/uL (ref 140–400)
RBC: 4.54 10*6/uL (ref 4.20–5.80)
RDW: 13 % (ref 11.0–15.0)
Total Lymphocyte: 14 %
WBC: 6.7 10*3/uL (ref 3.8–10.8)
WBCMIX: 777 {cells}/uL (ref 200–950)

## 2017-03-22 MED ORDER — PREDNISONE 10 MG (21) PO TBPK: ORAL_TABLET | ORAL | 0 refills | Status: DC

## 2017-03-22 MED ORDER — ALBUTEROL SULFATE HFA 108 (90 BASE) MCG/ACT IN AERS: 2.0000 | INHALATION_SPRAY | Freq: Four times a day (QID) | RESPIRATORY_TRACT | 0 refills | Status: DC | PRN

## 2017-03-22 MED ORDER — BACLOFEN 10 MG PO TABS: ORAL_TABLET | ORAL | 0 refills | Status: DC

## 2017-03-22 MED ORDER — DOXYCYCLINE HYCLATE 100 MG PO TABS: 100.0000 mg | ORAL_TABLET | Freq: Two times a day (BID) | ORAL | 0 refills | Status: DC

## 2017-03-22 NOTE — Progress Notes (Signed)
Careteam: Patient Care Team: Kermit Balo, DO as PCP - General (Geriatric Medicine)  Advanced Directive information Does Patient Have a Medical Advance Directive?: No  Allergies  Allergen Reactions  . Codeine   . Demerol   . Disalcid [Salsalate]   . Feldene [Piroxicam]   . Penicillins     Has patient had a PCN reaction causing immediate rash, facial/tongue/throat swelling, SOB or lightheadedness with hypotension: NO Has patient had a PCN reaction causing severe rash involving mucus membranes or skin necrosis: NO Has patient had a PCN reaction that required hospitalization NO Has patient had a PCN reaction occurring within the last 10 years: No If all of the above answers are "NO", then may proceed with Cephalosporin use.  [name] Antibiotics     Chief Complaint  Patient presents with  . Acute Visit    Pt is being seen for cough/chest congestion, nasal congestion x couple weeks.      HPI: Patient is a 66 y.o. male seen in the office today due to cough and congestion for several weeks.  Has had a hx of Pneumonia several times.  Started when he was in high school. Has bronchitis frequently and that's what he thought it was. Hx smoker. Increase shortness of breath.  Coughing up congestion. 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  Reports fever up to 101 yesterday.  Able to keep fluids down, does not eat much.  Nasal and chest congestion.  Reports he was given Cipro for over a week.- had a refill on this from when he was given it in the past.  Also using inhaler that he was given in the past.  Recurrent bronchitis was given a sample but unsure of the name.  Has not seen pulmonary doctor recently- last time was several years.  Can not afford to go to anymore doctor.   Reports he is not on soma. Insurance did not approve this. Request refill on baclofen   Review of Systems:  Review of Systems  Constitutional: Positive for chills, fever and malaise/fatigue.    HENT: Positive for congestion. Negative for sore throat.   Respiratory: Positive for cough, sputum production, shortness of breath and wheezing.   Cardiovascular: Negative for chest pain and palpitations.  Musculoskeletal:       Chronic pain  Neurological: Positive for dizziness (orthostatic ).    Past Medical History:  Diagnosis Date  . 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 Anemia   . Arthritis   . Chronic pain   . 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 DVT (deep venous thrombosis) (HCC)   . Heart murmur   . Hiatal hernia   . Hypothyroidism 12/14/2014  . Impotence   . Other and unspecified hyperlipidemia   . 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 Peripheral vascular disease (HCC)   . Reflux   . Testosterone insufficiency 2013  . Ulcer    Peptic disease  . Unspecified essential hypertension    Past Surgical History:  Procedure Laterality Date  . CARPAL TUNNEL RELEASE Right 1995  . KIDNEY STONE SURGERY    . SPINE SURGERY     Social History:   reports that he has quit smoking. His smoking use included Cigarettes. He has a 0.75 pack-year smoking history. He quit smokeless tobacco use about 9 years ago. He reports that he does not drink alcohol or use drugs.  Family History  Problem Relation Age of Onset  . Hypertension  Mother   . Stroke Father 44    Medications: Patient's Medications  New Prescriptions   No medications on file  Previous Medications   BACLOFEN (LIORESAL) 10 MG TABLET    TAKE 1 TABLET THREE TIMES DAILY AS NEEDED FOR MUSCLE RELAXATION.   DENTA 5000 PLUS 1.1 % CREA DENTAL CREAM    BRUSH ON TEETH FOR 2 MINUTES THEN SWISH AND EXPECTORATE.   DIAZEPAM (VALIUM) 10 MG TABLET    TAKE 1 TABLET UP TO FOUR TIMES DAILY TO RELAX MUSCLES.   GABAPENTIN (NEURONTIN) 300 MG CAPSULE    Take 1 capsule (300 mg total) by mouth 2 (two) times daily.   HYDROCODONE-ACETAMINOPHEN (NORCO) 7.5-325 MG TABLET    Take one tablet every 6 hour if  needed for pain   HYDROMORPHONE (DILAUDID) 8 MG TABLET    Take one tablet every 3 hours to control pain   LEVOTHYROXINE (SYNTHROID, LEVOTHROID) 75 MCG TABLET    Take one tablet by mouth 30 minutes before breakfast for thyroid   LOSARTAN (COZAAR) 50 MG TABLET    One daily to control BP   METHADONE (DOLOPHINE) 10 MG TABLET    Take four tablets every 6 hours to control pain   MINOXIDIL (LONITEN) 10 MG TABLET    TAKE 1 TABLET DAILY TO CONTROL BLOOD PRESSURE.   NITRO-BID 2 % OINTMENT    APPLY 1 INCH STRIP TO FEET TWICE DAILY.   OMEPRAZOLE (PRILOSEC) 20 MG CAPSULE    TAKE (2) CAPSULES TWICE DAILY.   OXYCODONE HCL 10 MG TABS    Take four tablets by mouth every 6 hours as needed for pain   PROCHLORPERAZINE (COMPAZINE) 10 MG TABLET    TAKE 1 TABLET EVERY 8 HOURS AS NEEDED FOR NAUSEA.   TEMAZEPAM (RESTORIL) 30 MG CAPSULE    TAKE 1 CAPSULE ONCE DAILY AT BEDTIME AS NEEDED FOR REST.   TESTOSTERONE (ANDROGEL PUMP) 20.25 MG/ACT (1.62%) GEL    Place 2 Pump onto the skin daily.   TRAMADOL (ULTRAM) 50 MG TABLET    TAKE 1 TABLET UP TO FOUR TIMES DAILY AS NEEDED FOR PAIN.   VOLTAREN 1 % GEL    APPLY 2-4GM TO AFFECTED AREA UP TO 4 TIMES A DAY  Modified Medications   No medications on file  Discontinued Medications   CARISOPRODOL (SOMA) 350 MG TABLET    Take 1 tablet (350 mg total) by mouth 4 (four) times daily as needed for muscle spasms.     Physical Exam:  Vitals:   03/22/17 1126  BP: (!) 146/88  Pulse: 69  Resp: 17  Temp: 98.5 F (36.9 C)  TempSrc: Oral  SpO2: (!) 89%  Weight: 188 lb 3.2 oz (85.4 kg)  Height:  (1.778 m)   Body mass index is 27 kg/m.  Physical Exam  Constitutional: He appears well-developed and well-nourished.  Smells of smoke but states he does not smoke cigarettes and is a former smoker   HENT:  Head: Normocephalic and atraumatic.  Right Ear: External ear normal.  Left Ear: External ear normal.  Nose: Nose normal.  Mouth/Throat: Oropharynx is clear and moist.    Neck: Normal range of motion. Neck supple.  Cardiovascular: Normal rate, regular rhythm and normal heart sounds.   Pulmonary/Chest: Effort normal. He has wheezes (upper lobes). He has rhonchi (throughout).  Skin: Skin is warm. He is diaphoretic.   Labs reviewed: Basic Metabolic Panel:  Recent Labs  16/10/96 0822 09/28/16 1007 12/26/16 1230 01/28/17 0134  NA 134* 138  138  --   K 5.9* 5.0 4.7  --   CL 97* 103 102  --   CO2 27 27 27   --   GLUCOSE 121* 104* 86  --   BUN 32* 22 17  --   CREATININE 1.63* 1.09 1.13  --   CALCIUM 9.4 9.1 9.3  --   TSH  --   --  4.72* 2.76   Liver Function Tests:  Recent Labs  12/26/16 1230  AST 16  ALT 12  ALKPHOS 86  BILITOT 0.3  PROT 6.6  ALBUMIN 4.0    Recent Labs  09/12/16 1455  LIPASE 36  AMYLASE 57   No results for input(s): AMMONIA in the last 8760 hours. CBC: No results for input(s): WBC, NEUTROABS, HGB, HCT, MCV, PLT in the last 8760 hours. Lipid Panel: No results for input(s): CHOL, HDL, LDLCALC, TRIG, CHOLHDL, LDLDIRECT in the last 8760 hours. TSH:  Recent Labs  12/26/16 1230 01/28/17 0134  TSH 4.72* 2.76   A1C: Lab Results  Component Value Date   HGBA1C 5.5 12/26/2016     Assessment/Plan 1. Bronchitis - predniSONE (STERAPRED UNI-PAK 21 TAB) 10 MG (21) TBPK tablet; Use as directed  Dispense: 21 tablet; Refill: 0 - CBC with Differential/Platelets - albuterol (PROVENTIL HFA;VENTOLIN HFA) 108 (90 Base) MCG/ACT inhaler; Inhale 2 puffs into the lungs every 6 (six) hours as needed for wheezing or shortness of breath.  Dispense: 1 Inhaler; Refill: 0 - DG Chest 2 View rule out pneumonia -doxycycline 100 mg by mouth PO BID x 7 days.  -mucinex DM PO BID with full glass of water -increase water intake.  -encouraged deep breathing exercises -return precautions discussed   Next appt: as scheduled, sooner if needed  Lillie Portner K. Biagio BorgEubanks, AGNP  [facility] [phone](Monday-Friday 8 am - 5  pm) [phone] (after hours)

## 2017-03-22 NOTE — Telephone Encounter (Signed)
Ok fill? Pt is taking Soma and Valium also for muscle spasms and to relax muscles. Please advise

## 2017-03-22 NOTE — Telephone Encounter (Signed)
Pt needs treatment at a pain clinic or addiction medicine office.  We can attempt to do the form, but I doubt any insurance will continue to cover that number of pills in this era or opioid misuse.

## 2017-03-22 NOTE — Telephone Encounter (Signed)
Refill denied , see previous refill encounter, pt on multiple muscle relaxers

## 2017-03-22 NOTE — Patient Instructions (Addendum)
To use Mucinex DM by mouth twice daily with full glass of water To start prednisone taper To use albuterol as needed for cough/wheezing/shortness of berath To get chest xray- based on this antibiotic will be prescribed

## 2017-03-22 NOTE — Telephone Encounter (Signed)
Will let Dr Renato Gailseed address this when she returns

## 2017-03-22 NOTE — Telephone Encounter (Signed)
Refill denied and will wait for Dr. Renato Gailseed to address on monday

## 2017-03-25 NOTE — Telephone Encounter (Signed)
Spoke with [facility] and pt paid $53.20 cash for the rx on 03/15/17, pt is taking 16 tablets per day and insurance only allows 6 per day. Prior auth done thru cover my meds

## 2017-03-26 ENCOUNTER — Telehealth: Payer: Self-pay | Admitting: *Deleted

## 2017-03-26 ENCOUNTER — Other Ambulatory Visit: Payer: Self-pay | Admitting: Nurse Practitioner
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
## 2017-03-26 NOTE — Telephone Encounter (Signed)
Per Shanda BumpsJessica- patient should continue to take Mucinex DM twice daily with a full glass of water. Pt should also avoid any irritants such as cigarette/cigar smoke, excessive dust exposure.   I spoke with patient and he verbalized understanding. He did not have any questions at this time.

## 2017-03-27 NOTE — Telephone Encounter (Signed)
Received fax from [employer] and Methadone Quantity Limit APPROVED from 12/26/16-03/26/18 for #480. Member ID#: [id number] [phone]

## 2017-03-28 ENCOUNTER — Other Ambulatory Visit: Payer: Self-pay | Admitting: Internal Medicine

## 2017-03-28 ENCOUNTER — Telehealth: Payer: Self-pay

## 2017-03-28 NOTE — Telephone Encounter (Signed)
Patient called to question why his diazepam medication dosage was decreased. Patient states I have taken diazepam 10 mg four times daily since the age of 66. 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   Per Dr.Reed: patient's dosage was decreased as a safety precaution. Patient expressed dissatisfaction with the given response.   Patient gave the recommendation that we need to call Dr.Green to discuss his history. I informed patient that we have access to his history and he states he is referring to the history from when Dr.Green was located [facility]. Patient informed Dr.Green would not have access to those records and is retired from [employer].   Patient mentioned that Dr.Green told him that he talked with Dr.Reed and she agreed to prescribed all his current medications as is. (Patient has said this multiple times in various conversations with [facility] staff), I informed patient that Dr.Reed was asked to take him on as a patient in which she agreed, yet her prescribing his medication is subject to her expertise and experience as a doctor.   Patient [facility] for he would like dosage re-evaluated   Patient would like for Dr.Reed to give a detailed explanation for this medication change.  Please advise

## 2017-03-28 NOTE — Telephone Encounter (Signed)
Ok to fill at this quantity? 

## 2017-03-28 NOTE — Telephone Encounter (Signed)
rx called into pharmacy, spoke with [facility] and advised change from  to 

## 2017-03-28 NOTE — Telephone Encounter (Signed)
I have reviewed his medication list again.  It includes the following medications all for pain:  Baclofen, valium (both for muscle spasms), gabapentin for nerve pain, hydrocodone, hydromorphone, oxycodone, tramadol, and voltaren.  I calculated his risk of serious opioid-induced respiratory depression or overdose in the next 6 mos on all of this and it's greater than 55%.  I don't feel comfortable with his medication list which is why I've asked him to see a pain management expert.  Unfortunately, he's been turned down by two different practices.  This is why I'm trying to reduce the valium which worsens the chance of respiratory failure from pain medications.  Where do things stand with another attempt at a pain clinic referral?  His next appt with me is not until next month and we addressed an acute concern last visit that I thought was polymyalgia and we used steroid medication as an alternative treatment for his pain.

## 2017-03-28 NOTE — Telephone Encounter (Signed)
Tapering dose

## 2017-04-01 ENCOUNTER — Other Ambulatory Visit: Payer: Self-pay | Admitting: Internal Medicine

## 2017-04-01 NOTE — Telephone Encounter (Signed)
Noted.  He's also welcome to come sooner if there is an appointment available so this can be addressed more promptly.  Anyone on over 100 morphine equivalents per day is at high risk for overdose and death and he's over over 11-22-1997 by my calculations. His drug screen indicated he was taking these medications.  He needs a pain clinic, and, ideally addiction medicine.  I will discuss that with him also when I see him.
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
## 2017-04-01 NOTE — Telephone Encounter (Signed)
.  left message to have patient return my call.  

## 2017-04-04 NOTE — Telephone Encounter (Signed)
Tried calling patient and phone was ringing then someone picked up the phone and then disconnected the call.

## 2017-04-04 NOTE — Telephone Encounter (Signed)
.  left message to have patient return my call.  

## 2017-04-05 ENCOUNTER — Other Ambulatory Visit: Payer: Self-pay | Admitting: *Deleted

## 2017-04-05 DIAGNOSIS — G894 Chronic pain syndrome: Secondary | ICD-10-CM

## 2017-04-05 MED ORDER — HYDROMORPHONE HCL 8 MG PO TABS: 8.0000 mg | ORAL_TABLET | Freq: Four times a day (QID) | ORAL | 0 refills | Status: DC | PRN

## 2017-04-05 MED ORDER — HYDROMORPHONE HCL 8 MG PO TABS: ORAL_TABLET | ORAL | 0 refills | Status: DC

## 2017-04-05 NOTE — Telephone Encounter (Signed)
Called to speak with patient about medications. Per Nicole Cella pt has been accepted in [facility] and they have been trying to reach patient and pt has not been returning their call. I called pt about rx refill request today and to advise that he needs to return the call. Per pt he can not afford pain management and will not go to pain management , per pt he want things to go back to the way Dr. Chilton Si was prescribing his medication. I advised to pt Dr. Chilton Si is no longer his physician. I advised to pt that Dr. Renato Gails would be tapering his medication and he " will not stand for that" he will take further action, I asked what that meant and he didn't say.   Spoke with Dr. Renato Gails about rx refill today and she changed instructions for Hydromorphone from 1 tablet every 3 hours #240 to 1 tablet every 6 hours #120.

## 2017-04-05 NOTE — Telephone Encounter (Signed)
Noted.  Yes, Austin Barnes was informed of two options--seeing pain management which he refused after being accepted to [facility] or continuing with a taper of his controlled substances done by me.  We reduced his valium when he called for its' refill and will now reduce the dilaudid by half.  He cannot possibly be taking these doses of medication based on his risk of respiratory depression and death.  His drug screen did show all of these in his system, however, to some level.  I will continue a gradual dose reduction of the medications to safer levels.  He is expressing clear disagreement with this approach which, in my medical opinion, is necessary and appropriate for him.

## 2017-04-05 NOTE — Telephone Encounter (Signed)
Patient requested and will pick up NCCSRS not checked due to new log in process. Awaiting log in access.

## 2017-04-05 NOTE — Addendum Note (Signed)
Addended by: Sueanne Margarita on: 04/05/2017 09:31 AM   Modules accepted: Orders

## 2017-04-12 ENCOUNTER — Other Ambulatory Visit: Payer: Self-pay | Admitting: Internal Medicine

## 2017-04-12 ENCOUNTER — Other Ambulatory Visit: Payer: Self-pay

## 2017-04-12 DIAGNOSIS — G039 Meningitis, unspecified: Secondary | ICD-10-CM

## 2017-04-12 MED ORDER — TEMAZEPAM 15 MG PO CAPS: ORAL_CAPSULE | ORAL | 0 refills | Status: DC

## 2017-04-12 MED ORDER — METHADONE HCL 10 MG PO TABS: 40.0000 mg | ORAL_TABLET | Freq: Four times a day (QID) | ORAL | 0 refills | Status: DC | PRN

## 2017-04-12 MED ORDER — OXYCODONE HCL 30 MG PO TABS: 30.0000 mg | ORAL_TABLET | Freq: Four times a day (QID) | ORAL | 0 refills | Status: DC | PRN

## 2017-04-12 MED ORDER — HYDROCODONE-ACETAMINOPHEN 5-325 MG PO TABS: 1.0000 | ORAL_TABLET | Freq: Four times a day (QID) | ORAL | 0 refills | Status: DC | PRN

## 2017-04-12 NOTE — Telephone Encounter (Signed)
Pt called and insisted that he speak with Dr Renato Gails directly. He would not give a reason when I told him that I would sent provider a message due to provider not being in office. He again insisted that he speak directly to Dr. Renato Gails

## 2017-04-12 NOTE — Telephone Encounter (Signed)
I have separately made the adjustments to Austin Barnes medications as he was informed would occur as a compromise since he did not want to see pain management or addiction medicine which were my preferred recommendations.

## 2017-04-12 NOTE — Progress Notes (Signed)
I have made adjustments to Mr. Busta extensive opioid therapy gradually reducing the doses of his medications since he clearly cannot be taking this amount of medication and functioning.  This time the changes were as follows: Continue methadone as is. I would prefer he be on this alone in the end for his pain mgt since it can be used to treat addiction at pain clinics.  Reduce temazepam from  qhs prn to  qhs prn insomnia #30 Reduce hydrocodone to 5/325mg  po q6 h prn moderate pain (from 7.5/325mg ) #120 Change oxycodone from  (4 of the  pills) every 6 hrs as needed for pain to  every 6 hrs as needed for pain (also changed from using  pills to  pills)--no point in dispensing more pills.  #120 At his last prescription request, it was explained that his medications will be gradually tapered by me since he is refusing to accept care at the pain clinic or attend addiction medicine counseling. I also offered for him to come in for a sooner appointment to discuss this but he refused and said he may not come back at all.  I am doing my best to reduce this gentleman's medication gradually.   Tywon Niday L. Darsha Zumstein, D.O. Geriatrics [facility] [address], [place] [address] Cell Phone (Mon-Fri 8am-5pm):  [phone] On Call:  [phone] & follow prompts after 5pm & weekends Office Phone:  [phone] Office Fax:  [phone]

## 2017-04-12 NOTE — Telephone Encounter (Signed)
I spoke with patient to let him know that some of his medication dosages had been adjusted by Dr. Renato Gails. He was extremely angry about this and he insisted that I call Dr. Chilton Si so patient could get his regular doses of medications. I informed patient that Dr Chilton Si no longer works at this office and his new PCP was uncomfortable prescribing the large amount of controlled substances that patient had been getting. Patient was given the option of going to pain management, which he has refused on numerous occasions. Patient denies that he has refused pain management.   Patient then stated that he would go to pain management if we could find one for him. I informed patient [facility] has made several attempts to contact him to schedule an appointment. Patient denied ever getting any calls from any pain management clinics.   I gave patient the name and number of [facility] and told him to ask for [facility]. Patient reluctantly agreed to call them. He then asked that if he goes to pain management, would Dr. Renato Gails begin giving him the increased doses of his medications again. I explained to patient that once he has established with pain management, then that provider would provide pain medications. This angered patient and he stated that he would just deal with the medication reductions and hoped that he would still be around for his appointment with Dr Renato Gails on Apr 29, 2017.   I suggested that patient call and schedule an appointment with pain management. He hung up.

## 2017-04-16 ENCOUNTER — Telehealth: Payer: Self-pay | Admitting: *Deleted

## 2017-04-16 ENCOUNTER — Other Ambulatory Visit: Payer: Self-pay | Admitting: Internal Medicine

## 2017-04-16 NOTE — Telephone Encounter (Signed)
[facility] called and stated that patient has gotten Androgel Pump 2 pumps daily for a long time. Today a Rx was called in for 1 Pump daily and pharmacist wanted to confirm this is correct since patient has been on 2 pumps daily. Medication list says 1. Please Advise.

## 2017-04-16 NOTE — Telephone Encounter (Signed)
Androgel called in 1 pump QD based on last lab results by Dr. Renato Gails.

## 2017-04-17 NOTE — Telephone Encounter (Signed)
July 16th the dosage was reduced and lab notes indicate that pt was informed of that change.

## 2017-04-17 NOTE — Telephone Encounter (Signed)
Pharmacy notified.

## 2017-04-18 ENCOUNTER — Other Ambulatory Visit: Payer: Self-pay | Admitting: Internal Medicine

## 2017-04-18 MED ORDER — TRAMADOL HCL 50 MG PO TABS: 50.0000 mg | ORAL_TABLET | Freq: Two times a day (BID) | ORAL | 0 refills | Status: DC | PRN

## 2017-04-18 NOTE — Telephone Encounter (Signed)
Ok to fill at this dose.

## 2017-04-18 NOTE — Telephone Encounter (Signed)
Reducing tramadol from four to two times daily as needed.

## 2017-04-18 NOTE — Addendum Note (Signed)
Addended by: Sueanne Margarita on: 04/18/2017 02:11 PM   Modules accepted: Orders

## 2017-04-18 NOTE — Telephone Encounter (Addendum)
rx called into pharmacy

## 2017-04-21 ENCOUNTER — Other Ambulatory Visit: Payer: Self-pay | Admitting: Internal Medicine

## 2017-04-21 DIAGNOSIS — G8929 Other chronic pain: Secondary | ICD-10-CM

## 2017-04-22 ENCOUNTER — Other Ambulatory Visit: Payer: Self-pay | Admitting: Internal Medicine

## 2017-04-22 DIAGNOSIS — G8929 Other chronic pain: Secondary | ICD-10-CM

## 2017-04-22 NOTE — Telephone Encounter (Signed)
Ok  [place]?

## 2017-04-22 NOTE — Telephone Encounter (Signed)
Yes

## 2017-04-22 NOTE — Telephone Encounter (Signed)
Dose was changed and refill was already done on 04/18/2017.

## 2017-04-25 ENCOUNTER — Telehealth (INDEPENDENT_AMBULATORY_CARE_PROVIDER_SITE_OTHER): Payer: Self-pay | Admitting: Orthopedic Surgery

## 2017-04-25 NOTE — Telephone Encounter (Signed)
Patient called inquiring if we had records he had bought in from Dr. Carles Collet, specifically MRI report. I told him that we don't have anything, that most likely it's pending being scanned into his chart. Advised him, that he could contact the facility he had MRI done at to get copy of report and images if he needs.

## 2017-04-29 ENCOUNTER — Telehealth: Payer: Self-pay | Admitting: *Deleted

## 2017-04-29 ENCOUNTER — Ambulatory Visit: Payer: Medicare Other | Admitting: Internal Medicine

## 2017-04-29 ENCOUNTER — Other Ambulatory Visit: Payer: Self-pay | Admitting: Internal Medicine

## 2017-04-29 ENCOUNTER — Other Ambulatory Visit: Payer: Self-pay | Admitting: *Deleted

## 2017-04-29 DIAGNOSIS — I10 Essential (primary) hypertension: Secondary | ICD-10-CM

## 2017-04-29 MED ORDER — DIAZEPAM 2 MG PO TABS: 2.0000 mg | ORAL_TABLET | Freq: Four times a day (QID) | ORAL | 0 refills | Status: DC | PRN

## 2017-04-29 MED ORDER — LOSARTAN POTASSIUM 50 MG PO TABS: ORAL_TABLET | ORAL | 0 refills | Status: DC

## 2017-04-29 NOTE — Telephone Encounter (Signed)
Patient requested refill on his Valium. Is this ok to refill with same dosage and instructions. Please Advise.

## 2017-04-29 NOTE — Telephone Encounter (Signed)
Printed new Rx for pt to pick up tomorrow. See separate note.  Thanks.

## 2017-04-29 NOTE — Telephone Encounter (Signed)
Spoke with Pharmacist at [facility] and called in BP medication. Thyroid medication does not need a refill, has 34 more days and message sent to Dr. Renato Gails regarding Valium refill.

## 2017-04-29 NOTE — Progress Notes (Signed)
Will reduce dose to valium  now every 6 hrs as needed for muscle spasms.  #120.  Prescription will be printed for him to pick up 04/30/17 when office reopens. Emory Gallentine L. Ophelia Sipe, D.O. Geriatrics [facility] [address], [place] [address] Cell Phone (Mon-Fri 8am-5pm):  [phone] On Call:  [phone] & follow prompts after 5pm & weekends Office Phone:  [phone] Office Fax:  [phone]

## 2017-04-30 ENCOUNTER — Encounter: Payer: Self-pay | Admitting: Internal Medicine
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
## 2017-04-30 NOTE — Telephone Encounter (Signed)
Austin Barnes  04/29/2017  Orders Only  MRN:  [id number]  Description: 66 year old male Provider: Kermit Balo, DO Department: [facility]  Progress Notes   Kermit Balo, DO at 04/29/2017 3:44 PM   Status: Signed    Will reduce dose to valium  now every 6 hrs as needed for muscle spasms.  #120.  Prescription will be printed for him to pick up 04/30/17 when office reopens. Tiffany L. Reed, D.O. Geriatrics [facility] [address] [place], [place] [address] Cell Phone (Mon-Fri 8am-5pm):  [phone] On Call:  [phone] & follow prompts after 5pm & weekends Office Phone:  [phone] Office Fax:  [phone]      Medications Ordered This Encounter    Disp Refills Start End  diazepam (VALIUM) 2 MG tablet 120 tablet 0 04/29/2017   Take 1 tablet (2 mg total) by mouth every 6 (six) hours as needed for muscle spasms. - Oral  Discontinued Medications    Reason for Discontinue  diazepam (VALIUM) 5 MG tablet Change in therapy  Orders   Order Information   Orders Report MAR History by Medication      ---------Recieved fax from [facility] pharmacy stating Patient is requesting 10 mg of Diazepam instead of . I called [facility] and spoke with Pharmacist and gave Dr. Ernest Mallick new Valium dosage and instructions over phone. Medication decreased.

## 2017-04-30 NOTE — Telephone Encounter (Signed)
Patient called and left message on Clinical Intake line wanting to know why Dr. Renato Gails Decreased his Valium to . Stated that he has been on  since 1971. Stated that he will not do any good at all on this dose. Stated that Dr. Renato Gails don't even know what's wrong with him, stated he had an appointment and Dr. Ernest Mallick not even here. Patient stated he wants a reason why she is decreasing his medications. Please Advise.

## 2017-05-01 NOTE — Telephone Encounter (Signed)
[facility] and for patient to call to reschedule appointment with Dr. Renato Gailseed.

## 2017-05-01 NOTE — Telephone Encounter (Signed)
She has done a slow titrated the medication due to the amount he is taking- again we can also offer pain management or the addiction specialist. Thank you.

## 2017-05-01 NOTE — Telephone Encounter (Signed)
Patient called back and I advised we needed to reschedule his next appt, per pt he wanted to know first why Dr. Renato Gailseed reduced his valium from 10 mg to 2mg  . I read the response from Dr. Renato Gailseed and from [facility] and pt got upset and would not reschedule his next appt because per pt " I will be in the grave". Pt declined appt and disconnected the call.

## 2017-05-02 ENCOUNTER — Other Ambulatory Visit: Payer: Self-pay | Admitting: *Deleted

## 2017-05-02 ENCOUNTER — Telehealth: Payer: Self-pay

## 2017-05-02 MED ORDER — MINOXIDIL 10 MG PO TABS: ORAL_TABLET | ORAL | 1 refills | Status: DC

## 2017-05-02 MED ORDER — LEVOTHYROXINE SODIUM 75 MCG PO TABS: ORAL_TABLET | ORAL | 1 refills | Status: AC

## 2017-05-02 NOTE — Telephone Encounter (Signed)
Patient called clinical intake to inquire about Losartan RX that was approved on 04/29/17, patient states he has not taking losartan in a long time, Dr.Green took him off losartan when he put him on minoxidil 10mg   I reviewed chart and informed patient when medication list was reviewed at last OV he did not indicate that he was no longer taking losartan, patient admits to not thoroughly looking at medication list or informing medical assistant that he was no longer taking. Losartan was also on medication list when patient last seen Dr.Green in June 2018.  Patient states regardless of what was/is on medication list he has not taking losartan in a long time and it needs to be removed from his current medication list, patient needs refill on minoxidil and levothyroxine, [facility].  [facility] to have them remove Losartan from his account.

## 2017-05-06 ENCOUNTER — Ambulatory Visit (INDEPENDENT_AMBULATORY_CARE_PROVIDER_SITE_OTHER): Payer: Medicare Other | Admitting: Internal Medicine

## 2017-05-06 ENCOUNTER — Encounter: Payer: Self-pay | Admitting: Internal Medicine

## 2017-05-06 VITALS — BP 160/80 | HR 94 | Temp 98.3°F | Wt 183.0 lb

## 2017-05-06 DIAGNOSIS — Z79899 Other long term (current) drug therapy: Secondary | ICD-10-CM

## 2017-05-06 DIAGNOSIS — I739 Peripheral vascular disease, unspecified: Secondary | ICD-10-CM

## 2017-05-06 DIAGNOSIS — E039 Hypothyroidism, unspecified: Secondary | ICD-10-CM | POA: Diagnosis not present

## 2017-05-06 DIAGNOSIS — G039 Meningitis, unspecified: Secondary | ICD-10-CM | POA: Diagnosis not present

## 2017-05-06 DIAGNOSIS — E349 Endocrine disorder, unspecified: Secondary | ICD-10-CM

## 2017-05-06 DIAGNOSIS — Z23 Encounter for immunization: Secondary | ICD-10-CM | POA: Diagnosis not present

## 2017-05-06 DIAGNOSIS — E1151 Type 2 diabetes mellitus with diabetic peripheral angiopathy without gangrene: Secondary | ICD-10-CM

## 2017-05-06 DIAGNOSIS — G894 Chronic pain syndrome: Secondary | ICD-10-CM | POA: Diagnosis not present

## 2017-05-06 MED ORDER — HYDROMORPHONE HCL 8 MG PO TABS: 8.0000 mg | ORAL_TABLET | Freq: Four times a day (QID) | ORAL | 0 refills | Status: DC | PRN

## 2017-05-06 MED ORDER — METHADONE HCL 10 MG PO TABS
40.0000 mg | ORAL_TABLET | Freq: Four times a day (QID) | ORAL | 0 refills | Status: DC | PRN
Start: 2017-05-06 — End: 2017-05-06

## 2017-05-06 MED ORDER — METHADONE HCL 10 MG PO TABS: 40.0000 mg | ORAL_TABLET | Freq: Four times a day (QID) | ORAL | 0 refills | Status: DC | PRN

## 2017-05-06 NOTE — Telephone Encounter (Signed)
He has refused to keep appts made at pain mgt.  He is being seen today.

## 2017-05-06 NOTE — Progress Notes (Signed)
Location:  [facility] clinic Provider:  Masahiro Iglesia L. Renato Gails, D.O., C.M.D.  Code Status: full code Goals of Care:  Advanced Directives 03/22/2017  Does Patient Have a Medical Advance Directive? No  Type of Advance Directive -  Does patient want to make changes to medical advance directive? -  Copy of Healthcare Power of Attorney in Chart? -  Would patient like information on creating a medical advance directive? -   Chief Complaint  Patient presents with  . Medical Management of Chronic Issues    discuss medications    HPI: Patient is a 66 y.o. male seen today for medical management of chronic diseases.  He has adhesive arachnoiditis.  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   I have seen him once since Dr. Thomasene Lot retirement.  He's on over 2000 morphine milli equivalents per day and we've been attempting to taper his opioids and benzodiazepines.  He has refused to see pain management after a referral was placed.  He had one drug screen with me thus far that showed he was indeed taking all of the medications as prescribed.   Says the owner of his drug store recommended the 8mg  dilaudid.  Ultram took place of propoxyphene. He says that it used to help his arthritis.  The hydrocodone got added to it by Dr. Chilton Si per patient.    Says he does not have circulation except 1/2 inch in his feet.  It burns and stings and he cannot sleep.  He wakes up 15 mins later.  He says he's not taking the gabapentin that was ordered in 2/18.  Uses nitrobid for that.    He has chronic pancreatitis so he cannot take hctz.  I had lowered his temazepam.  Used to be on phenobarbital.    Has been going on since 1970.  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  Senior year had double pneumonia and he was out for a month.  He was on valium from Dr. Lorn Junes in 1971 at 10mg .  I  now have him on 2mg .  Says everything is worse since I've lowered his dose--it feels like he's been beaten with a bat.  It took him longer than it should.   He mentions his degeneration of his shoulders, spinal stenosis, degenerative discs.  Reviewed MRIs.    I reviewed with him my concerns about his pain medication dosages.  Says he had 9 operations at [facility] related to a nerve stimulator.  At [place], he was started on methadone. Dr. Chilton Si raised it once in the mid-80s.  He keeps his medication locked up.  He says he can't take it anymore, and starts to cry.  Says he does not have much of a life.    He falls all of the time due to his right leg giving out after prior fractures years ago.  It took 8 mos to get back on his feet.    Says he was having hair loss and his testosterone level was low then.  He is on minoxidil for bp and his hair.    BP elevated today at 160/80, but better than it used to be.  He says it's been over 200, etc in the past.    Uses voltaren gel for his shoulders and right hip and leg where arthritis bothers him.  GERD: had anterior fusion surgery (says 13 fusions) and on backside.  Had a large ball of scar tissue from ulcers, he says.  Had multiple EGDs.  Dr. Virginia Rochesterrr followed him and then Dr. Orpah GreekAbrams.  It took a long time for his intestines to start working.  Hypothyroidism:  Has been controlled with current dose of levothyroxine.    Says his excruciating pain is coming back.  Says he was told by the "[facility]" to take his dilaudid very 3 hrs not every 6 hrs.  Not due yet.  Explained that I would prefer he be managed at Pain Management for his pain not here at [facility].  He has refused to go to [facility].   Past Medical History:  Diagnosis Date  . 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 Anemia   . Arthritis   . Chronic pain   . 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 DVT (deep venous thrombosis) (HCC)   . Heart  murmur   . Hiatal hernia   . Hypothyroidism 12/14/2014  . Impotence   . Other and unspecified hyperlipidemia   . 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 Peripheral vascular disease (HCC)   . Reflux   . Testosterone insufficiency 2013  . Ulcer    Peptic disease  . Unspecified essential hypertension     Past Surgical History:  Procedure Laterality Date  . CARPAL TUNNEL RELEASE Right 1995  . KIDNEY STONE SURGERY    . SPINE SURGERY      Allergies  Allergen Reactions  . Codeine   . Demerol   . Disalcid [Salsalate]   . Feldene [Piroxicam]   . Penicillins     Has patient had a PCN reaction causing immediate rash, facial/tongue/throat swelling, SOB or lightheadedness with hypotension: NO Has patient had a PCN reaction causing severe rash involving mucus membranes or skin necrosis: NO Has patient had a PCN reaction that required hospitalization NO Has patient had a PCN reaction occurring within the last 10 years: No If all of the above answers are "NO", then may proceed with Cephalosporin use.  . Sulfa Antibiotics     Outpatient Encounter Prescriptions as of 05/06/2017  Medication Sig  . albuterol (PROVENTIL HFA;VENTOLIN HFA) 108 (90 Base) MCG/ACT inhaler Inhale 2 puffs into the lungs every 6 (six) hours as needed for wheezing or shortness of breath.  . baclofen (LIORESAL) 10 MG tablet TAKE 1 TABLET THREE TIMES DAILY AS NEEDED FOR MUSCLE RELAXATION.  . diazepam (VALIUM) 2 MG tablet Take 1 tablet (2 mg total) by mouth every 6 (six) hours as needed for muscle spasms.  Marland Kitchen. gabapentin (NEURONTIN) 300 MG capsule Take 1 capsule (300 mg total) by mouth 2 (two) times daily.  Marland Kitchen. HYDROcodone-acetaminophen (NORCO/VICODIN) 5-325 MG tablet Take 1 tablet by mouth every 6 (six) hours as needed for moderate pain.  Marland Kitchen. HYDROmorphone (DILAUDID) 8 MG tablet Take 1 tablet (8 mg total) by mouth every 6 (six) hours as needed for severe pain.  Marland Kitchen. levothyroxine (SYNTHROID, LEVOTHROID) 75 MCG tablet Take one  tablet by mouth 30 minutes before breakfast for thyroid  . methadone (DOLOPHINE) 10 MG tablet Take 4 tablets (40 mg total) by mouth every 6 (six) hours as needed for severe pain. Take four tablets every 6 hours to control pain  . minoxidil (LONITEN) 10 MG tablet TAKE 1 TABLET DAILY TO CONTROL BLOOD PRESSURE.  Marland Kitchen. NITRO-BID 2 % ointment APPLY 1 INCH STRIP TO FEET TWICE DAILY.  .Marland Kitchen  omeprazole (PRILOSEC) 20 MG capsule TAKE (2) CAPSULES TWICE DAILY.  Marland Kitchen oxycodone (ROXICODONE) 30 MG immediate release tablet Take 1 tablet (30 mg total) by mouth every 6 (six) hours as needed for pain.  Marland Kitchen prochlorperazine (COMPAZINE) 10 MG tablet TAKE 1 TABLET EVERY 8 HOURS AS NEEDED FOR NAUSEA.  Marland Kitchen temazepam (RESTORIL) 15 MG capsule TAKE 1 CAPSULE ONCE DAILY AT BEDTIME AS NEEDED FOR REST.  Marland Kitchen Testosterone (ANDROGEL PUMP) 20.25 MG/ACT (1.62%) GEL Apply 1 Pump topically daily.  . traMADol (ULTRAM) 50 MG tablet Take 1 tablet (50 mg total) by mouth every 12 (twelve) hours as needed (mild pain).  . VOLTAREN 1 % GEL APPLY 2-4GM TO AFFECTED AREA UP TO 4 TIMES A DAY  . [DISCONTINUED] doxycycline (VIBRA-TABS) 100 MG tablet Take 1 tablet (100 mg total) by mouth 2 (two) times daily.  . [DISCONTINUED] predniSONE (STERAPRED UNI-PAK 21 TAB) 10 MG (21) TBPK tablet Use as directed   No facility-administered encounter medications on file as of 05/06/2017.     Review of Systems:  Review of Systems  Constitutional: Positive for malaise/fatigue. Negative for chills and fever.  HENT: Negative for congestion.   Eyes: Negative for blurred vision.  Respiratory: Negative for shortness of breath.   Cardiovascular: Negative for chest pain, palpitations and leg swelling.  Gastrointestinal: Negative for abdominal pain.  Genitourinary: Negative for dysuria.  Musculoskeletal: Positive for falls.       Generalized pain, shoulder joint pain right greater than left with normal ROM  Skin: Negative for itching and rash.  Neurological: Positive for  tingling and sensory change. Negative for loss of consciousness and weakness.  Endo/Heme/Allergies: Does not bruise/bleed easily.  Psychiatric/Behavioral: Positive for depression. Negative for memory loss and suicidal ideas. The patient is nervous/anxious and has insomnia.     Health Maintenance  Topic Date Due  . URINE MICROALBUMIN  04/02/1962  . HIV Screening  04/03/1967  . FOOT EXAM  12/14/2015  . INFLUENZA VACCINE  02/13/2017  . PNA vac Low Risk Adult (2 of 2 - PPSV23) 04/02/2017  . OPHTHALMOLOGY EXAM  07/16/2018 (Originally [birth year]/02/1962)  . COLONOSCOPY  07/16/2018 (Originally 04/02/2002)  . TETANUS/TDAP  07/16/2018 (Originally 04/03/1971)  . HEMOGLOBIN A1C  06/27/2017  . Hepatitis C Screening  Completed    Physical Exam: Vitals:   05/06/17 1310  BP: (!) 160/80  Pulse: 94  Temp: 98.3 F (36.8 C)  TempSrc: Oral  SpO2: 97%  Weight: 183 lb (83 kg)   Body mass index is 26.26 kg/m. Physical Exam  Constitutional: He is oriented to person, place, and time. No distress.  Chronically ill appearing white male  HENT:  Head: Normocephalic and atraumatic.  Cardiovascular: Normal rate, regular rhythm, normal heart sounds and intact distal pulses.   Pulmonary/Chest: Effort normal and breath sounds normal. No respiratory distress.  Musculoskeletal: Normal range of motion.  Tenderness of right shoulder worse with abduction  Neurological: He is alert and oriented to person, place, and time. No cranial nerve deficit.  Skin: There is pallor.    Labs reviewed: Basic Metabolic Panel:  Recent Labs  81/19/14 0822 09/28/16 1007 12/26/16 1230 01/28/17 0134  NA 134* 138 138  --   K 5.9* 5.0 4.7  --   CL 97* 103 102  --   CO2 27 27 27   --   GLUCOSE 121* 104* 86  --   BUN 32* 22 17  --   CREATININE 1.63* 1.09 1.13  --   CALCIUM 9.4 9.1 9.3  --  TSH  --   --  4.72* 2.76   Liver Function Tests:  Recent Labs  12/26/16 1230  AST 16  ALT 12  ALKPHOS 86  BILITOT 0.3  PROT 6.6    ALBUMIN 4.0    Recent Labs  09/12/16 1455  LIPASE 36  AMYLASE 57   No results for input(s): AMMONIA in the last 8760 hours. CBC:  Recent Labs  03/22/17 1202  WBC 6.7  NEUTROABS 4,931  HGB 12.0*  HCT 37.0*  MCV 81.5  PLT 374   Lipid Panel: No results for input(s): CHOL, HDL, LDLCALC, TRIG, CHOLHDL, LDLDIRECT in the last 8760 hours. Lab Results  Component Value Date   HGBA1C 5.5 12/26/2016    Assessment/Plan 1. Adhesive arachnoiditis - after back surgeries in his 26s, he developed this condition -has been on chronic opioid therapy for almost that long per pt -has been seen historically at [facility], [facility] and [facility], he reports, and the medications were started by the specialists there and previous PCP had agreed to fill all of them -as of today, pt is on diazepam, norco, dilaudid, methadone, oxycodone, temazepam, nitrobid for feet, tramadol, and voltaren gel all "as needed" but taken routinely -I have been gradually tapering these medications b/c I do not feel comfortable prescribing this volume of medications -pt has no signs of respiratory depression/hypercarbia to suggest he's really taking this many opioids - will again check drug screen (previous showed some of each of these meds in his system) - pt spent majority of visit justifying his need for these medications and insisting that Dr. Chilton Si said he spoke to me and I had agreed to prescribe these medications which, of course, never occurred -I have previously advised him of his risk of respiratory depression and death -I have referred him to several different pain clinics, but he has refused to answer the phone or follow through with these appts (most recently [facility]) -will continue to taper and potentially stop the less potent medications as pt should not withdraw with the large amts of methadone still in his system -will plan to get down to just methadone routinely with hydrocodone for breakthrough -NCCSRS database is  checked before each refill to ensure he is not getting his medications from other providers or pharmacies -I've also spoken with the pharmacy about his medication reduction plan - Drug Tox Monitor 1 w/Conf, Oral Fld  2. 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 Chronic pain syndrome -ongoing, see #1, does have right shoulder pain, as well - Drug Tox Monitor 1 w/Conf, Oral Fld  4. Peripheral vascular disease (HCC) -per records, last ABI was 05/28/18:  Reduction in ankle brachial index at rest on the right with segmental pressure and Doppler analysis revealing predominant pattern of tibial occlusive disease.  This could be further confirmed with a more direct arterial imaging studies such as CT angiography, if the patients symptoms warrant further workup.  The left lower extremity shows no evidence of occlusive disease. -pt reports having had gangrene, but did not require amputation at that time  5. Testosterone insufficiency - currently at insufficiency level, testosterone dose was reduced and I've asked him to come back in for a level in the am to assess if this is still the best dose for him - Testosterone Total,Free,Bio,  Males; Future  6. Hypothyroidism, unspecified type Lab Results  Component Value Date   TSH 2.76 01/28/2017  -cont levothyroxine daily   7. High risk medication use - cont regular monitoring per [facility] guidelines - Drug Tox Monitor 1 w/Conf, Oral Fld  8. Need for influenza vaccination - Flu vaccine HIGH DOSE PF (Fluzone High dose)  Labs/tests ordered:   Orders Placed This Encounter  Procedures  . Flu vaccine HIGH DOSE PF (Fluzone High dose)  . Testosterone Total,Free,Bio, Males    Standing Status:   Future    Standing Expiration Date:   05/06/2018  . Drug Tox  Monitor 1 w/Conf, Oral Fld   Next appt:  07/25/2017  Sommer Spickard L. Amaia Lavallie, D.O. Geriatrics [facility] [address] [place], [place] [address] Cell Phone (Mon-Fri 8am-5pm):  [phone] On Call:  [phone] & follow prompts after 5pm & weekends Office Phone:  [phone] Office Fax:  [phone]

## 2017-05-06 NOTE — Patient Instructions (Addendum)
I recommend you see [facility] to manage your pain.  If you stay here, I will continue to taper your doses of medications to safer levels.  See the attached article about high doses of opioids.

## 2017-05-10 ENCOUNTER — Other Ambulatory Visit: Payer: Self-pay | Admitting: Nurse Practitioner

## 2017-05-10 ENCOUNTER — Telehealth: Payer: Self-pay | Admitting: *Deleted

## 2017-05-10 DIAGNOSIS — F5101 Primary insomnia: Secondary | ICD-10-CM

## 2017-05-10 DIAGNOSIS — G039 Meningitis, unspecified: Secondary | ICD-10-CM

## 2017-05-10 DIAGNOSIS — G894 Chronic pain syndrome: Secondary | ICD-10-CM

## 2017-05-10 MED ORDER — HYDROCODONE-ACETAMINOPHEN 5-325 MG PO TABS: 1.0000 | ORAL_TABLET | Freq: Four times a day (QID) | ORAL | 0 refills | Status: DC | PRN

## 2017-05-10 MED ORDER — TEMAZEPAM 7.5 MG PO CAPS: ORAL_CAPSULE | ORAL | 0 refills | Status: DC

## 2017-05-10 MED ORDER — METHADONE HCL 10 MG PO TABS: 40.0000 mg | ORAL_TABLET | Freq: Four times a day (QID) | ORAL | 0 refills | Status: DC | PRN

## 2017-05-10 NOTE — Telephone Encounter (Signed)
Patient aware of Dr.Reed's response. Patient was very disappointment with the discontinuing of Oxycodone and states he is taking legal action, patient states tell Dr.Reed to look out in the new paper.  Patient will pick up other rx's

## 2017-05-10 NOTE — Telephone Encounter (Signed)
Upon review of Austin Barnes's labs, I determined that he could not be taking all of these medications in these doses (not retaining CO2).  We discussed at his appointment that I would continue to taper him off of his controlled substances.  Due to the fact that he will not withdraw with continue high doses of opioids in his system, I will make more changes today.  He just received the dilaudid at his visit.    The following changes were made when I printed his Rxs today after CMA checked the [employer] database. 1.  Continue hydrocodone 5/325mg  1 po q 6 hrs prn breakthrough pain #120. 2.  Continue methadone 10mg  4 tabs every 6 hours prn severe pain #480. 3.  Discontinue oxycodone. 4.  Decrease temazepam to 7.5mg  po qhs prn insomnia #30.  Cher Franzoni L. Torrance Stockley, D.O. Geriatrics [facility] [address] [place], [place] [address] Cell Phone (Mon-Fri 8am-5pm):  [phone] On Call:  [phone] & follow prompts after 5pm & weekends Office Phone:  [phone] Office Fax:  [phone]

## 2017-05-10 NOTE — Telephone Encounter (Signed)
Patient is calling requesting his Pain Medications. I reviewed last OV Note (not closed) and in patients instructions it stated "I recommend you see [facility] to manage your pain. If you stay here, I will continue to taper your doses of medications to safer levels. See the attached article about high doses of opioids."  I Verified the NCCSRS Database Patient is requesting: 1. Hydrocodone 5/325mg  #120 LF 9/28 2. Methadone 10mg  #480 LF 9/28 3. Oxycodone 30mg  #120 LF 9/28 4. Temazpam 15mg  #30 LF 9/28  Before I print the Rx's are there any changes that need to be done. Please Advise.

## 2017-05-11 LAB — DRUG TOX MONITOR 1 W/CONF, ORAL FLD
Alprazolam: NEGATIVE ng/mL (ref <0.50)
Amphetamines: NEGATIVE ng/mL (ref <10)
Barbiturates: NEGATIVE ng/mL (ref <10)
Benzodiazepines: POSITIVE ng/mL — AB (ref <0.50)
Buprenorphine: NEGATIVE ng/mL (ref <0.10)
Carisoprodol: NEGATIVE ng/mL (ref <2.5)
Chlordiazepoxide: NEGATIVE ng/mL (ref <0.50)
Clonazepam: NEGATIVE ng/mL (ref <0.50)
Cocaine: NEGATIVE ng/mL (ref <5.0)
Codeine: NEGATIVE ng/mL (ref <2.5)
Cotinine: 50 ng/mL — ABNORMAL HIGH (ref <5.0)
Diazepam: 3.86 ng/mL — ABNORMAL HIGH (ref <0.50)
Dihydrocodeine: 3.3 ng/mL — ABNORMAL HIGH (ref <2.5)
EDDP: 24.7 ng/mL — ABNORMAL HIGH (ref <5.0)
Fentanyl: NEGATIVE ng/mL (ref <0.10)
Flunitrazepam: NEGATIVE ng/mL (ref <0.50)
Flurazepam: NEGATIVE ng/mL (ref <0.50)
Heroin Metabolite: NEGATIVE ng/mL (ref <1.0)
Hydrocodone: 19.9 ng/mL — ABNORMAL HIGH (ref <2.5)
Hydromorphone: 7.1 ng/mL — ABNORMAL HIGH (ref <2.5)
Lorazepam: NEGATIVE ng/mL (ref <0.50)
MARIJUANA: NEGATIVE ng/mL (ref <2.5)
MDMA: NEGATIVE ng/mL (ref <10)
Meprobamate: 210.9 ng/mL — ABNORMAL HIGH (ref <2.5)
Meprobamate: POSITIVE ng/mL — AB (ref <2.5)
Methadone: >500 ng/mL — ABNORMAL HIGH (ref <5.0)
Methadone: POSITIVE ng/mL — AB (ref <5.0)
Midazolam: NEGATIVE ng/mL (ref <0.50)
Morphine: NEGATIVE ng/mL (ref <2.5)
Nicotine Metabolite: POSITIVE ng/mL — AB (ref <5.0)
Nordiazepam: 10.98 ng/mL — ABNORMAL HIGH (ref <0.50)
Norhydrocodone: 13.5 ng/mL — ABNORMAL HIGH (ref <2.5)
Noroxycodone: 214.3 ng/mL — ABNORMAL HIGH (ref <2.5)
Opiates: POSITIVE ng/mL — AB (ref <2.5)
Oxazepam: 2.06 ng/mL — ABNORMAL HIGH (ref <0.50)
Oxycodone: 84.5 ng/mL — ABNORMAL HIGH (ref <2.5)
Oxymorphone: 3.9 ng/mL — ABNORMAL HIGH (ref <2.5)
Phencyclidine: NEGATIVE ng/mL (ref <10)
Tapentadol: NEGATIVE ng/mL (ref <5.0)
Temazepam: 3.19 ng/mL — ABNORMAL HIGH (ref <0.50)
Tramadol: >500 ng/mL — ABNORMAL HIGH (ref <5.0)
Tramadol: POSITIVE ng/mL — AB (ref <5.0)
Triazolam: NEGATIVE ng/mL (ref <0.50)
Zolpidem: NEGATIVE ng/mL (ref <5.0)

## 2017-05-15 ENCOUNTER — Other Ambulatory Visit: Payer: Self-pay | Admitting: Internal Medicine

## 2017-05-17 ENCOUNTER — Other Ambulatory Visit: Payer: Self-pay | Admitting: Internal Medicine

## 2017-05-20 ENCOUNTER — Other Ambulatory Visit: Payer: Self-pay | Admitting: Internal Medicine

## 2017-05-20 NOTE — Telephone Encounter (Signed)
Ok to fill 

## 2017-05-20 NOTE — Telephone Encounter (Signed)
Discontinue tramadol.  He has several other medications of similar function for his pain.  This one is adding very little if any benefit.

## 2017-05-21 NOTE — Telephone Encounter (Signed)
.  left message to have patient return my call.  

## 2017-05-23 ENCOUNTER — Telehealth: Payer: Self-pay | Admitting: *Deleted

## 2017-05-23 NOTE — Telephone Encounter (Signed)
[facility] called and stated that patient is requesting refill on his Tramadol 50mg .  Per Refill Note dated 05/20/17 Tramadol was DISCONTINUED. Patient has not returned Dee's phone call.  Informed pharmacy that medication was Discontinued. Pharmacist stated that she will let patient know when he calls back.

## 2017-05-27 ENCOUNTER — Other Ambulatory Visit: Payer: Self-pay | Admitting: Internal Medicine

## 2017-05-27 NOTE — Telephone Encounter (Signed)
Not due until 05/30/2017, refill denied

## 2017-05-29 ENCOUNTER — Other Ambulatory Visit: Payer: Self-pay | Admitting: Nurse Practitioner

## 2017-05-29 DIAGNOSIS — J40 Bronchitis, not specified as acute or chronic: Secondary | ICD-10-CM

## 2017-05-30 ENCOUNTER — Other Ambulatory Visit: Payer: Self-pay | Admitting: Internal Medicine

## 2017-05-30 NOTE — Telephone Encounter (Signed)
Ok to fill as is.

## 2017-05-30 NOTE — Telephone Encounter (Signed)
Ok to fill? Last filled 04/29/17

## 2017-06-04 ENCOUNTER — Telehealth: Payer: Self-pay | Admitting: *Deleted

## 2017-06-04 NOTE — Telephone Encounter (Signed)
Patient is calling wanting to pick up his Hydromorphone tomorrow (06/05/17). And will be calling to pick up his Hydrocodone, Temazepam and Methadone on Monday. Are these ok to fill with current dosage. Please Advise.   NCCSRS Database Verified LR 05/10/17-Hydrocodone LR 05/10/17-Methadone LR 05/10/17-Temazepam LR 05/06/17-Hydromorphone

## 2017-06-04 NOTE — Telephone Encounter (Signed)
D/c hydrocodone.  Ok to fill the methadone, temazepam and hydromorphone as he has been taking them.

## 2017-06-05 ENCOUNTER — Other Ambulatory Visit: Payer: Self-pay | Admitting: Internal Medicine

## 2017-06-05 MED ORDER — HYDROMORPHONE HCL 8 MG PO TABS: 8.0000 mg | ORAL_TABLET | Freq: Four times a day (QID) | ORAL | 0 refills | Status: DC | PRN

## 2017-06-05 NOTE — Telephone Encounter (Signed)
Hydrocodone Discontinue on medication list. Rx for Hydromorphone Printed.  NCCSRS Database Verified.

## 2017-06-07 ENCOUNTER — Other Ambulatory Visit: Payer: Self-pay | Admitting: Internal Medicine

## 2017-06-07 DIAGNOSIS — G8929 Other chronic pain: Secondary | ICD-10-CM

## 2017-06-10 ENCOUNTER — Telehealth: Payer: Self-pay | Admitting: *Deleted

## 2017-06-10 ENCOUNTER — Other Ambulatory Visit: Payer: Self-pay | Admitting: *Deleted

## 2017-06-10 DIAGNOSIS — G894 Chronic pain syndrome: Secondary | ICD-10-CM

## 2017-06-10 DIAGNOSIS — G039 Meningitis, unspecified: Secondary | ICD-10-CM

## 2017-06-10 MED ORDER — METHADONE HCL 10 MG PO TABS: 40.0000 mg | ORAL_TABLET | Freq: Four times a day (QID) | ORAL | 0 refills | Status: DC | PRN

## 2017-06-10 MED ORDER — TESTOSTERONE 20.25 MG/ACT (1.62%) TD GEL: 1.0000 | Freq: Every day | TRANSDERMAL | 1 refills | Status: DC

## 2017-06-10 NOTE — Telephone Encounter (Signed)
Patient called and requested Labwork to be done. Stated that he wants a Sed Rate because it was high before and Dr. Chilton SiGreen was thinking he had bone cancer. He also wants a Testosterone drawn. Please Advise.

## 2017-06-10 NOTE — Telephone Encounter (Signed)
Ok to fill at this quantity? 

## 2017-06-10 NOTE — Telephone Encounter (Signed)
Patient called requesting Refill on his Methadone, Hydrocodone, Baclofen, Volltaren gel and Androgel.   The Baclofen and Voltaren has been sent to pharmacy earlier this morning. Printed Rx for the Methadone and Androgel. Dr. Renato Gailseed DISCONTINUED the Hydrocodone (see message dated 11/20).  Patient aware.

## 2017-06-10 NOTE — Telephone Encounter (Signed)
NCCSRS Database Verified.  

## 2017-06-11 ENCOUNTER — Other Ambulatory Visit: Payer: Self-pay

## 2017-06-11 ENCOUNTER — Telehealth: Payer: Self-pay

## 2017-06-11 NOTE — Telephone Encounter (Signed)
Testosterone was ordered as a future lab at last appointment and I added the ESR order. He will just need a lab appointment please.  He can come at his convenience.   Nylan Nevel L. Tymeshia Awan, D.O. [facility] [address], [place] [address] Cell Phone (Mon-Fri 8am-5pm):  [phone] On Call:  [phone] & follow prompts after 5pm & weekends Office Phone:  [phone] Office Fax:  [phone]

## 2017-06-11 NOTE — Telephone Encounter (Signed)
Patient notified and will come today to have drawn.

## 2017-06-11 NOTE — Telephone Encounter (Signed)
Patient dropped off letter addressed to Dr.Reed. Letter reads:  Dr Renato Gailseed, I have been very ill with heart problems where my heart rate was going up to 190 beats per minute. I lost 65 pounds. I had lost 30 pounds before this with no apparent rhyme or reason. I am making up a list of my current illnesses and surgeries. If you want a current list please call. Dr.Green thought I needed to be in assisted living and so did NauruDebbie. I have been on a walker until my shoulders gave out. I would like to talk to you personally about this medicine cut off for no apparent reason but after you read this report and how long it goes back (16) I believe you will understand it better.  Regards and Happy Holidays  Signed Austin Barnes  P.S. Sorry about the typing and spelling I do not have word.    Side Note: Pending Appt 07/25/2017  Please review and advise

## 2017-06-12 NOTE — Telephone Encounter (Signed)
Spoke with patient, scheduled appointment with Dr.Reed tomorrow at 2:30 pm.  Patient was resistant to schedule appointment and stated " It's not really anything that she can do."

## 2017-06-12 NOTE — Telephone Encounter (Signed)
He will need an appointment to evaluate what is going on with his heart.  He will need an EKG.

## 2017-06-13 ENCOUNTER — Encounter: Payer: Self-pay | Admitting: Internal Medicine

## 2017-06-13 ENCOUNTER — Ambulatory Visit (INDEPENDENT_AMBULATORY_CARE_PROVIDER_SITE_OTHER): Payer: Medicare Other | Admitting: Internal Medicine

## 2017-06-13 VITALS — BP 160/90 | HR 91 | Temp 98.4°F | Wt 159.0 lb

## 2017-06-13 DIAGNOSIS — G894 Chronic pain syndrome: Secondary | ICD-10-CM

## 2017-06-13 DIAGNOSIS — R1010 Upper abdominal pain, unspecified: Secondary | ICD-10-CM

## 2017-06-13 DIAGNOSIS — R000 Tachycardia, unspecified: Secondary | ICD-10-CM | POA: Diagnosis not present

## 2017-06-13 DIAGNOSIS — E349 Endocrine disorder, unspecified: Secondary | ICD-10-CM

## 2017-06-13 DIAGNOSIS — Z8719 Personal history of other diseases of the digestive system: Secondary | ICD-10-CM

## 2017-06-13 NOTE — Patient Instructions (Signed)
Try to take only liquids until your abdominal pain improves.  We will call you with your lab results.
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
## 2017-06-14 ENCOUNTER — Telehealth: Payer: Self-pay

## 2017-06-14 ENCOUNTER — Other Ambulatory Visit: Payer: Self-pay

## 2017-06-14 ENCOUNTER — Other Ambulatory Visit: Payer: Self-pay | Admitting: Internal Medicine

## 2017-06-14 DIAGNOSIS — R000 Tachycardia, unspecified: Secondary | ICD-10-CM

## 2017-06-14 DIAGNOSIS — E349 Endocrine disorder, unspecified: Secondary | ICD-10-CM

## 2017-06-14 LAB — CBC WITH DIFFERENTIAL/PLATELET
Basophils Absolute: 22 cells/uL (ref 0–200)
Basophils Relative: 0.4 %
Eosinophils Absolute: 11 cells/uL — ABNORMAL LOW (ref 15–500)
Eosinophils Relative: 0.2 %
HCT: 35.4 % — ABNORMAL LOW (ref 38.5–50.0)
Hemoglobin: 11.5 g/dL — ABNORMAL LOW (ref 13.2–17.1)
Lymphs Abs: 1226 cells/uL (ref 850–3900)
MCH: 26.7 pg — ABNORMAL LOW (ref 27.0–33.0)
MCHC: 32.5 g/dL (ref 32.0–36.0)
MCV: 82.1 fL (ref 80.0–100.0)
MPV: 9 fL (ref 7.5–12.5)
Monocytes Relative: 7.4 %
Neutro Abs: 3742 cells/uL (ref 1500–7800)
Neutrophils Relative %: 69.3 %
Platelets: 382 10*3/uL (ref 140–400)
RBC: 4.31 10*6/uL (ref 4.20–5.80)
RDW: 14.1 % (ref 11.0–15.0)
Total Lymphocyte: 22.7 %
WBC mixed population: 400 cells/uL (ref 200–950)
WBC: 5.4 10*3/uL (ref 3.8–10.8)

## 2017-06-14 LAB — TESTOSTERONE TOTAL,FREE,BIO, MALES
Albumin: 4.3 g/dL (ref 3.6–5.1)
Sex Hormone Binding: 95 nmol/L — ABNORMAL HIGH (ref 22–77)
Testosterone, Bioavailable: 36.9 ng/dL — ABNORMAL LOW (ref >=110.0)
Testosterone, Free: 18.7 pg/mL — ABNORMAL LOW (ref 46.0–224.0)
Testosterone: 371 ng/dL (ref 250–827)

## 2017-06-14 LAB — COMPLETE METABOLIC PANEL WITH GFR
AG Ratio: 1.5 (calc) (ref 1.0–2.5)
ALT: 19 U/L (ref 9–46)
AST: 26 U/L (ref 10–35)
Albumin: 4.2 g/dL (ref 3.6–5.1)
Alkaline phosphatase (APISO): 92 U/L (ref 40–115)
BUN: 13 mg/dL (ref 7–25)
CO2: 25 mmol/L (ref 20–32)
Calcium: 9.7 mg/dL (ref 8.6–10.3)
Chloride: 104 mmol/L (ref 98–110)
Creat: 0.97 mg/dL (ref 0.70–1.25)
GFR, Est African American: 95 mL/min/{1.73_m2} (ref >=60)
GFR, Est Non African American: 82 mL/min/{1.73_m2} (ref >=60)
Globulin: 2.8 g/dL (calc) (ref 1.9–3.7)
Glucose, Bld: 95 mg/dL (ref 65–99)
Potassium: 4.4 mmol/L (ref 3.5–5.3)
Sodium: 138 mmol/L (ref 135–146)
Total Bilirubin: 0.4 mg/dL (ref 0.2–1.2)
Total Protein: 7 g/dL (ref 6.1–8.1)

## 2017-06-14 LAB — AMYLASE: Amylase: 54 U/L (ref 21–101)

## 2017-06-14 LAB — TSH: TSH: 3.62 mIU/L (ref 0.40–4.50)

## 2017-06-14 LAB — SEDIMENTATION RATE: Sed Rate: 25 mm/h — ABNORMAL HIGH (ref 0–20)

## 2017-06-14 LAB — LIPASE: Lipase: 29 U/L (ref 7–60)

## 2017-06-14 MED ORDER — TESTOSTERONE 20.25 MG/ACT (1.62%) TD GEL: TRANSDERMAL | 1 refills | Status: DC

## 2017-06-14 NOTE — Telephone Encounter (Signed)
I spoke with patient regarding his lab results and the medication changes that Dr. Renato Gailseed recommended. Pt stated that he does not need a new Rx sent to his pharmacy at this time. Dr. Renato Gailseed printed the Rx at office but it was shredded.   Pt stated he would call when he was low on medication.

## 2017-06-14 NOTE — Telephone Encounter (Signed)
Spoke with patient, patient called to inform me of his historical diagnosis and surgical history. I updated history section of patients chart with the notation patient reported.  I encouraged patient to update this information at every visit to assure that his record is precise and accurate. Patient verbalized understanding.

## 2017-06-14 NOTE — Telephone Encounter (Signed)
Message left on VM requsting to speak to Chrae regarding MRI. Please call patient in about 30 minutes per patient.

## 2017-06-14 NOTE — Telephone Encounter (Signed)
It is not clear if what he is providing verbally is accurate.  We do not have physical records of much of it.

## 2017-06-17 ENCOUNTER — Telehealth: Payer: Self-pay | Admitting: *Deleted

## 2017-06-17 NOTE — Telephone Encounter (Signed)
He stated about 3 days. Has been taking stool softners since he was 23.

## 2017-06-17 NOTE — Telephone Encounter (Signed)
Patient called and stated that he has turned bad since last OV. Patient stated that he can't keep anything down on his stomach. Stated that he had a Bypass done on his stomach in 1995 and feels like that has returned.Stated he is so weak he can't even get out of bed. Dry Heaving. Offered patient an appointment but patient stated that he was just in and Dr. Renato Gailseed knows about this. Please Advise.   Patient called c/o abdominal pain  1. Location?All over. Severe  2. How bad does the pain hurt on scale of 1-10 (10 being the worse pain you have ever felt)?10  3. Any associated symptoms like nausea, vomiting, diarrhea, constipation, blood in stool, or dark stool?Constipation and Nausea and Vomiting  4. Are you able to eat or drink? If yes, when was the last time you ate or drank something? No, Drank something this morning. Ate something last night (bowl of Cereal) didn't stay down.   5 Any fever?  No  I will forward your responses to you provider and call with instructions, if your symptoms persist or progress seek immediate medical attention at your nearest urgent care or emergency room.

## 2017-06-17 NOTE — Telephone Encounter (Signed)
Noted  

## 2017-06-17 NOTE — Telephone Encounter (Signed)
When did he last have a bowel movement?

## 2017-06-17 NOTE — Telephone Encounter (Signed)
Patient stated that he does not want to start on any new medication. Stated that he will just go to the ER and hung up.

## 2017-06-17 NOTE — Telephone Encounter (Signed)
Let's start him on relistor 150mg  4 tabs po daily for his constipation.  Hold for loose stools.  We have all those samples we can give him.  If he's no better after this in terms of pain and intake, he should proceed to the ED.

## 2017-06-20 ENCOUNTER — Telehealth: Payer: Self-pay | Admitting: *Deleted

## 2017-06-20 NOTE — Telephone Encounter (Signed)
Patient called back and stated that he was no better. Stated he thinks his constipation is worse and wants the medication you prescribed. Stated that he didn't go to the hospital because he had no way there. Stated that he still has nausea and losing weight. Is it still ok to give him the samples of Relistor 150mg  4 tabs po daily for his constipation? Please Advise.

## 2017-06-20 NOTE — Telephone Encounter (Signed)
Yes, give him the samples.  Has he had a bm since the last phone call?

## 2017-06-21 NOTE — Telephone Encounter (Signed)
Samples placed at the front for pick-up  Called patient, phone rung continusioly, unable to leave message. I will try to reach patient again later

## 2017-06-24 NOTE — Telephone Encounter (Signed)
Tried calling patient, phone just rings. To try again later.

## 2017-06-25 ENCOUNTER — Other Ambulatory Visit: Payer: Self-pay | Admitting: Internal Medicine

## 2017-06-25 ENCOUNTER — Telehealth: Payer: Self-pay | Admitting: *Deleted

## 2017-06-25 NOTE — Telephone Encounter (Signed)
For documentation purposes: As noted throughout the chart in phone notes and my notes at appointments, he's been given several clear explanations for the medication changes.  It's concerning that he does not remember these conversations which could be because of the amount of medications he was on before they were reduced.    I certainly hope he's had a bowel movement by this point--I wish he would have directly answered that question.

## 2017-06-25 NOTE — Telephone Encounter (Signed)
Patient stated that this issue was NOT discussed nor was a reason given to why Dr. Renato Gailseed took him off his pain medications. Patient stated that was fine, he would just seek other action and you could take this matter up with his sisters.   I asked patient about his BM and he stated it was no better. I told him that we have been trying to contact him regarding samples left for him to try and he stated that his phone hasn't rang. Informed him we have tried 3 times. He disagreed.  Stated he Austin Barnes or Austin Barnes not come pick them up and hung up.

## 2017-06-25 NOTE — Telephone Encounter (Signed)
Patient called and Left message on Clinical Intake for Dr. Renato Gailseed to look at his file. Patient stated that all his surgeries or problems were not listed. Patient stated he needs his pain medications back. Stated that [place] [facility] [facility] [place], and they are the ones that prescribed him these medications. Stated he needs them back or he'll have to do something else. Stated that he has no breakthrough pain medication no more and stated that Dr. Chilton SiGreen would not have given him the pain medications if he didn't think he needed them. Patient stated this is not fair to him, he can barely get up. Stated that he needs all of his pain medications back. Please Advise.

## 2017-06-25 NOTE — Telephone Encounter (Signed)
Noted.  This has been discussed multiple times.  He still has large doses of methadone and dilaudid for pain plus a muscle relaxant/anxiety medication.    What happened to his bowel situation?  Did he have a bowel movement with the relistor?  Did he come pick up those samples?   Apparently, he did not visit the ED.      If the surgeries are not in the record, it's because they were not entered before I took over his care.  I don't have [facility] from [facility] ago to go by.

## 2017-06-25 NOTE — Telephone Encounter (Signed)
Patient notified and stated that his constipation is no better and that he may come and get the samples and try them he didn't know if he would or not.

## 2017-06-27 ENCOUNTER — Other Ambulatory Visit: Payer: Self-pay | Admitting: Internal Medicine

## 2017-06-27 NOTE — Telephone Encounter (Signed)
Last filled 05/30/17

## 2017-07-03 ENCOUNTER — Other Ambulatory Visit: Payer: Self-pay | Admitting: Internal Medicine

## 2017-07-03 ENCOUNTER — Telehealth: Payer: Self-pay | Admitting: *Deleted

## 2017-07-03 DIAGNOSIS — G039 Meningitis, unspecified: Secondary | ICD-10-CM
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
## 2017-07-03 NOTE — Telephone Encounter (Signed)
We could taper down the dilaudid (hydromorphone) and replace it with ultram.  Continue methadone.
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
## 2017-07-04 MED ORDER — HYDROMORPHONE HCL 8 MG PO TABS: 8.0000 mg | ORAL_TABLET | Freq: Four times a day (QID) | ORAL | 0 refills | Status: DC | PRN

## 2017-07-04 NOTE — Telephone Encounter (Signed)
Patient called checking on his Rx for his Dilaudid. Pended Rx for approval.  [place] Database Verified.

## 2017-07-05 ENCOUNTER — Other Ambulatory Visit: Payer: Self-pay | Admitting: Internal Medicine

## 2017-07-07 ENCOUNTER — Other Ambulatory Visit: Payer: Self-pay | Admitting: Internal Medicine

## 2017-07-07 DIAGNOSIS — F5101 Primary insomnia: Secondary | ICD-10-CM

## 2017-07-10 ENCOUNTER — Other Ambulatory Visit: Payer: Self-pay | Admitting: *Deleted

## 2017-07-10 DIAGNOSIS — F5101 Primary insomnia: Secondary | ICD-10-CM

## 2017-07-10 DIAGNOSIS — G894 Chronic pain syndrome: Secondary | ICD-10-CM

## 2017-07-10 DIAGNOSIS — G039 Meningitis, unspecified: Secondary | ICD-10-CM

## 2017-07-10 NOTE — Telephone Encounter (Signed)
Last filled 06/07/2017 Database checked and verified rx called into pharmacy

## 2017-07-11 ENCOUNTER — Other Ambulatory Visit: Payer: Self-pay | Admitting: *Deleted

## 2017-07-11 DIAGNOSIS — F5101 Primary insomnia: Secondary | ICD-10-CM

## 2017-07-11 DIAGNOSIS — G039 Meningitis, unspecified: Secondary | ICD-10-CM

## 2017-07-11 DIAGNOSIS — G894 Chronic pain syndrome: Secondary | ICD-10-CM

## 2017-07-11 MED ORDER — METHADONE HCL 10 MG PO TABS: ORAL_TABLET | ORAL | 0 refills | Status: DC

## 2017-07-11 NOTE — Telephone Encounter (Signed)
Patient called and requested refill. [place]CC[place] Database Verified. LR for Methadone was 06/11/17.  Pharmacy Confirmed. Pended Rx's and sent to Dr. Renato Gailseed for approval.

## 2017-07-11 NOTE — Telephone Encounter (Signed)
Tried calling patient and phone just rings then hangs up.

## 2017-07-15 ENCOUNTER — Other Ambulatory Visit: Payer: Self-pay | Admitting: *Deleted

## 2017-07-15 ENCOUNTER — Other Ambulatory Visit: Payer: Self-pay | Admitting: Internal Medicine

## 2017-07-15 DIAGNOSIS — E349 Endocrine disorder, unspecified: Secondary | ICD-10-CM

## 2017-07-15 DIAGNOSIS — G8929 Other chronic pain: Secondary | ICD-10-CM

## 2017-07-15 MED ORDER — TESTOSTERONE 20.25 MG/ACT (1.62%) TD GEL: TRANSDERMAL | 1 refills | Status: DC

## 2017-07-15 NOTE — Telephone Encounter (Signed)
[facility].  LF 06/11/17 Pended Rx and sent to Dr. Renato Gailseed for approval.

## 2017-07-19 ENCOUNTER — Other Ambulatory Visit: Payer: Self-pay | Admitting: Internal Medicine

## 2017-07-19 ENCOUNTER — Telehealth: Payer: Self-pay | Admitting: *Deleted

## 2017-07-19 NOTE — Telephone Encounter (Signed)
Whatever happened to my recommendation for relistor samples to be taken for constipation?  Did he never pick those up?  He was to take 450mg  po daily.  Do we still have those?  If not, we can send a prescription in for him for this.  Alternatives would be symproic or movantik all of which are for opioid induced constipation.

## 2017-07-19 NOTE — Telephone Encounter (Signed)
Patient never picked up the samples for the Relistor, still up front. Stated he does not feel like it is constipation and is going to go to the ER. Patient feels like he needs a scan of his stomach. Stated he is going to the ER.

## 2017-07-19 NOTE — Telephone Encounter (Signed)
Patient called and stated that he is still having the same problems-Abdominal pain, Heartbeat felt in the abdomin, losing weight and weak.  Patient called c/o abdominal pain  1. Location? In the gut below the sternum  2. How bad does the pain hurt on scale of 1-10 (10 being the worse pain you have ever felt)? A 10 when its really acting up. Right now pain level is a 5-6  3. Any associated symptoms like nausea, vomiting, diarrhea, constipation, blood in stool, or dark stool? Constipation. Last BM was 3 days ago. Takes stool softner but doesn't help.   4. Are you able to eat or drink? If yes, when was the last time you ate or drank something? Trying to drink more water. When he eats something it aggravates it. Losing weight.   5 Any fever? No but breaks out in cold sweats.   I will forward your responses to you provider and call with instructions, if your symptoms persist or progress seek immediate medical attention at your nearest urgent care or emergency room.

## 2017-07-19 NOTE — Telephone Encounter (Signed)
Noted.  I had suggested that last time when he called, but he never went or called back.

## 2017-07-21 ENCOUNTER — Emergency Department (HOSPITAL_COMMUNITY)
Admission: EM | Admit: 2017-07-21 | Discharge: 2017-07-21 | Disposition: A | Discharge status: Home / Self Care | Payer: Medicare Other | Attending: Emergency Medicine | Admitting: Emergency Medicine

## 2017-07-21 ENCOUNTER — Emergency Department (HOSPITAL_COMMUNITY): Payer: Medicare Other

## 2017-07-21 ENCOUNTER — Encounter (HOSPITAL_COMMUNITY): Payer: Self-pay | Admitting: Emergency Medicine

## 2017-07-21 ENCOUNTER — Other Ambulatory Visit: Payer: Self-pay

## 2017-07-21 DIAGNOSIS — M549 Dorsalgia, unspecified: Secondary | ICD-10-CM | POA: Diagnosis not present

## 2017-07-21 DIAGNOSIS — R1084 Generalized abdominal pain: Secondary | ICD-10-CM | POA: Diagnosis not present

## 2017-07-21 DIAGNOSIS — E039 Hypothyroidism, unspecified: Secondary | ICD-10-CM | POA: Diagnosis not present

## 2017-07-21 DIAGNOSIS — D649 Anemia, unspecified: Secondary | ICD-10-CM | POA: Diagnosis not present

## 2017-07-21 DIAGNOSIS — Z79899 Other long term (current) drug therapy: Secondary | ICD-10-CM | POA: Diagnosis not present

## 2017-07-21 DIAGNOSIS — F17210 Nicotine dependence, cigarettes, uncomplicated: Secondary | ICD-10-CM | POA: Insufficient documentation

## 2017-07-21 DIAGNOSIS — E1151 Type 2 diabetes mellitus with diabetic peripheral angiopathy without gangrene: Secondary | ICD-10-CM | POA: Diagnosis not present

## 2017-07-21 DIAGNOSIS — R630 Anorexia: Secondary | ICD-10-CM | POA: Insufficient documentation

## 2017-07-21 DIAGNOSIS — I10 Essential (primary) hypertension: Secondary | ICD-10-CM | POA: Insufficient documentation

## 2017-07-21 DIAGNOSIS — R109 Unspecified abdominal pain: Secondary | ICD-10-CM | POA: Diagnosis not present

## 2017-07-21 DIAGNOSIS — G8929 Other chronic pain: Secondary | ICD-10-CM | POA: Insufficient documentation

## 2017-07-21 DIAGNOSIS — R110 Nausea: Secondary | ICD-10-CM | POA: Diagnosis not present

## 2017-07-21 LAB — URINALYSIS, ROUTINE W REFLEX MICROSCOPIC
BILIRUBIN URINE: NEGATIVE
Glucose, UA: NEGATIVE mg/dL
HGB URINE DIPSTICK: NEGATIVE
Ketones, ur: 20 mg/dL — AB
Leukocytes, UA: NEGATIVE
Nitrite: NEGATIVE
Protein, ur: NEGATIVE mg/dL
SPECIFIC GRAVITY, URINE: 1.025 (ref 1.005–1.030)
pH: 5 (ref 5.0–8.0)

## 2017-07-21 LAB — COMPREHENSIVE METABOLIC PANEL
ALBUMIN: 4.4 g/dL (ref 3.5–5.0)
ALK PHOS: 106 U/L (ref 38–126)
ALT: 15 U/L — AB (ref 17–63)
AST: 21 U/L (ref 15–41)
Anion gap: 10 (ref 5–15)
BUN: 12 mg/dL (ref 6–20)
CALCIUM: 9.6 mg/dL (ref 8.9–10.3)
CO2: 25 mmol/L (ref 22–32)
CREATININE: 1.07 mg/dL (ref 0.61–1.24)
Chloride: 102 mmol/L (ref 101–111)
GFR calc Af Amer: >60 mL/min (ref >60)
GFR calc non Af Amer: >60 mL/min (ref >60)
GLUCOSE: 106 mg/dL — AB (ref 65–99)
Potassium: 3.8 mmol/L (ref 3.5–5.1)
SODIUM: 137 mmol/L (ref 135–145)
Total Bilirubin: 0.7 mg/dL (ref 0.3–1.2)
Total Protein: 7.4 g/dL (ref 6.5–8.1)

## 2017-07-21 LAB — CBC
HCT: 40.3 % (ref 39.0–52.0)
Hemoglobin: 12.4 g/dL — ABNORMAL LOW (ref 13.0–17.0)
MCH: 26.2 pg (ref 26.0–34.0)
MCHC: 30.8 g/dL (ref 30.0–36.0)
MCV: 85 fL (ref 78.0–100.0)
Platelets: 400 10*3/uL (ref 150–400)
RBC: 4.74 MIL/uL (ref 4.22–5.81)
RDW: 13.5 % (ref 11.5–15.5)
WBC: 7.3 10*3/uL (ref 4.0–10.5)

## 2017-07-21 LAB — I-STAT TROPONIN, ED: Troponin i, poc: 0 ng/mL (ref 0.00–0.08)

## 2017-07-21 LAB — LIPASE, BLOOD: Lipase: 27 U/L (ref 11–51)

## 2017-07-21 MED ORDER — HYDROMORPHONE HCL 2 MG PO TABS
8.0000 mg | ORAL_TABLET | Freq: Once | ORAL | Status: AC
  Administered 2017-07-21: 8 mg via ORAL
  Filled 2017-07-21: qty 4

## 2017-07-21 MED ORDER — IOPAMIDOL (ISOVUE-300) INJECTION 61%
INTRAVENOUS | Status: AC
  Administered 2017-07-21: 100 mL
  Filled 2017-07-21: qty 100

## 2017-07-21 MED ORDER — METHADONE HCL 10 MG PO TABS
10.0000 mg | ORAL_TABLET | Freq: Once | ORAL | Status: AC
  Administered 2017-07-21: 10 mg via ORAL
  Filled 2017-07-21 (×2): qty 1

## 2017-07-21 NOTE — ED Triage Notes (Signed)
Pt. Stated Ive had stomach pain and heart racing for 3 weeks.This is ongoing since 1995

## 2017-07-21 NOTE — ED Provider Notes (Signed)
[facility] EMERGENCY DEPARTMENT Provider Note   CSN: [id number] Arrival date & time: 07/21/17  1321     History   Chief Complaint Chief Complaint  Patient presents with  . Abdominal Pain  . Palpitations    HPI Austin Barnes is a 67 y.o. male.  The history is provided by the patient and medical records.  Abdominal Pain   This is a chronic problem. Episode onset: several months. The problem occurs constantly. The problem has not changed since onset.The pain is associated with an unknown factor. The pain is located in the generalized abdominal region. The quality of the pain is dull. The pain is moderate. Associated symptoms include anorexia and nausea. Pertinent negatives include fever, vomiting, dysuria and headaches. Associated symptoms comments: Weight loss. Nothing aggravates the symptoms. The symptoms are relieved by palpation. Past workup does not include CT scan. Past medical history comments: opiate induced constipation, prior abdominal surgery, chronic pancreatitis.    Past Medical History:  Diagnosis Date  . 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 Anemia   . Arthritis   . Chronic pain   . 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 DDD (degenerative disc disease), cervical    Patient reported   . DDD (degenerative disc disease), lumbar    Patient reported   . DDD (degenerative disc disease), thoracic    Patient Reported   . DVT (deep venous thrombosis) (HCC)   . Heart murmur   . Hiatal hernia   . Hypothyroidism 12/14/2014  . Impotence   . Other and unspecified hyperlipidemia   . 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 Peripheral vascular disease (HCC)   . Pneumonia    double pneumonia in High School  . Reflux   . Ruptured disc, cervical    Patient reported   . Ruptured disc, thoracic    Patient reported   . Spinal stenosis, thoracic    Patient reported   . Stenosis of lumbosacral spine    Patient reported    . Stenosis, cervical spine    Patient reported   . Testosterone insufficiency 2013  . Ulcer    Peptic disease  . Unspecified essential hypertension     Patient Active Problem List   Diagnosis Date Noted  . 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 Shoulder pain 10/30/2016  . 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 Leg pain 08/21/2016  . Tremor 08/21/2016  . Cough 07/06/2016  . Acute bronchitis 04/18/2016  . Loss of weight 12/14/2015  . Weak 12/14/2015  . Myalgia and myositis 12/14/2015  . Frequency of urination 12/14/2015  . Hypothyroidism 12/14/2014  . 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 Malaise 01/27/2014  . Hip pain 01/27/2014  . Pain in left elbow 01/27/2014  . Ganglion cyst 06/23/2013  . Chronic pain 04/08/2013  . 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 Arthritis   . Essential hypertension   . Hiatal hernia   . Peripheral vascular disease (HCC)   . GERD (gastroesophageal reflux disease)   . Anemia   . Testosterone insufficiency   . Hyperlipidemia     Past Surgical History:  Procedure Laterality Date  . BACK SURGERY     15 back surgeries, starting at age 63   . CARPAL TUNNEL RELEASE Right 1995  . KIDNEY STONE SURGERY    . SPINE SURGERY         Home Medications    Prior  to Admission medications   Medication Sig Start Date End Date Taking? Authorizing Provider  baclofen (LIORESAL) 10 MG tablet TAKE 1 TABLET THREE TIMES DAILY AS NEEDED FOR MUSCLE RELAXATION. 07/19/17  Yes Reed, Tiffany L, DO  diazepam (VALIUM) 2 MG tablet TAKE 1 TABLET EVERY 6 HOURS AS NEEDED FOR MUSCLE SPASM. 06/28/17  Yes Reed, Tiffany L, DO  HYDROmorphone (DILAUDID) 8 MG tablet Take 1 tablet (8 mg total) by mouth every 6 (six) hours as needed for severe pain. No driving while taking this 07/04/17  Yes Reed, Tiffany L, DO  levothyroxine (SYNTHROID, LEVOTHROID) 75 MCG tablet Take one tablet by mouth 30 minutes before  breakfast for thyroid 05/02/17  Yes Reed, Tiffany L, DO  losartan (COZAAR) 50 MG tablet Take 50 mg by mouth daily. 04/29/17  Yes [provider]  methadone (DOLOPHINE) 10 MG tablet Take four tablets by mouth every 6 hours as needed to control pain 07/11/17  Yes Reed, Tiffany L, DO  minoxidil (LONITEN) 10 MG tablet TAKE 1 TABLET DAILY TO CONTROL BLOOD PRESSURE. 05/02/17  Yes Reed, Tiffany L, DO  NITRO-BID 2 % ointment APPLY 1 INCH STRIP TO FEET TWICE DAILY. 07/03/17  Yes Reed, Tiffany L, DO  omeprazole (PRILOSEC) 20 MG capsule TAKE (2) CAPSULES TWICE DAILY. 05/14/16  Yes Kimber Relic, MD  prochlorperazine (COMPAZINE) 10 MG tablet TAKE 1 TABLET EVERY 8 HOURS AS NEEDED FOR NAUSEA. 07/05/17  Yes Reed, Tiffany L, DO  temazepam (RESTORIL) 7.5 MG capsule TAKE 1 CAPSULE ONCE DAILY AT BEDTIME AS NEEDED FOR REST. 07/10/17  Yes Reed, Tiffany L, DO  VENTOLIN HFA 108 (90 Base) MCG/ACT inhaler USE 2 PUFFS EVERY 6 HOURS AS NEEDED FOR SHORTNESS OF BREATH AND WHEEZING. 05/29/17  Yes Reed, Tiffany L, DO  VOLTAREN 1 % GEL APPLY 2-4GM TO AFFECTED AREA UP TO 4 TIMES A DAY 07/15/17  Yes Reed, Tiffany L, DO  Testosterone (ANDROGEL PUMP) 20.25 MG/ACT (1.62%) GEL Apply 1 Pump topically every other day AND 2 Pump every other day. On opposite days. 07/15/17   Kermit Balo, DO    Family History Family History  Problem Relation Age of Onset  . Hypertension Mother   . Stroke Father 101    Social History Social History   Tobacco Use  . Smoking status: Current Some Day Smoker    Packs/day: 0.25    Years: 3.00    Pack years: 0.75    Types: Cigarettes  . Smokeless tobacco: Former Neurosurgeon    Quit date: 07/17/2007  . Tobacco comment: Last quit date was 8-10 years ago as of 2018   Substance Use Topics  . Alcohol use: No    Alcohol/week: 0.0 oz  . Drug use: No     Allergies   Disalcid [salsalate]; Feldene [piroxicam]; Hctz [hydrochlorothiazide]; Other; Penicillins; Codeine; Demerol; and Sulfa  antibiotics   Review of Systems Review of Systems  Constitutional: Positive for unexpected weight change. Negative for chills and fever.  HENT: Negative for rhinorrhea and sore throat.   Eyes: Negative for visual disturbance.  Respiratory: Negative for cough and shortness of breath.   Cardiovascular: Negative for chest pain.  Gastrointestinal: Positive for abdominal pain, anorexia and nausea. Negative for vomiting.  Genitourinary: Negative for dysuria.  Musculoskeletal: Positive for back pain (chronic).  Skin: Negative for rash.  Allergic/Immunologic: Negative for immunocompromised state.  Neurological: Negative for syncope and headaches.  All other systems reviewed and are negative.    Physical Exam Updated Vital Signs BP (!) 175/93  Pulse 62   Temp 99.5 F (37.5 C) (Oral)   Resp 11   Ht 5\' 10"  (1.778 m)   Wt 76.2 kg (168 lb)   SpO2 99%   BMI 24.11 kg/m   Physical Exam  Constitutional: He is oriented to person, place, and time. He appears well-developed and well-nourished.  HENT:  Head: Normocephalic and atraumatic.  Eyes: Conjunctivae are normal.  Neck: Neck supple.  Cardiovascular: Normal rate and regular rhythm.  No murmur heard. Pulmonary/Chest: Effort normal and breath sounds normal. No respiratory distress.  Abdominal: Soft. There is tenderness (diffusely tender to light touch, worst in periumbilical region).  Scars from prior surgeries  Musculoskeletal: Normal range of motion. He exhibits no edema.  Neurological: He is alert and oriented to person, place, and time.  Skin: Skin is warm and dry.  Psychiatric: He has a normal mood and affect.  Nursing note and vitals reviewed.    ED Treatments / Results  Labs (all labs ordered are listed, but only abnormal results are displayed) Labs Reviewed  COMPREHENSIVE METABOLIC PANEL - Abnormal; Notable for the following components:      Result Value   Glucose, Bld 106 (*)    ALT 15 (*)    All other components  within normal limits  CBC - Abnormal; Notable for the following components:   Hemoglobin 12.4 (*)    All other components within normal limits  URINALYSIS, ROUTINE W REFLEX MICROSCOPIC - Abnormal; Notable for the following components:   Ketones, ur 20 (*)    All other components within normal limits  LIPASE, BLOOD  I-STAT TROPONIN, ED    EKG  EKG Interpretation  Date/Time:  Sunday July 21 2017 14:01:42 EST Ventricular Rate:  77 PR Interval:  144 QRS Duration: 84 QT Interval:  388 QTC Calculation: 439 R Axis:   62 Text Interpretation:  Normal sinus rhythm Right atrial enlargement Borderline ECG No significant change since last tracing Confirmed by Frederick PeersLittle, Rachel [phone]([id number]) on 07/21/2017 6:30:53 PM       Radiology Ct Abdomen Pelvis W Contrast  Result Date: 07/21/2017 CLINICAL DATA:  Nonlocalized abdominal pain. Unintentional weight loss. EXAM: CT ABDOMEN AND PELVIS WITH CONTRAST TECHNIQUE: Multidetector CT imaging of the abdomen and pelvis was performed using the standard protocol following bolus administration of intravenous contrast. CONTRAST:  100mL ISOVUE-300 IOPAMIDOL (ISOVUE-300) INJECTION 61% COMPARISON:  None. FINDINGS: Lower chest: The dependent right lower lobe ground-glass opacities with a slightly tree in bud distribution. No confluent consolidation. No pleural fluid. There are coronary artery calcifications. Hepatobiliary: There is intra and extrahepatic biliary ductal dilatation with common bile duct measuring 11 mm at the porta hepatis. Gallbladder distention, may be physiologic. No pericholecystic inflammation. No calcified gallstone or choledocholithiasis. No visualized obstructing mass. No focal hepatic lesion. Pancreas: Parenchymal atrophy. No pancreatic ductal dilatation or peripancreatic inflammation. No visualized pancreatic lesion. Spleen: Normal in size without focal abnormality. Adrenals/Urinary Tract: No adrenal nodule. Lobular renal contours with bilateral renal  parenchymal scarring. Nonobstructing stone in the lower right kidney. Mild prominence of the right renal pelvis without frank hydronephrosis. No perinephric edema. Tiny subcentimeter cyst in the mid left kidney. No suspicious renal lesion. Urinary bladder physiologically distended without wall thickening. Stomach/Bowel: Bowel evaluation is limited in the absence of IV contrast. Surgical clips of the gastroesophageal junction. No gastric wall thickening. No evidence of bowel obstruction or inflammation. Cecum appears high-riding in the mid abdomen. The appendix is not visualized. Vascular/Lymphatic: Mild to moderate aortic atherosclerosis. No aneurysm. No enlarged abdominal  or pelvic lymph nodes. Retroperitoneal surgical clips at the level of the sacrum. Reproductive: Probable right inguinal testis, image 77 series 3. Prostate gland is unremarkable. Other: Postsurgical change of the anterior abdominal wall. No free air, free fluid, or intra-abdominal fluid collection. Musculoskeletal: Postsurgical and degenerative change in the lumbar spine. Remote postsurgical or posttraumatic deformity of the left iliac bone. IMPRESSION: 1. Intra and extrahepatic biliary ductal dilatation without identified cause. Given normal LFTs this is likely spurious. No pancreatic ductal dilatation. 2. Bilateral renal cortical scarring. Nonobstructing left nephrolithiasis. 3. Right inguinal testis. 4. Vague ill-defined right lower lobe ground-glass opacities in the lung bases, slightly tree in bud in morphology, suggesting pneumonitis, infectious or inflammatory. 5.  Aortic Atherosclerosis (ICD10-I70.0). Electronically Signed   By: Rubye Oaks M.D.   On: 07/21/2017 21:24    Procedures Procedures (including critical care time)  Medications Ordered in ED Medications  iopamidol (ISOVUE-300) 61 % injection (100 mLs  Contrast Given 07/21/17 1954)  methadone (DOLOPHINE) tablet 10 mg (10 mg Oral Given 07/21/17 1905)  HYDROmorphone  (DILAUDID) tablet 8 mg (8 mg Oral Given 07/21/17 1905)     Initial Impression / Assessment and Plan / ED Course  I have reviewed the triage vital signs and the nursing notes.  Pertinent labs & imaging results that were available during my care of the patient were reviewed by me and considered in my medical decision making (see chart for details).     Pt with h/o chronic back pain, chronic pancreatitis, previous abdominal surgery presents with abdominal pain. Says he's had the pain for several months and has been asking his PCP to order some imaging; he cannot explain why they have not. In addition to the pain, he reports a 70lb unintentional weight loss since "the end of summer", decreased appetite, and nausea; says he is always constipated as a result of the opiates he takes, but says this pain feels different than constipation. Denies F/C, HA, lightheadedness, cough, CP, SOB, emesis, urinary symptoms, recent illness.  VS & exam as above. Labs unremarkable. CT A&P ordered based on his constellation of symptoms & showed no obvious explanation for his pain. Cause of the Pt's symptoms unclear, but doubt emergent etiology given history, exam, and workup. Pt has pain medication and antiemetics at home, so doesn't need prescriptions for either.  Explained all results to the Pt. Will discharge the Pt home. Recommending follow-up with PCP. ED return precautions provided. Pt acknowledged understanding of, and concurrence with the plan. All questions answered to his satisfaction. In stable condition at the time of discharge.  Final Clinical Impressions(s) / ED Diagnoses   Final diagnoses:  Generalized abdominal pain    ED Discharge Orders    None       Forest Becker, MD 07/21/17 2154    Clarene Duke Ambrose Finland, MD 07/22/17 2122

## 2017-07-25 ENCOUNTER — Encounter: Payer: Self-pay | Admitting: Internal Medicine

## 2017-07-25 ENCOUNTER — Ambulatory Visit (INDEPENDENT_AMBULATORY_CARE_PROVIDER_SITE_OTHER): Payer: Medicare Other | Admitting: Internal Medicine

## 2017-07-25 ENCOUNTER — Other Ambulatory Visit: Payer: Self-pay | Admitting: Internal Medicine

## 2017-07-25 VITALS — BP 140/78 | HR 72 | Temp 98.1°F | Wt 147.0 lb

## 2017-07-25 DIAGNOSIS — G039 Meningitis, unspecified: Secondary | ICD-10-CM

## 2017-07-25 DIAGNOSIS — Z8719 Personal history of other diseases of the digestive system: Secondary | ICD-10-CM | POA: Diagnosis not present

## 2017-07-25 DIAGNOSIS — E349 Endocrine disorder, unspecified: Secondary | ICD-10-CM

## 2017-07-25 DIAGNOSIS — D649 Anemia, unspecified: Secondary | ICD-10-CM | POA: Diagnosis not present

## 2017-07-25 DIAGNOSIS — T402X5A Adverse effect of other opioids, initial encounter: Secondary | ICD-10-CM | POA: Diagnosis not present

## 2017-07-25 DIAGNOSIS — E1151 Type 2 diabetes mellitus with diabetic peripheral angiopathy without gangrene: Secondary | ICD-10-CM | POA: Diagnosis not present

## 2017-07-25 DIAGNOSIS — E039 Hypothyroidism, unspecified: Secondary | ICD-10-CM

## 2017-07-25 DIAGNOSIS — K5903 Drug induced constipation: Secondary | ICD-10-CM

## 2017-07-25 DIAGNOSIS — G894 Chronic pain syndrome: Secondary | ICD-10-CM

## 2017-07-25 DIAGNOSIS — R1010 Upper abdominal pain, unspecified: Secondary | ICD-10-CM | POA: Diagnosis not present

## 2017-07-25 DIAGNOSIS — I10 Essential (primary) hypertension: Secondary | ICD-10-CM | POA: Diagnosis not present

## 2017-07-25 DIAGNOSIS — E785 Hyperlipidemia, unspecified: Secondary | ICD-10-CM | POA: Diagnosis not present

## 2017-07-25 NOTE — Patient Instructions (Signed)
Please go see the gastroenterologist about your ongoing abdominal pain and weight loss.    Please try the relistor for your constipation.  4 pills each morning.

## 2017-07-25 NOTE — Progress Notes (Signed)
Location:  [facility] clinic Provider:  Brentt Fread L. Renato Gails, D.O., C.M.D.  Code Status: full code Goals of Care:  Advanced Directives 03/22/2017  Does Patient Have a Medical Advance Directive? No  Type of Advance Directive -  Does patient want to make changes to medical advance directive? -  Copy of Healthcare Power of Attorney in Chart? -  Would patient like information on creating a medical advance directive? -   Chief Complaint  Patient presents with  . Medical Management of Chronic Issues    follow-up  . Follow-up    ER follow-up    HPI: Patient is a 67 y.o. male seen today for medical management of chronic diseases and ER follow-up for abdominal pain.  He also has lost more weight.  He gets more pain after eating.  Feels like heart is beating through his stomach.  Is going to now try the relistor.    Says when his bowels move, it's a whole new experience.  Taking dulcolax and then it's diarrhea.  Can go through him in an hour.    CT abd/pelvis with nonobstructive left nephrolithiasis.  Ducts were dilated in the gallbladder but pancreas appeared normal as did liver.  Says urine occasionally looks like oil.  No hematuria.  Other testing at the ED was all unremarkable on 07/21/17.  He continues to talk about his ex-wife and his wedding like it just happened.  She apparently held a leadership role at [employer] in years past.    Weight is now down to 147 lbs from max of 226 in March of 2018.  Drastic drop began in October of this year when the opioids were reduced.   Past Medical History:  Diagnosis Date  . 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 Anemia   . Arthritis   . Chronic pain   . 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 DDD (degenerative disc disease), cervical    Patient reported   . DDD (degenerative disc disease), lumbar    Patient reported   . DDD (degenerative disc disease), thoracic    Patient Reported   . DVT (deep venous thrombosis)  (HCC)   . Heart murmur   . Hiatal hernia   . Hypothyroidism 12/14/2014  . Impotence   . Other and unspecified hyperlipidemia   . 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 Peripheral vascular disease (HCC)   . Pneumonia    double pneumonia in High School  . Reflux   . Ruptured disc, cervical    Patient reported   . Ruptured disc, thoracic    Patient reported   . Spinal stenosis, thoracic    Patient reported   . Stenosis of lumbosacral spine    Patient reported   . Stenosis, cervical spine    Patient reported   . Testosterone insufficiency 2013  . Ulcer    Peptic disease  . Unspecified essential hypertension     Past Surgical History:  Procedure Laterality Date  . BACK SURGERY     15 back surgeries, starting at age 51   . CARPAL TUNNEL RELEASE Right 1995  . KIDNEY STONE SURGERY    . SPINE SURGERY      Allergies  Allergen Reactions  . Disalcid [Salsalate] Other (See Comments)    Patient has ulcers; can't take  . Feldene [Piroxicam] Other (See Comments)    Reaction not recalled by the patient  . Hctz [Hydrochlorothiazide] Other (See Comments)    Possibly caused pancreatitis (??)  .  Other Nausea And Vomiting    Green peppers  . Penicillins Other (See Comments)    Severe welts Has patient had a PCN reaction causing immediate rash, facial/tongue/throat swelling, SOB or lightheadedness with hypotension: Yes Has patient had a PCN reaction causing severe rash involving mucus membranes or skin necrosis: No Has patient had a PCN reaction that required hospitalization: No Has patient had a PCN reaction occurring within the last 10 years: No If all of the above answers are "NO", then may proceed with Cephalosporin use.   . Codeine Rash  . Demerol Hives and Rash  . Sulfa Antibiotics Rash    Outpatient Encounter Medications as of 07/25/2017  Medication Sig  . baclofen (LIORESAL) 10 MG tablet TAKE 1 TABLET THREE TIMES DAILY AS NEEDED FOR MUSCLE RELAXATION.  . diazepam  (VALIUM) 2 MG tablet TAKE 1 TABLET EVERY 6 HOURS AS NEEDED FOR MUSCLE SPASM.  Marland Kitchen HYDROmorphone (DILAUDID) 8 MG tablet Take 1 tablet (8 mg total) by mouth every 6 (six) hours as needed for severe pain. No driving while taking this  . levothyroxine (SYNTHROID, LEVOTHROID) 75 MCG tablet Take one tablet by mouth 30 minutes before breakfast for thyroid  . losartan (COZAAR) 50 MG tablet Take 50 mg by mouth daily.  . methadone (DOLOPHINE) 10 MG tablet Take four tablets by mouth every 6 hours as needed to control pain  . minoxidil (LONITEN) 10 MG tablet TAKE 1 TABLET DAILY TO CONTROL BLOOD PRESSURE.  Marland Kitchen NITRO-BID 2 % ointment APPLY 1 INCH STRIP TO FEET TWICE DAILY.  Marland Kitchen omeprazole (PRILOSEC) 20 MG capsule TAKE (2) CAPSULES TWICE DAILY.  Marland Kitchen prochlorperazine (COMPAZINE) 10 MG tablet TAKE 1 TABLET EVERY 8 HOURS AS NEEDED FOR NAUSEA.  Marland Kitchen temazepam (RESTORIL) 7.5 MG capsule TAKE 1 CAPSULE ONCE DAILY AT BEDTIME AS NEEDED FOR REST.  Marland Kitchen Testosterone (ANDROGEL PUMP) 20.25 MG/ACT (1.62%) GEL Apply 1 Pump topically every other day AND 2 Pump every other day. On opposite days.  . VENTOLIN HFA 108 (90 Base) MCG/ACT inhaler USE 2 PUFFS EVERY 6 HOURS AS NEEDED FOR SHORTNESS OF BREATH AND WHEEZING.  . VOLTAREN 1 % GEL APPLY 2-4GM TO AFFECTED AREA UP TO 4 TIMES A DAY   No facility-administered encounter medications on file as of 07/25/2017.     Review of Systems:  Review of Systems  Constitutional: Positive for malaise/fatigue and weight loss. Negative for chills and fever.  HENT: Negative for congestion and hearing loss.   Eyes: Negative for blurred vision.  Respiratory: Negative for cough and shortness of breath.   Cardiovascular: Positive for palpitations. Negative for chest pain, orthopnea, claudication, leg swelling and PND.  Gastrointestinal: Positive for abdominal pain and constipation. Negative for blood in stool, diarrhea, heartburn, melena, nausea and vomiting.  Genitourinary: Negative for dysuria, flank pain,  frequency, hematuria and urgency.  Musculoskeletal: Positive for back pain and falls.  Skin: Negative for itching and rash.       pallor  Neurological: Negative for dizziness, loss of consciousness and weakness.  Psychiatric/Behavioral: Positive for depression. Negative for memory loss and substance abuse. The patient is nervous/anxious.     Health Maintenance  Topic Date Due  . HIV Screening  04/03/1966  . FOOT EXAM  12/14/2015  . PNA vac Low Risk Adult (2 of 2 - PPSV23) 04/02/2017  . HEMOGLOBIN A1C  06/27/2017  . OPHTHALMOLOGY EXAM  07/16/2018 (Originally [birth year]1962)  . COLONOSCOPY  07/16/2018 (Originally 04/02/2002)  . TETANUS/TDAP  07/16/2018 (Originally 04/03/1971)  . INFLUENZA VACCINE  Completed  .  Hepatitis C Screening  Completed    Physical Exam: Vitals:   07/25/17 1312  BP: 140/78  Pulse: 72  Temp: 98.1 F (36.7 C)  TempSrc: Oral  SpO2: 97%  Weight: 147 lb (66.7 kg)   Body mass index is 21.09 kg/m. Physical Exam  Constitutional: He is oriented to person, place, and time.  HENT:  Head: Normocephalic and atraumatic.  Cardiovascular: Normal rate, regular rhythm, normal heart sounds and intact distal pulses.  Pulmonary/Chest: Effort normal and breath sounds normal. No respiratory distress.  Abdominal: Soft. Bowel sounds are normal. He exhibits no distension and no mass. There is tenderness. There is no rebound and no guarding.  Of midepigastric and left upper quadrant  Musculoskeletal: Normal range of motion. He exhibits tenderness.  Walks with limp, uses cane; wearing brace on right knee; tender over entire back  Neurological: He is alert and oriented to person, place, and time.  Skin: Skin is warm and dry. Capillary refill takes less than 2 seconds. There is pallor.  Psychiatric:  Tearful and sniffling during visit    Labs reviewed: Basic Metabolic Panel: Recent Labs    12/26/16 1230 01/28/17 0134 06/13/17 1530 07/21/17 1407  NA 138  --  138 137  K 4.7   --  4.4 3.8  CL 102  --  104 102  CO2 27  --  25 25  GLUCOSE 86  --  95 106*  BUN 17  --  13 12  CREATININE 1.13  --  0.97 1.07  CALCIUM 9.3  --  9.7 9.6  TSH 4.72* 2.76 3.62  --    Liver Function Tests: Recent Labs    12/26/16 1230 06/13/17 1530 07/21/17 1407  AST 16 26 21   ALT 12 19 15*  ALKPHOS 86  --  106  BILITOT 0.3 0.4 0.7  PROT 6.6 7.0 7.4  ALBUMIN 4.0  --  4.4   Recent Labs    09/12/16 1455 06/13/17 1530 07/21/17 1407  LIPASE 36 29 27  AMYLASE 57 54  --    No results for input(s): AMMONIA in the last 8760 hours. CBC: Recent Labs    03/22/17 1202 06/13/17 1530 07/21/17 1407  WBC 6.7 5.4 7.3  NEUTROABS 4,931 3,742  --   HGB 12.0* 11.5* 12.4*  HCT 37.0* 35.4* 40.3  MCV 81.5 82.1 85.0  PLT 374 382 400   Lipid Panel: No results for input(s): CHOL, HDL, LDLCALC, TRIG, CHOLHDL, LDLDIRECT in the last 8760 hours. Lab Results  Component Value Date   HGBA1C 5.5 12/26/2016    Procedures since last visit: Ct Abdomen Pelvis W Contrast  Result Date: 07/21/2017 CLINICAL DATA:  Nonlocalized abdominal pain. Unintentional weight loss. EXAM: CT ABDOMEN AND PELVIS WITH CONTRAST TECHNIQUE: Multidetector CT imaging of the abdomen and pelvis was performed using the standard protocol following bolus administration of intravenous contrast. CONTRAST:  ISOVUE-300 IOPAMIDOL (ISOVUE-300) INJECTION 61% COMPARISON:  None. FINDINGS: Lower chest: The dependent right lower lobe ground-glass opacities with a slightly tree in bud distribution. No confluent consolidation. No pleural fluid. There are coronary artery calcifications. Hepatobiliary: There is intra and extrahepatic biliary ductal dilatation with common bile duct measuring 11 mm at the porta hepatis. Gallbladder distention, may be physiologic. No pericholecystic inflammation. No calcified gallstone or choledocholithiasis. No visualized obstructing mass. No focal hepatic lesion. Pancreas: Parenchymal atrophy. No pancreatic  ductal dilatation or peripancreatic inflammation. No visualized pancreatic lesion. Spleen: Normal in size without focal abnormality. Adrenals/Urinary Tract: No adrenal nodule. Lobular renal contours  with bilateral renal parenchymal scarring. Nonobstructing stone in the lower right kidney. Mild prominence of the right renal pelvis without frank hydronephrosis. No perinephric edema. Tiny subcentimeter cyst in the mid left kidney. No suspicious renal lesion. Urinary bladder physiologically distended without wall thickening. Stomach/Bowel: Bowel evaluation is limited in the absence of IV contrast. Surgical clips of the gastroesophageal junction. No gastric wall thickening. No evidence of bowel obstruction or inflammation. Cecum appears high-riding in the mid abdomen. The appendix is not visualized. Vascular/Lymphatic: Mild to moderate aortic atherosclerosis. No aneurysm. No enlarged abdominal or pelvic lymph nodes. Retroperitoneal surgical clips at the level of the sacrum. Reproductive: Probable right inguinal testis, image 77 series 3. Prostate gland is unremarkable. Other: Postsurgical change of the anterior abdominal wall. No free air, free fluid, or intra-abdominal fluid collection. Musculoskeletal: Postsurgical and degenerative change in the lumbar spine. Remote postsurgical or posttraumatic deformity of the left iliac bone. IMPRESSION: 1. Intra and extrahepatic biliary ductal dilatation without identified cause. Given normal LFTs this is likely spurious. No pancreatic ductal dilatation. 2. Bilateral renal cortical scarring. Nonobstructing left nephrolithiasis. 3. Right inguinal testis. 4. Vague ill-defined right lower lobe ground-glass opacities in the lung bases, slightly tree in bud in morphology, suggesting pneumonitis, infectious or inflammatory. 5.  Aortic Atherosclerosis (ICD10-I70.0). Electronically Signed   By: Rubye OaksMelanie  Ehinger M.D.   On: 07/21/2017 21:24    Assessment/Plan 1. Adhesive  arachnoiditis -pt reports causing all of his chronic pain in his back and "all through his body" -I have reduced his opioid therapy extensively which previously included hydrocodone/apap, oxycodone/apap, tramadol.  He's now only on his methadone and dilaudid plus valium muscle relaxer, still at high doses.  Would like to reduce dilaudid further b/c pt reports that he still drives and b/c dosing is still significant--of course, this will be challenging    2. Pain of upper abdomen - h/o prior obstructing duodenal ulcer many years ago s/p vagotomy and GJ  - CT abdomen/pelvis seems to be benign, but does not some dilated bile ducts and with patient's weight loss, I'm concerned about this - difficult to assess him b/c he has chronic pain and I've been tapering his opioid therapy since around October when his weight loss became more significant--he's also depressed and says he can't afford to see therapy - Ambulatory referral to Gastroenterology - H. pylori breath test  3. History of pancreatitis - has h/o chronic pancreatitis, but seems this does not EVER get better?   - Ambulatory referral to Gastroenterology  4. Testosterone insufficiency -cont current supplement  5. Chronic pain syndrome - primarily his back, but he reports pain everywhere, also in right leg   6. Therapeutic opioid induced constipation -treat with relistor for constipation--samples provided--4 tabs daily  7. Essential hypertension 8. Anemia, unspecified type - COMPLETE METABOLIC PANEL WITH GFR; Future - CBC with Differential/Platelet; Future 9. Hyperlipidemia, unspecified hyperlipidemia type - Lipid panel; Future 10. Controlled type 2 DM with peripheral circulatory disorder (HCC) - Hemoglobin A1c; Future - Lipid panel; Future 11. Hypothyroidism, unspecified type - TSH; Future  Diagnoses 7-10 only entered to order routine labs for followup  Labs/tests ordered:  Orders Placed This Encounter  Procedures  . H.  pylori breath test  . Hemoglobin A1c    Standing Status:   Future    Standing Expiration Date:   03/25/2018  . COMPLETE METABOLIC PANEL WITH GFR    Standing Status:   Future    Standing Expiration Date:   03/25/2018  . CBC with  Differential/Platelet    Standing Status:   Future    Standing Expiration Date:   03/25/2018  . Lipid panel    Standing Status:   Future    Standing Expiration Date:   03/25/2018  . TSH    Standing Status:   Future    Standing Expiration Date:   03/25/2018  . Ambulatory referral to Gastroenterology    Referral Priority:   Routine    Referral Type:   Consultation    Referral Reason:   Specialty Services Required    Number of Visits Requested:   1   Next appt: 4 mos with labs before  Jenesis Suchy L. Wanetta Funderburke, D.O. Geriatrics [facility] [address], [place] [address] Cell Phone (Mon-Fri 8am-5pm):  [phone] On Call:  [phone] & follow prompts after 5pm & weekends Office Phone:  [phone] Office Fax:  [phone]

## 2017-07-26 ENCOUNTER — Other Ambulatory Visit: Payer: Self-pay | Admitting: Internal Medicine

## 2017-07-26 ENCOUNTER — Telehealth: Payer: Self-pay | Admitting: *Deleted

## 2017-07-26 LAB — H. PYLORI BREATH TEST: H. pylori Breath Test: NOT DETECTED

## 2017-07-26 MED ORDER — DIAZEPAM 2 MG PO TABS: ORAL_TABLET | ORAL | 0 refills | Status: DC

## 2017-07-26 NOTE — Telephone Encounter (Signed)
Patient called and requested refill.

## 2017-07-26 NOTE — Telephone Encounter (Signed)
Refill denied, pt not due until Monday 07/29/2017

## 2017-07-29 ENCOUNTER — Encounter: Payer: Self-pay | Admitting: *Deleted

## 2017-07-31 ENCOUNTER — Telehealth: Payer: Self-pay | Admitting: *Deleted

## 2017-07-31 NOTE — Telephone Encounter (Signed)
Incoming letter received from the [facility] informing Dr.Reed of a complaint the Board received from patient.   Complaint is not considered an investigations and does not have to be reported, notification is for your information only.  Letter placed in Dr.Reed's review and sign folder.

## 2017-07-31 NOTE — Telephone Encounter (Signed)
Patient called and stated that he wants you to cancel his Baclofen and wants to go back on Valium 10mg . Stated that the opiates do NOT destroy the brain because he can tell you everything since when he was 67 years old. Stated that these medications have been prescribed by us for the last 6-7 years and he can prove it. Stated "you need to cut the Valium back on before he has to go to someone higher than you."  Patient stated that he has no reason to really go on. Stated that if I would have saw him at his last appointment I could tell that to due to so much weight lost. Please Advise.

## 2017-08-01 ENCOUNTER — Telehealth: Payer: Self-pay | Admitting: *Deleted

## 2017-08-01 DIAGNOSIS — G039 Meningitis, unspecified: Secondary | ICD-10-CM

## 2017-08-01 NOTE — Telephone Encounter (Signed)
Patient called requesting his Dilaudid for tomorrow. Stated the refill falls on the weekend. (I will Pend Rx tomorrow for Dr. Renato Gailseed. NCCSRS Verified)  Patient also is requesting that the Baclofen be canceled and him started back on Valium 10mg  instead. Stated it works better.  And stated that his stomach is better and able to eat now.   Please Advise.

## 2017-08-01 NOTE — Telephone Encounter (Signed)
The complaint is issued under Dr. Thomasene LotGreen's name, not mine.  It states that his new doctor refuses to provide care for him or give him his medications.  Review of the chart indicates clearly the rationale for this.

## 2017-08-01 NOTE — Telephone Encounter (Signed)
Please call him tomorrow with response.  The baclofen is safer than the valium which is no longer recommended in geriatric patients.  Dilaudid can be filled tomorrow with a reduced number of tablets (I will modify after it's pended tomorrow).

## 2017-08-02 MED ORDER — HYDROMORPHONE HCL 8 MG PO TABS: 8.0000 mg | ORAL_TABLET | Freq: Three times a day (TID) | ORAL | 0 refills | Status: DC | PRN

## 2017-08-02 NOTE — Telephone Encounter (Signed)
I'm sorry to hear that he feels his life is coming to an end as a result of a change to his muscle relaxers.  I suggested he seek psychotherapy at his last appointment.  His CT abdomen and pelvis from the ED did not show an explanation for his weight loss and he's been referred out to address his abdominal pain, decreased po intake--to see GI.  He also reported that those symptoms were better in his previous phone call.  He was sent to cardiology, as well, for his tachycardia.  I have recommended he see pain management and he's refused this service again and again.  As I've said, I do not feel comfortable continuing his high doses of opioid therapy myself so I've been tapering the medications due to overdose risk and logic that some of these miniscule doses of less potent medications cannot be helpful with methadone and dilaudid on board.  I'm concerned about diversion behavior as he's been talking about not being stable financially lately.  Also, his benzodiazepine load has been reduced due to overdose risk.  It seems that he is threatening me repeatedly with reporting me to the newspaper and "higher levels".  I have provided him the best care possible based on the guidelines for opioid therapy.  His opioid dependence and addiction are negatively influencing his behavior.  I will ask our office manager to send him a certified letter of discharge from the practice due to these threats and nonadherence with my recommendations.    Kyonna Frier L. Britteny Fiebelkorn, D.O. Geriatrics [facility] [address] [place], [place] [address] Cell Phone (Mon-Fri 8am-5pm):  [phone] On Call:  [phone] & follow prompts after 5pm & weekends Office Phone:  [phone] Office Fax:  [phone]

## 2017-08-02 NOTE — Telephone Encounter (Signed)
Patient notified and stated that you have now written his death sentence. Stated that you don't even know what's wrong with him. Stated he can't even get by with what he is taking now.   NCCSRS Database verified Pharmacy confirmed  Pended Rx and sent to Dr. Renato Gailseed for approval.

## 2017-08-05 ENCOUNTER — Encounter: Payer: Self-pay | Admitting: Internal Medicine

## 2017-08-06 ENCOUNTER — Telehealth: Payer: Self-pay | Admitting: Internal Medicine

## 2017-08-06 ENCOUNTER — Other Ambulatory Visit: Payer: Self-pay | Admitting: Internal Medicine

## 2017-08-06 NOTE — Telephone Encounter (Signed)
[name] regarding dismissal from [facility].  Stated the pharmacy informed him of this information.  Stated his medication was denied. I informed Onalee HuaDavid I would check on this.  I called the [facility], request to refill the meds denied per electronic system. Per approved by the  clinical intake (Chrae). I called [name], told him, the medications was denied in error.  His medication should not have been denied, There is a problem in the system, once patient status change in the Epic system,  The system  automatically deny the medication. He also wanted to discuss why he was discharged. Told [name], because of the threats and nonadherence with Dr. Renato Gailseed recommendations. [name] stated he did not understand, but wanted to confirm his medication would be approved.  Informed [name],  any medications Dr. Renato Gailseed has  prescribing now, he would get a refill for the next 30 days as prescribed. Informed patient, his medical records is ready for pick up.  cda

## 2017-08-07 ENCOUNTER — Other Ambulatory Visit: Payer: Self-pay | Admitting: *Deleted

## 2017-08-07 DIAGNOSIS — F5101 Primary insomnia: Secondary | ICD-10-CM

## 2017-08-07 DIAGNOSIS — G8929 Other chronic pain: Secondary | ICD-10-CM

## 2017-08-07 DIAGNOSIS — J40 Bronchitis, not specified as acute or chronic: Secondary | ICD-10-CM

## 2017-08-07 MED ORDER — TEMAZEPAM 7.5 MG PO CAPS: ORAL_CAPSULE | ORAL | 0 refills | Status: AC

## 2017-08-07 MED ORDER — PROCHLORPERAZINE MALEATE 10 MG PO TABS: ORAL_TABLET | ORAL | 0 refills | Status: DC

## 2017-08-07 MED ORDER — ALBUTEROL SULFATE HFA 108 (90 BASE) MCG/ACT IN AERS: INHALATION_SPRAY | RESPIRATORY_TRACT | 0 refills | Status: AC

## 2017-08-07 MED ORDER — BACLOFEN 10 MG PO TABS: ORAL_TABLET | ORAL | 0 refills | Status: AC

## 2017-08-07 MED ORDER — DICLOFENAC SODIUM 1 % TD GEL: TRANSDERMAL | 0 refills | Status: AC

## 2017-08-07 NOTE — Telephone Encounter (Signed)
Patient has been dismissed from Practice. Called [facility] and spoke with Selena BattenKim (pharmacist) and went over Current medication list with her. Per pharmacy patient DOES NOT take Losartan or Omeprazole.  Patient has refill on the Levothyroxine and Minoxidil. Controlled substances are not due at this time. Other medications filled for 30 days.

## 2017-08-08 ENCOUNTER — Telehealth: Payer: Self-pay | Admitting: Internal Medicine

## 2017-08-08 NOTE — Telephone Encounter (Signed)
Patient stopped by the office today to pick up his medical records that were ready for him at the front desk.

## 2017-08-09 ENCOUNTER — Other Ambulatory Visit: Payer: Self-pay | Admitting: *Deleted

## 2017-08-09 ENCOUNTER — Other Ambulatory Visit: Payer: Self-pay | Admitting: Internal Medicine

## 2017-08-09 DIAGNOSIS — G894 Chronic pain syndrome: Secondary | ICD-10-CM

## 2017-08-09 DIAGNOSIS — G039 Meningitis, unspecified: Secondary | ICD-10-CM

## 2017-08-09 MED ORDER — METHADONE HCL 10 MG PO TABS: ORAL_TABLET | ORAL | 0 refills | Status: DC

## 2017-08-09 NOTE — Telephone Encounter (Signed)
Patient requested NCCSRS Database Verified Pharmacy Confirmed Pended Rx and sent to Dr. Reed for approval.  

## 2017-08-12 ENCOUNTER — Other Ambulatory Visit: Payer: Self-pay | Admitting: *Deleted

## 2017-08-12 MED ORDER — METHYLNALTREXONE BROMIDE 150 MG PO TABS: 4.0000 | ORAL_TABLET | Freq: Every day | ORAL | 5 refills | Status: AC

## 2017-08-12 NOTE — Telephone Encounter (Signed)
Patient is wanting a Rx called to pharmacy. Medication was not added to medication list. Confirming dosage. OV note states take 4 tablets daily. Is that Relistor 150mg  Four tablets daily. Please Advise.

## 2017-08-13 ENCOUNTER — Telehealth: Payer: Self-pay | Admitting: *Deleted

## 2017-08-13 NOTE — Telephone Encounter (Signed)
Received fax from [employer] #[phone] stating Relistor is APPROVED from 07/16/17-08/13/18 Member ID: [id number]

## 2017-08-13 NOTE — Telephone Encounter (Signed)
[facility] [employer]. Initiated through [employer]. Sent to determination 48-72 hours. Awaiting determination. Key: [id number] ID: [id number]

## 2017-08-19 NOTE — Telephone Encounter (Signed)
Forwared to Dr. Chilton SiGreen in May 2017..Austin Barnes

## 2017-08-19 NOTE — Telephone Encounter (Signed)
Error..cdavis °

## 2017-08-20 ENCOUNTER — Telehealth: Payer: Self-pay | Admitting: *Deleted

## 2017-08-20 NOTE — Telephone Encounter (Signed)
Patient called and left message on Clinical Intake stating that he was calling to apologize to me if he threatened me, stated "if you gone through what I gone through since I was 17 you would understand."  Stated that he needs a MRI of his stomach and didn't understand why a GI referral was made when he wanted a MRI. Please Advise.

## 2017-08-20 NOTE — Telephone Encounter (Signed)
Noted.  For documentation only:  MRI was never discussed.  Pt requested CT scan which got done in the ED when he went there for abdominal pain and it did not explain any pain he's had so GI referral was placed as discussed with him (and he agreed to it) at his last appt.  Since then, he said he did not need that referral.  Pt has been discharged from our practice and he is calling about chronic issues at this point.  We will treat him only for emergent conditions until his 30 days from the day he received the certified letter Austin Barnes(Cynthia should have that date--I copied her on this message).

## 2017-08-21 NOTE — Telephone Encounter (Signed)
Patient called today and left message on Clinical intake wanting a call back with a MRI. Is it ok to return his call to let him know we are only treating him for emergent conditions. Please Advise.

## 2017-08-22 ENCOUNTER — Other Ambulatory Visit: Payer: Self-pay | Admitting: Internal Medicine

## 2017-08-22 NOTE — Telephone Encounter (Signed)
Yes, please.

## 2017-08-22 NOTE — Telephone Encounter (Signed)
LMOM for patient to return call.

## 2017-08-22 NOTE — Telephone Encounter (Addendum)
Phone just rings, no answering machine pick up. Will try again later.

## 2017-08-23 ENCOUNTER — Other Ambulatory Visit: Payer: Self-pay | Admitting: *Deleted

## 2017-08-23 ENCOUNTER — Other Ambulatory Visit: Payer: Self-pay | Admitting: Internal Medicine

## 2017-08-23 ENCOUNTER — Telehealth: Payer: Self-pay | Admitting: *Deleted

## 2017-08-23 MED ORDER — DIAZEPAM 2 MG PO TABS: ORAL_TABLET | ORAL | 0 refills | Status: DC

## 2017-08-23 MED ORDER — PREGABALIN 25 MG PO CAPS: 25.0000 mg | ORAL_CAPSULE | Freq: Two times a day (BID) | ORAL | 0 refills | Status: DC

## 2017-08-23 NOTE — Telephone Encounter (Signed)
Patient requested NCCSRS Database Verified Pharmacy Confirmed Pended Rx and sent to Dr. Reed for approval.  

## 2017-08-23 NOTE — Telephone Encounter (Signed)
Patient notified

## 2017-08-23 NOTE — Telephone Encounter (Signed)
Patient notified and agreed.  

## 2017-08-23 NOTE — Telephone Encounter (Signed)
LMOM to return call.

## 2017-08-23 NOTE — Telephone Encounter (Signed)
Patient called and stated that a friend recommended a medication called Lyrica for his feet pain. Stated that his feet hurt to the point its hard to walk on them. Patient is wanting to try the Lyrica. Would like for you to send in Rx for this to his pharmacy. Please Advise.

## 2017-08-23 NOTE — Telephone Encounter (Signed)
I did send this to his pharmacy.  Please again note that we are only addressing emergent issues 30 days from the day he received the certified letter.  Aram BeechamCynthia, please clarify when he received the letter.

## 2017-08-26 DIAGNOSIS — G890 Central pain syndrome: Secondary | ICD-10-CM | POA: Diagnosis not present

## 2017-08-26 DIAGNOSIS — M961 Postlaminectomy syndrome, not elsewhere classified: Secondary | ICD-10-CM | POA: Diagnosis not present

## 2017-08-26 DIAGNOSIS — M5136 Other intervertebral disc degeneration, lumbar region: Secondary | ICD-10-CM | POA: Diagnosis not present

## 2017-08-26 DIAGNOSIS — M545 Low back pain: Secondary | ICD-10-CM | POA: Diagnosis not present

## 2017-08-27 ENCOUNTER — Other Ambulatory Visit: Payer: Self-pay | Admitting: Internal Medicine

## 2017-08-27 ENCOUNTER — Telehealth: Payer: Self-pay | Admitting: *Deleted

## 2017-08-27 NOTE — Telephone Encounter (Signed)
Patient called and left message on Clinical Intake stating:  1. That the lowest dose of Lyrica was called in. Patient is wondering if you could give him 50mg  with Refills.   2.Stated that he cannot get into a Pain Clinic because no one will accept him because all his records are wrong. He wants to sit down with you to discuss all that is wrong with his chart so it can be corrected.   3. Patient is still requesting to have an MRI done. Stated that he has apologized for anything he may have said that you took as a threat. Stated that "it is between me and God why I don't want to live."  Please Advise.

## 2017-08-28 NOTE — Telephone Encounter (Signed)
I'm sorry that he feels his records were never complete when [facility] did not allow Dr. Chilton SiGreen to [facility] those [facility].  There is nothing we can do about that at this time.   He should be able to call the many different primary care offices throughout [place] to request to establish as a new patient.   Hydromorphone will be refilled once more on Friday.  Please pend for me on Friday.

## 2017-08-28 NOTE — Telephone Encounter (Signed)
LMOM to return call.

## 2017-08-28 NOTE — Telephone Encounter (Signed)
All of these are chronic concerns.  We are available only for emergencies until 30 days from when he received the certified letter.  No further orders at this time.

## 2017-08-28 NOTE — Telephone Encounter (Signed)
Patient notified. Patient states he has no Dr who will take him nor pain clinic due to records being wrong. Patient is requesting to sit down with you and discuss these records. Patient stated "I shouldn't said what I did, but I thought Dr. Renato Gailseed would take care of me when Dr. Chilton SiGreen left."  States his Hydromorphone is due this Saturday and wants to know if you will give him a Rx for it on Friday. Stated he didn't get his letter till 08/05/17. Please Advise.

## 2017-08-29 ENCOUNTER — Other Ambulatory Visit: Payer: Self-pay | Admitting: Internal Medicine

## 2017-08-29 ENCOUNTER — Telehealth: Payer: Self-pay

## 2017-08-29 NOTE — Telephone Encounter (Signed)
Per management patient will need to see Austin ArntJessica Eubank's today for an urgent concern otherwise concern not considered urgent if it can wait until Monday.   I called patient to discuss the above, no answer. I left message for patient to return call when available

## 2017-08-29 NOTE — Telephone Encounter (Signed)
Spoke with patient and advised he would need to come in to see Shanda BumpsJessica, NP today if he has a urgent concern, that it can't wait until Monday. Pt states this is not an urgent concern and he would rather see Dr. Renato Gailseed. I advised he could only see dr reed for urgent concerns and she doesn't have any openings today. Pt then states to just cancel the appt and he hung up.

## 2017-08-29 NOTE — Telephone Encounter (Signed)
Message left on clinical intake voicemail:   Patient returned Austin Barnes's call  I called patient back and left detailed message (ok per DPR) with Dr.Reed's Response. Patient instructed to call back if questions or concerns.

## 2017-08-29 NOTE — Telephone Encounter (Signed)
Patient called c/o abdominal pain and states this would be considered urgent and he would like to see Dr.Reed.  Patient aware Dr.Reed dose not have any available appointments and I offered for him to see Shanda BumpsJessica (patient refused). Patient asked for Dr.Reed's next available.  I informed patient he could see Dr.Reed Monday at 1 pm, patient agreed

## 2017-08-30 ENCOUNTER — Other Ambulatory Visit: Payer: Self-pay | Admitting: Internal Medicine

## 2017-08-30 ENCOUNTER — Other Ambulatory Visit: Payer: Self-pay | Admitting: *Deleted

## 2017-08-30 DIAGNOSIS — G039 Meningitis, unspecified: Secondary | ICD-10-CM

## 2017-08-30 MED ORDER — HYDROMORPHONE HCL 8 MG PO TABS: 8.0000 mg | ORAL_TABLET | Freq: Three times a day (TID) | ORAL | 0 refills | Status: DC | PRN

## 2017-08-30 NOTE — Telephone Encounter (Signed)
Patient requested NCCSRS Database Verified Pharmacy Confirmed Pended Rx and sent to Dr. Reed for approval.  

## 2017-09-02 ENCOUNTER — Other Ambulatory Visit: Payer: Self-pay | Admitting: Internal Medicine

## 2017-09-02 ENCOUNTER — Other Ambulatory Visit: Payer: Self-pay | Admitting: *Deleted

## 2017-09-02 DIAGNOSIS — E349 Endocrine disorder, unspecified: Secondary | ICD-10-CM

## 2017-09-02 MED ORDER — NITROGLYCERIN 2 % TD OINT: TOPICAL_OINTMENT | TRANSDERMAL | 0 refills | Status: AC

## 2017-09-02 MED ORDER — TESTOSTERONE 20.25 MG/ACT (1.62%) TD GEL: TRANSDERMAL | 0 refills | Status: AC

## 2017-09-02 NOTE — Telephone Encounter (Signed)
BIN [id number] PCN MEDDADV ID [id number]

## 2017-09-02 NOTE — Telephone Encounter (Signed)
Received fax from [employer] #[phone] and Medication was APPROVED 07/16/17-09/02/18 [facility] Notified.

## 2017-09-02 NOTE — Telephone Encounter (Signed)
Patient requested. [place]. Patient due. Androgel needs prior authorization. Initiated through [employer] [id number] [facility]

## 2017-09-05 ENCOUNTER — Other Ambulatory Visit: Payer: Self-pay | Admitting: Internal Medicine

## 2017-09-05 DIAGNOSIS — G039 Meningitis, unspecified: Secondary | ICD-10-CM

## 2017-09-05 DIAGNOSIS — G894 Chronic pain syndrome: Secondary | ICD-10-CM

## 2017-09-05 MED ORDER — METHADONE HCL 10 MG PO TABS: ORAL_TABLET | ORAL | 0 refills | Status: DC

## 2017-09-05 NOTE — Telephone Encounter (Signed)
Reduced to 2 tabs from 3 tabs every 6 hrs as needed for pain

## 2017-09-05 NOTE — Telephone Encounter (Signed)
Patient called for narcotic refill for methadone, [place]CC[place] database verified; pharmacy confirmed; pended Rx sent to Dr Renato Gailseed for approval

## 2017-09-06 NOTE — Telephone Encounter (Signed)
Patient notified that Rx was sent to pharmacy.  

## 2017-09-07 ENCOUNTER — Other Ambulatory Visit: Payer: Self-pay | Admitting: Internal Medicine

## 2017-09-07 DIAGNOSIS — F5101 Primary insomnia: Secondary | ICD-10-CM

## 2017-09-09 ENCOUNTER — Other Ambulatory Visit: Payer: Self-pay | Admitting: Internal Medicine

## 2017-09-09 DIAGNOSIS — F5101 Primary insomnia: Secondary | ICD-10-CM

## 2017-09-19 ENCOUNTER — Telehealth: Payer: Self-pay | Admitting: *Deleted

## 2017-09-19 NOTE — Telephone Encounter (Signed)
Spoke with patient and advised we can not refill any medications.

## 2017-09-19 NOTE — Telephone Encounter (Signed)
Patient called upset. Patient has been Dismissed from practice but stated that he cannot get in with another Provider for another 3 months. Stated he needs his medication. 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 Stated he needs to get his Rx's until he gets in with someone else. Stated that he is still having problems with Abdominal pain and needs something done.  Please Advise.

## 2017-09-19 NOTE — Telephone Encounter (Signed)
He has been dismissed from our practice and was given a 30 day notice to find another PCP.  We are unable to prescribe any further medications for him.

## 2017-09-23 ENCOUNTER — Telehealth: Payer: Self-pay | Admitting: Family Medicine

## 2017-09-23 NOTE — Telephone Encounter (Signed)
Copied from CRM [phone]. Topic: Appointment Scheduling - Prior Auth Required for Appointment >> Sep 23, 2017 10:22 AM Herby AbrahamJohnson, Shiquita C wrote: No appointment has been scheduled. Patient is requesting 09/25/17 at 1:00 appointment with Abbe AmsterdamShannon Banks. Per scheduling protocol, this appointment requires a prior authorization prior to scheduling.- pt called in to establish care. Per One note provider doesn't see back to back np. Pt is taking a few medications that is dyer to his health that he is now out of and is looking to get in to be established and seen sooner than later. Please advise if okay for scheduling.   CB: [phone] - Okay to lvm if no answer. He says that he have a time getting to the phone at times.   Route to department's PEC pool.

## 2017-09-23 NOTE — Telephone Encounter (Signed)
Unfortunately, I am not willing to take on this patient.  Per chart review he was dismissed from his last practice for communicating threats and nonadherence to medication.  It is advised that he finds another provider to attempt to establish care with.  Furthermore this provider does not prescribe benzos.

## 2017-09-23 NOTE — Telephone Encounter (Signed)
Ok to schedule pt? Please advise

## 2017-09-24 NOTE — Telephone Encounter (Signed)
Pt aware that Dr. Salomon FickBanks is unable to take this pt at this time. Advised to establish with another provider.

## 2017-09-27 DIAGNOSIS — M542 Cervicalgia: Secondary | ICD-10-CM | POA: Diagnosis not present

## 2017-09-27 DIAGNOSIS — G890 Central pain syndrome: Secondary | ICD-10-CM | POA: Diagnosis not present

## 2017-09-27 DIAGNOSIS — M5412 Radiculopathy, cervical region: Secondary | ICD-10-CM | POA: Diagnosis not present

## 2017-09-27 DIAGNOSIS — M545 Low back pain: Secondary | ICD-10-CM | POA: Diagnosis not present

## 2017-09-27 DIAGNOSIS — R202 Paresthesia of skin: Secondary | ICD-10-CM | POA: Diagnosis not present

## 2017-09-30 ENCOUNTER — Telehealth: Payer: Self-pay | Admitting: *Deleted

## 2017-09-30 NOTE — Telephone Encounter (Signed)
Patient called and stated that he needs a Dr. Charline BillsStated that he is out of his medications and due to his labeling with our practice no one will accept him as patient.   Informed him of Dr. Ernest Mallickeed's response from 09/19/17. "He has been dismissed from our practice and was given a 30 day notice to find another PCP. We are unable to prescribe any further medications to him."

## 2017-10-01 ENCOUNTER — Ambulatory Visit: Payer: Medicare Other | Admitting: Student

## 2017-10-01 DIAGNOSIS — E039 Hypothyroidism, unspecified: Secondary | ICD-10-CM | POA: Diagnosis not present

## 2017-10-01 DIAGNOSIS — I10 Essential (primary) hypertension: Secondary | ICD-10-CM | POA: Diagnosis not present

## 2017-10-01 DIAGNOSIS — G8929 Other chronic pain: Secondary | ICD-10-CM | POA: Diagnosis not present

## 2017-10-01 DIAGNOSIS — E291 Testicular hypofunction: Secondary | ICD-10-CM | POA: Diagnosis not present

## 2017-10-03 DIAGNOSIS — F0630 Mood disorder due to known physiological condition, unspecified: Secondary | ICD-10-CM | POA: Diagnosis not present

## 2017-10-03 DIAGNOSIS — E119 Type 2 diabetes mellitus without complications: Secondary | ICD-10-CM | POA: Diagnosis not present

## 2017-10-03 DIAGNOSIS — Z0000 Encounter for general adult medical examination without abnormal findings: Secondary | ICD-10-CM | POA: Diagnosis not present

## 2017-10-03 DIAGNOSIS — D649 Anemia, unspecified: Secondary | ICD-10-CM | POA: Diagnosis not present

## 2017-10-03 DIAGNOSIS — I10 Essential (primary) hypertension: Secondary | ICD-10-CM | POA: Diagnosis not present

## 2017-10-03 DIAGNOSIS — E039 Hypothyroidism, unspecified: Secondary | ICD-10-CM | POA: Diagnosis not present

## 2017-10-04 ENCOUNTER — Other Ambulatory Visit: Payer: Self-pay | Admitting: Internal Medicine

## 2017-10-10 ENCOUNTER — Other Ambulatory Visit: Payer: Self-pay | Admitting: Internal Medicine

## 2017-10-10 DIAGNOSIS — F0630 Mood disorder due to known physiological condition, unspecified: Secondary | ICD-10-CM | POA: Diagnosis not present

## 2017-10-10 DIAGNOSIS — E039 Hypothyroidism, unspecified: Secondary | ICD-10-CM | POA: Diagnosis not present

## 2017-10-10 DIAGNOSIS — E291 Testicular hypofunction: Secondary | ICD-10-CM | POA: Diagnosis not present

## 2017-10-10 DIAGNOSIS — M545 Low back pain: Secondary | ICD-10-CM | POA: Diagnosis not present

## 2017-11-04 DIAGNOSIS — Z6823 Body mass index (BMI) 23.0-23.9, adult: Secondary | ICD-10-CM | POA: Diagnosis not present

## 2017-11-04 DIAGNOSIS — G8929 Other chronic pain: Secondary | ICD-10-CM | POA: Diagnosis not present

## 2017-11-04 DIAGNOSIS — G4700 Insomnia, unspecified: Secondary | ICD-10-CM | POA: Diagnosis not present

## 2017-11-06 ENCOUNTER — Other Ambulatory Visit: Payer: Self-pay | Admitting: Internal Medicine

## 2017-11-06 DIAGNOSIS — E349 Endocrine disorder, unspecified: Secondary | ICD-10-CM

## 2017-11-13 ENCOUNTER — Emergency Department (HOSPITAL_COMMUNITY): Payer: Medicare Other

## 2017-11-13 ENCOUNTER — Emergency Department (HOSPITAL_COMMUNITY)
Admission: EM | Admit: 2017-11-13 | Discharge: 2017-11-13 | Disposition: A | Discharge status: Home / Self Care | Payer: Medicare Other | Attending: Emergency Medicine | Admitting: Emergency Medicine

## 2017-11-13 ENCOUNTER — Other Ambulatory Visit: Payer: Self-pay

## 2017-11-13 ENCOUNTER — Encounter (HOSPITAL_COMMUNITY): Payer: Self-pay | Admitting: *Deleted

## 2017-11-13 DIAGNOSIS — Z79899 Other long term (current) drug therapy: Secondary | ICD-10-CM | POA: Diagnosis not present

## 2017-11-13 DIAGNOSIS — R1013 Epigastric pain: Secondary | ICD-10-CM | POA: Insufficient documentation

## 2017-11-13 DIAGNOSIS — R1011 Right upper quadrant pain: Secondary | ICD-10-CM | POA: Diagnosis not present

## 2017-11-13 DIAGNOSIS — E039 Hypothyroidism, unspecified: Secondary | ICD-10-CM | POA: Diagnosis not present

## 2017-11-13 DIAGNOSIS — F17210 Nicotine dependence, cigarettes, uncomplicated: Secondary | ICD-10-CM | POA: Diagnosis not present

## 2017-11-13 DIAGNOSIS — R109 Unspecified abdominal pain: Secondary | ICD-10-CM | POA: Diagnosis present

## 2017-11-13 DIAGNOSIS — I10 Essential (primary) hypertension: Secondary | ICD-10-CM | POA: Insufficient documentation

## 2017-11-13 DIAGNOSIS — R002 Palpitations: Secondary | ICD-10-CM | POA: Diagnosis not present

## 2017-11-13 DIAGNOSIS — E119 Type 2 diabetes mellitus without complications: Secondary | ICD-10-CM | POA: Diagnosis not present

## 2017-11-13 LAB — BASIC METABOLIC PANEL
Anion gap: 13 (ref 5–15)
BUN: 11 mg/dL (ref 6–20)
CO2: 25 mmol/L (ref 22–32)
CREATININE: 1.05 mg/dL (ref 0.61–1.24)
Calcium: 9.7 mg/dL (ref 8.9–10.3)
Chloride: 105 mmol/L (ref 101–111)
GFR calc Af Amer: >60 mL/min (ref >60)
GLUCOSE: 119 mg/dL — AB (ref 65–99)
Potassium: 3.5 mmol/L (ref 3.5–5.1)
SODIUM: 143 mmol/L (ref 135–145)

## 2017-11-13 LAB — CBC
HCT: 38.5 % — ABNORMAL LOW (ref 39.0–52.0)
Hemoglobin: 12.2 g/dL — ABNORMAL LOW (ref 13.0–17.0)
MCH: 26.5 pg (ref 26.0–34.0)
MCHC: 31.7 g/dL (ref 30.0–36.0)
MCV: 83.5 fL (ref 78.0–100.0)
PLATELETS: 295 10*3/uL (ref 150–400)
RBC: 4.61 MIL/uL (ref 4.22–5.81)
RDW: 14.2 % (ref 11.5–15.5)
WBC: 5.9 10*3/uL (ref 4.0–10.5)

## 2017-11-13 LAB — HEPATIC FUNCTION PANEL
ALK PHOS: 95 U/L (ref 38–126)
ALT: 13 U/L — ABNORMAL LOW (ref 17–63)
AST: 20 U/L (ref 15–41)
Albumin: 3.9 g/dL (ref 3.5–5.0)
TOTAL PROTEIN: 7.5 g/dL (ref 6.5–8.1)
Total Bilirubin: 0.6 mg/dL (ref 0.3–1.2)

## 2017-11-13 LAB — LIPASE, BLOOD: LIPASE: 30 U/L (ref 11–51)

## 2017-11-13 LAB — I-STAT TROPONIN, ED: Troponin i, poc: 0.01 ng/mL (ref 0.00–0.08)

## 2017-11-13 MED ORDER — SUCRALFATE 1 G PO TABS: 1.0000 g | ORAL_TABLET | Freq: Three times a day (TID) | ORAL | 1 refills | Status: DC

## 2017-11-13 MED ORDER — HYDROMORPHONE HCL 1 MG/ML IJ SOLN
1.0000 mg | Freq: Once | INTRAMUSCULAR | Status: AC
  Administered 2017-11-13: 1 mg via INTRAVENOUS
  Filled 2017-11-13: qty 1

## 2017-11-13 NOTE — ED Triage Notes (Signed)
Pt complains of abdominal pain and heart palpitations for the past week. Pt states he had similar symptoms before in December.

## 2017-11-13 NOTE — ED Notes (Signed)
Lab aware to add on Lipase and Hepatic function

## 2017-11-13 NOTE — Discharge Instructions (Addendum)
We saw in the ER for abdominal pain and palpitations. All the results in the ER are normal, labs and imaging. We are not sure what is causing your symptoms.  We suspect that you might have stomach ulcers and starting you on the medications for that.  The workup in the ER is not complete, and is limited to screening for life threatening and emergent conditions only, so please see a GI doctor and a cardiologist for further evaluation.  Return to the ER immediately if you start having worsening of your abdominal pain, bloody stools or bloody vomiting.  Also come back to the ER if you have any fainting episodes with your palpitations or severe chest pain or shortness of breath.

## 2017-11-13 NOTE — ED Provider Notes (Signed)
[facility]-EMERGENCY DEPT Provider Note   CSN: [id number] Arrival date & time: 11/13/17  0751     History   Chief Complaint Chief Complaint  Patient presents with  . Abdominal Pain  . Palpitations    HPI Austin Barnes is a 67 y.o. male.  HPI   67 year old male with history of diabetes, chronic pancreatitis, abdominal surgery, peripheral vascular disease, testosterone use comes in with chief complaint of palpitations and abdominal pain.  Patient states that his symptoms have been going on for about a week.  Palpitations are worse at nighttime and last for about an hour.  Patient denies associated chest pain, near-syncope.  Patient has no history of similar pain in the past.  Patient also complains of abdominal pain.  Abdominal pain has been going on for about a 1 week.  As well and is located in the epigastric region.  Pain is nonradiating and is worse with food intake.  Pain lasts typically for about an hour and then resolves.  Patient smokes about 5 cigarettes a day.   Past Medical History:  Diagnosis Date  . 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 Anemia   . Arthritis   . Chronic pain   . 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 DDD (degenerative disc disease), cervical    Patient reported   . DDD (degenerative disc disease), lumbar    Patient reported   . DDD (degenerative disc disease), thoracic    Patient Reported   . DVT (deep venous thrombosis) (HCC)   . Heart murmur   . Hiatal hernia   . Hypothyroidism 12/14/2014  . Impotence   . Other and unspecified hyperlipidemia   . 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 Peripheral vascular disease (HCC)   . Pneumonia    double pneumonia in High School  . Reflux   . Ruptured disc, cervical    Patient reported   . Ruptured disc, thoracic    Patient reported   . Spinal stenosis, thoracic    Patient reported   . Stenosis of lumbosacral spine    Patient reported     . Stenosis, cervical spine    Patient reported   . Testosterone insufficiency 2013  . Ulcer    Peptic disease  . Unspecified essential hypertension     Patient Active Problem List   Diagnosis Date Noted  . 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 Shoulder pain 10/30/2016  . 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 Leg pain 08/21/2016  . Tremor 08/21/2016  . Cough 07/06/2016  . Acute bronchitis 04/18/2016  . Loss of weight 12/14/2015  . Weak 12/14/2015  . Myalgia and myositis 12/14/2015  . Frequency of urination 12/14/2015  . Hypothyroidism 12/14/2014  . 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 Malaise 01/27/2014  . Hip pain 01/27/2014  . Pain in left elbow 01/27/2014  . Ganglion cyst 06/23/2013  . Chronic pain 04/08/2013  . 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 Arthritis   . Essential hypertension   . Hiatal hernia   . Peripheral vascular disease (HCC)   . GERD (gastroesophageal reflux disease)   . Anemia   . Testosterone insufficiency   . Hyperlipidemia     Past Surgical History:  Procedure Laterality Date  . BACK SURGERY     15 back surgeries, starting at age 6   . CARPAL TUNNEL RELEASE Right 1995  . GASTROJEJUNOSTOMY    .  KIDNEY STONE SURGERY    . LEG SURGERY     right leg  . SPINE SURGERY    . TRUNCAL VAGOTOMY  1995        Home Medications    Prior to Admission medications   Medication Sig Start Date End Date Taking? Authorizing Provider  albuterol (VENTOLIN HFA) 108 (90 Base) MCG/ACT inhaler Use 2 puffs every 6 hours as needed for shortness of breath and wheezing 08/07/17  Yes Reed, Tiffany L, DO  baclofen (LIORESAL) 10 MG tablet Take one tablet by mouth three times daily as needed for muscle relaxation 08/07/17  Yes Reed, Tiffany L, DO  diclofenac sodium (VOLTAREN) 1 % GEL Apply 2-4gm to affected area up to 4 times daily 08/07/17  Yes Reed, Tiffany L, DO  levothyroxine (SYNTHROID,  LEVOTHROID) 75 MCG tablet Take one tablet by mouth 30 minutes before breakfast for thyroid 05/02/17  Yes Reed, Tiffany L, DO  losartan (COZAAR) 50 MG tablet Take 50 mg by mouth daily. 04/29/17  Yes [provider]  methadone (DOLOPHINE) 10 MG tablet Take two tablets by mouth every 6 hours as needed to control pain 09/05/17  Yes Reed, Tiffany L, DO  Methylnaltrexone Bromide (RELISTOR) 150 MG TABS Take 4 tablets by mouth daily. 08/12/17  Yes Reed, Tiffany L, DO  minoxidil (LONITEN) 10 MG tablet TAKE 1 TABLET DAILY TO CONTROL BLOOD PRESSURE. 05/02/17  Yes Reed, Tiffany L, DO  nitroGLYCERIN (NITRO-BID) 2 % ointment APPLY 1 INCH STRIP TO FEET TWICE DAILY. 09/02/17  Yes Reed, Tiffany L, DO  omeprazole (PRILOSEC) 20 MG capsule TAKE (2) CAPSULES TWICE DAILY. Patient taking differently: Take one capsule by mouth twice daily as needed for indigestion. 05/14/16  Yes Kimber Relic, MD  pregabalin (LYRICA) 25 MG capsule Take 1 capsule (25 mg total) by mouth 2 (two) times daily. 08/23/17  Yes Reed, Tiffany L, DO  prochlorperazine (COMPAZINE) 10 MG tablet Take one tablet by mouth every 8 hours as needed for nausea 08/07/17  Yes Reed, Tiffany L, DO  temazepam (RESTORIL) 15 MG capsule Take 15 mg by mouth daily. 11/04/17  Yes [provider]  Testosterone (ANDROGEL PUMP) 20.25 MG/ACT (1.62%) GEL Apply 1 Pump topically every other day AND 2 Pump every other day. On opposite days. 09/02/17  Yes Reed, Tiffany L, DO  diazepam (VALIUM) 2 MG tablet TAKE 1 TABLET EVERY 6 HOURS AS NEEDED FOR MUSCLE SPASM. Patient not taking: Reported on 11/13/2017 08/23/17   Bufford Spikes L, DO  HYDROmorphone (DILAUDID) 8 MG tablet Take 1 tablet (8 mg total) by mouth every 8 (eight) hours as needed for severe pain. No driving while taking this Patient not taking: Reported on 11/13/2017 08/30/17   Renato Gails, Tiffany L, DO  sucralfate (CARAFATE) 1 g tablet Take 1 tablet (1 g total) by mouth 4 (four) times daily -  with meals and at bedtime.  11/13/17   Derwood Kaplan, MD  temazepam (RESTORIL) 7.5 MG capsule Take one capsule by mouth once daily at bedtime as needed for rest Patient not taking: Reported on 11/13/2017 08/07/17   Kermit Balo, DO    Family History Family History  Problem Relation Age of Onset  . Hypertension Mother   . Stroke Father 52    Social History Social History   Tobacco Use  . Smoking status: Current Some Day Smoker    Packs/day: 0.25    Years: 3.00    Pack years: 0.75    Types: Cigarettes  . Smokeless tobacco: Former  User    Quit date: 07/17/2007  . Tobacco comment: Last quit date was 8-10 years ago as of 2018   Substance Use Topics  . Alcohol use: No    Alcohol/week: 0.0 oz  . Drug use: No     Allergies   Disalcid [salsalate]; Feldene [piroxicam]; Hctz [hydrochlorothiazide]; Other; Penicillins; Codeine; Demerol; and Sulfa antibiotics   Review of Systems Review of Systems  Constitutional: Positive for activity change.  Respiratory: Negative for chest tightness.   Cardiovascular: Positive for palpitations.  Gastrointestinal: Positive for anal bleeding.  Allergic/Immunologic: Negative for immunocompromised state.  Hematological: Does not bruise/bleed easily.  All other systems reviewed and are negative.    Physical Exam Updated Vital Signs BP (!) 160/90   Pulse 75   Temp 99.2 F (37.3 C) (Oral)   Resp 13   SpO2 98%   Physical Exam  Constitutional: He is oriented to person, place, and time. He appears well-developed.  HENT:  Head: Atraumatic.  Neck: Neck supple.  Cardiovascular: Normal rate and intact distal pulses.  No murmur heard. Pulmonary/Chest: Effort normal.  Abdominal: Bowel sounds are normal. There is tenderness in the right upper quadrant and epigastric area. There is guarding. There is no rigidity and no rebound.  Neurological: He is alert and oriented to person, place, and time.  Skin: Skin is warm.  Nursing note and vitals reviewed.    ED Treatments /  Results  Labs (all labs ordered are listed, but only abnormal results are displayed) Labs Reviewed  BASIC METABOLIC PANEL - Abnormal; Notable for the following components:      Result Value   Glucose, Bld 119 (*)    All other components within normal limits  CBC - Abnormal; Notable for the following components:   Hemoglobin 12.2 (*)    HCT 38.5 (*)    All other components within normal limits  HEPATIC FUNCTION PANEL - Abnormal; Notable for the following components:   ALT 13 (*)    Bilirubin, Direct <0.1 (*)    All other components within normal limits  LIPASE, BLOOD  I-STAT TROPONIN, ED    EKG EKG Interpretation  Date/Time:  Wednesday Nov 13 2017 08:01:35 EDT Ventricular Rate:  97 PR Interval:    QRS Duration: 90 QT Interval:  376 QTC Calculation: 478 R Axis:   72 Text Interpretation:  Sinus rhythm LAE, consider biatrial enlargement Borderline prolonged QT interval No acute changes Nonspecific ST and T wave abnormality Confirmed by Derwood Kaplan ([id number]) on 11/13/2017 8:25:42 AM   Radiology Dg Chest 2 View  Result Date: 11/13/2017 CLINICAL DATA:  Heart palpitations. EXAM: CHEST - 2 VIEW COMPARISON:  03/22/2017 FINDINGS: Heart and mediastinal contours are within normal limits. No focal opacities or effusions. No acute bony abnormality. IMPRESSION: No active cardiopulmonary disease. Electronically Signed   By: Charlett Nose M.D.   On: 11/13/2017 09:03    Procedures Procedures (including critical care time)  Medications Ordered in ED Medications  HYDROmorphone (DILAUDID) injection 1 mg (1 mg Intravenous Given 11/13/17 0851)     Initial Impression / Assessment and Plan / ED Course  I have reviewed the triage vital signs and the nursing notes.  Pertinent labs & imaging results that were available during my care of the patient were reviewed by me and considered in my medical decision making (see chart for details).     67 year old comes in with chief complaint of abdominal  pain and palpitations.  Both of the symptoms have been going on for a  week.  Patient's symptoms are intermittent.  The palpitations are provoked when patient lays flat and the abdominal pain is provoked with food intake.  Patient is not profoundly symptomatic with palpitations besides the generalized discomfort he has.  We will monitor patient on telemetry while he is in the ED.  During my evaluation there was no dysrhythmia appreciated.  Differential diagnosis would include SVT, A. fib, a flutter.  PE considered in the differential diagnosis given the history of DVT, however with intermittent episode of palpitations that are self resolving, pretest probability for PE is lower.  Patient's abdominal pain is generalized, but worse in the upper quadrants.  Symptoms are worse after p.o. intake.  Differential diagnosis would include peptic ulcer disease.  There is a small likelihood of this being anginal abdominal pain, only given the risk factor of thrombosis that patient has exhibited.  We will follow-up on the labs and perform serial abdominal exam to see if the CAT scan is indicated.  9:39 AM Patient reassessed.  Labs are all within normal limits.  Patient's telemetry monitoring has not revealed any episodes of dysrhythmia. Unfortunately, patient does not have a primary care doctor therefore I have advised him to follow-up with GI for what appears to be likely peptic ulcer disease and cardiology for the further evaluation of the palpitations.  Strict ER return precautions have been discussed and patient will return to the ER if his symptoms get worse or he starts having bloody vomiting, stools or if he gets symptomatic with the palpitations   Final Clinical Impressions(s) / ED Diagnoses   Final diagnoses:  Epigastric abdominal pain  Heart palpitations    ED Discharge Orders        Ordered    sucralfate (CARAFATE) 1 g tablet  3 times daily with meals & bedtime     11/13/17 0934         Derwood Kaplan, MD 11/13/17 [phone]

## 2017-11-24 ENCOUNTER — Emergency Department (HOSPITAL_COMMUNITY)
Admission: EM | Admit: 2017-11-24 | Discharge: 2017-11-24 | Disposition: A | Discharge status: Home / Self Care | Payer: Medicare Other | Attending: Emergency Medicine | Admitting: Emergency Medicine

## 2017-11-24 DIAGNOSIS — R109 Unspecified abdominal pain: Secondary | ICD-10-CM | POA: Insufficient documentation

## 2017-11-24 DIAGNOSIS — Z5321 Procedure and treatment not carried out due to patient leaving prior to being seen by health care provider: Secondary | ICD-10-CM | POA: Insufficient documentation

## 2017-11-24 NOTE — ED Notes (Signed)
Upon entering room to triage patient. Patient stated that if he had to wait a long time he would like to go home. Informed patient of wait times and that there is no guarantee on how long his wait may be. Patient stated he would go home.

## 2017-11-29 DIAGNOSIS — I10 Essential (primary) hypertension: Secondary | ICD-10-CM | POA: Diagnosis not present

## 2017-11-29 DIAGNOSIS — R55 Syncope and collapse: Secondary | ICD-10-CM | POA: Diagnosis not present

## 2017-11-29 DIAGNOSIS — G039 Meningitis, unspecified: Secondary | ICD-10-CM | POA: Diagnosis not present

## 2017-11-29 DIAGNOSIS — M5136 Other intervertebral disc degeneration, lumbar region: Secondary | ICD-10-CM | POA: Diagnosis not present

## 2017-11-29 DIAGNOSIS — Z882 Allergy status to sulfonamides status: Secondary | ICD-10-CM | POA: Diagnosis not present

## 2017-11-29 DIAGNOSIS — E039 Hypothyroidism, unspecified: Secondary | ICD-10-CM | POA: Diagnosis not present

## 2017-11-29 DIAGNOSIS — Z880 Allergy status to penicillin: Secondary | ICD-10-CM | POA: Diagnosis not present

## 2017-11-29 DIAGNOSIS — G8929 Other chronic pain: Secondary | ICD-10-CM | POA: Diagnosis not present

## 2017-11-29 DIAGNOSIS — R079 Chest pain, unspecified: Secondary | ICD-10-CM | POA: Diagnosis not present

## 2017-11-29 DIAGNOSIS — R634 Abnormal weight loss: Secondary | ICD-10-CM | POA: Diagnosis not present

## 2017-11-29 DIAGNOSIS — S3992XA Unspecified injury of lower back, initial encounter: Secondary | ICD-10-CM | POA: Diagnosis not present

## 2017-11-29 DIAGNOSIS — S299XXA Unspecified injury of thorax, initial encounter: Secondary | ICD-10-CM | POA: Diagnosis not present

## 2017-11-29 DIAGNOSIS — M6281 Muscle weakness (generalized): Secondary | ICD-10-CM | POA: Diagnosis not present

## 2017-11-29 DIAGNOSIS — M545 Low back pain: Secondary | ICD-10-CM | POA: Diagnosis not present

## 2017-11-29 DIAGNOSIS — K838 Other specified diseases of biliary tract: Secondary | ICD-10-CM | POA: Diagnosis not present

## 2017-11-29 DIAGNOSIS — R110 Nausea: Secondary | ICD-10-CM | POA: Diagnosis not present

## 2017-11-29 DIAGNOSIS — E119 Type 2 diabetes mellitus without complications: Secondary | ICD-10-CM | POA: Diagnosis not present

## 2017-11-29 DIAGNOSIS — Z885 Allergy status to narcotic agent status: Secondary | ICD-10-CM | POA: Diagnosis not present

## 2017-11-30 DIAGNOSIS — R55 Syncope and collapse: Secondary | ICD-10-CM | POA: Diagnosis not present

## 2017-11-30 DIAGNOSIS — E039 Hypothyroidism, unspecified: Secondary | ICD-10-CM | POA: Diagnosis not present

## 2017-11-30 DIAGNOSIS — G8929 Other chronic pain: Secondary | ICD-10-CM | POA: Diagnosis not present

## 2017-11-30 DIAGNOSIS — I517 Cardiomegaly: Secondary | ICD-10-CM | POA: Diagnosis not present

## 2017-11-30 DIAGNOSIS — I700 Atherosclerosis of aorta: Secondary | ICD-10-CM | POA: Diagnosis not present

## 2017-12-06 DIAGNOSIS — E291 Testicular hypofunction: Secondary | ICD-10-CM | POA: Diagnosis not present

## 2017-12-17 DIAGNOSIS — R9431 Abnormal electrocardiogram [ECG] [EKG]: Secondary | ICD-10-CM | POA: Diagnosis not present

## 2017-12-17 DIAGNOSIS — F17210 Nicotine dependence, cigarettes, uncomplicated: Secondary | ICD-10-CM | POA: Diagnosis not present

## 2017-12-17 DIAGNOSIS — I517 Cardiomegaly: Secondary | ICD-10-CM | POA: Diagnosis not present

## 2017-12-17 DIAGNOSIS — R55 Syncope and collapse: Secondary | ICD-10-CM | POA: Diagnosis not present

## 2017-12-17 DIAGNOSIS — Z5181 Encounter for therapeutic drug level monitoring: Secondary | ICD-10-CM | POA: Diagnosis not present

## 2017-12-31 DIAGNOSIS — M545 Low back pain: Secondary | ICD-10-CM | POA: Diagnosis not present

## 2017-12-31 DIAGNOSIS — M4310 Spondylolisthesis, site unspecified: Secondary | ICD-10-CM | POA: Diagnosis not present

## 2017-12-31 DIAGNOSIS — G8929 Other chronic pain: Secondary | ICD-10-CM | POA: Diagnosis not present

## 2017-12-31 DIAGNOSIS — G039 Meningitis, unspecified: Secondary | ICD-10-CM | POA: Diagnosis not present

## 2018-01-01 ENCOUNTER — Other Ambulatory Visit: Payer: Self-pay | Admitting: Internal Medicine

## 2018-01-31 ENCOUNTER — Other Ambulatory Visit: Payer: Self-pay

## 2018-01-31 ENCOUNTER — Encounter (HOSPITAL_COMMUNITY): Payer: Self-pay | Admitting: Emergency Medicine

## 2018-01-31 DIAGNOSIS — F1290 Cannabis use, unspecified, uncomplicated: Secondary | ICD-10-CM | POA: Diagnosis present

## 2018-01-31 DIAGNOSIS — R1084 Generalized abdominal pain: Secondary | ICD-10-CM | POA: Diagnosis not present

## 2018-01-31 DIAGNOSIS — I10 Essential (primary) hypertension: Secondary | ICD-10-CM | POA: Diagnosis present

## 2018-01-31 DIAGNOSIS — R52 Pain, unspecified: Secondary | ICD-10-CM | POA: Diagnosis not present

## 2018-01-31 DIAGNOSIS — M5136 Other intervertebral disc degeneration, lumbar region: Secondary | ICD-10-CM | POA: Diagnosis present

## 2018-01-31 DIAGNOSIS — Z882 Allergy status to sulfonamides status: Secondary | ICD-10-CM

## 2018-01-31 DIAGNOSIS — R740 Nonspecific elevation of levels of transaminase and lactic acid dehydrogenase [LDH]: Secondary | ICD-10-CM | POA: Diagnosis present

## 2018-01-31 DIAGNOSIS — Z86718 Personal history of other venous thrombosis and embolism: Secondary | ICD-10-CM

## 2018-01-31 DIAGNOSIS — D649 Anemia, unspecified: Secondary | ICD-10-CM | POA: Diagnosis present

## 2018-01-31 DIAGNOSIS — Z79899 Other long term (current) drug therapy: Secondary | ICD-10-CM

## 2018-01-31 DIAGNOSIS — Z880 Allergy status to penicillin: Secondary | ICD-10-CM

## 2018-01-31 DIAGNOSIS — Z8711 Personal history of peptic ulcer disease: Secondary | ICD-10-CM

## 2018-01-31 DIAGNOSIS — Z888 Allergy status to other drugs, medicaments and biological substances status: Secondary | ICD-10-CM

## 2018-01-31 DIAGNOSIS — K219 Gastro-esophageal reflux disease without esophagitis: Secondary | ICD-10-CM | POA: Diagnosis present

## 2018-01-31 DIAGNOSIS — Z79890 Hormone replacement therapy (postmenopausal): Secondary | ICD-10-CM

## 2018-01-31 DIAGNOSIS — K8066 Calculus of gallbladder and bile duct with acute and chronic cholecystitis without obstruction: Principal | ICD-10-CM | POA: Diagnosis present

## 2018-01-31 DIAGNOSIS — E1151 Type 2 diabetes mellitus with diabetic peripheral angiopathy without gangrene: Secondary | ICD-10-CM | POA: Diagnosis present

## 2018-01-31 DIAGNOSIS — F17210 Nicotine dependence, cigarettes, uncomplicated: Secondary | ICD-10-CM | POA: Diagnosis present

## 2018-01-31 DIAGNOSIS — Z885 Allergy status to narcotic agent status: Secondary | ICD-10-CM

## 2018-01-31 DIAGNOSIS — R935 Abnormal findings on diagnostic imaging of other abdominal regions, including retroperitoneum: Secondary | ICD-10-CM | POA: Diagnosis not present

## 2018-01-31 DIAGNOSIS — M5134 Other intervertebral disc degeneration, thoracic region: Secondary | ICD-10-CM | POA: Diagnosis present

## 2018-01-31 DIAGNOSIS — Z79891 Long term (current) use of opiate analgesic: Secondary | ICD-10-CM

## 2018-01-31 DIAGNOSIS — K828 Other specified diseases of gallbladder: Secondary | ICD-10-CM | POA: Diagnosis not present

## 2018-01-31 DIAGNOSIS — R109 Unspecified abdominal pain: Secondary | ICD-10-CM | POA: Diagnosis not present

## 2018-01-31 DIAGNOSIS — G8929 Other chronic pain: Secondary | ICD-10-CM | POA: Diagnosis present

## 2018-01-31 DIAGNOSIS — R945 Abnormal results of liver function studies: Secondary | ICD-10-CM | POA: Diagnosis not present

## 2018-01-31 DIAGNOSIS — K819 Cholecystitis, unspecified: Secondary | ICD-10-CM | POA: Diagnosis not present

## 2018-01-31 DIAGNOSIS — E039 Hypothyroidism, unspecified: Secondary | ICD-10-CM | POA: Diagnosis present

## 2018-01-31 DIAGNOSIS — R1013 Epigastric pain: Secondary | ICD-10-CM | POA: Diagnosis not present

## 2018-01-31 DIAGNOSIS — E119 Type 2 diabetes mellitus without complications: Secondary | ICD-10-CM | POA: Diagnosis present

## 2018-01-31 DIAGNOSIS — R932 Abnormal findings on diagnostic imaging of liver and biliary tract: Secondary | ICD-10-CM | POA: Diagnosis not present

## 2018-01-31 DIAGNOSIS — M5030 Other cervical disc degeneration, unspecified cervical region: Secondary | ICD-10-CM | POA: Diagnosis present

## 2018-01-31 NOTE — ED Triage Notes (Signed)
Pt brought to ED from home by [facility] with c/o abd pain x 3 hrs admits to nausea and dry heaves but denies emesis or diarrhea VS BP 196/105 hr 72 resp 18 Sat 99 RA

## 2018-02-01 ENCOUNTER — Emergency Department (HOSPITAL_COMMUNITY): Payer: Medicare Other

## 2018-02-01 ENCOUNTER — Inpatient Hospital Stay (HOSPITAL_COMMUNITY)
Admission: EM | Admit: 2018-02-01 | Discharge: 2018-02-06 | DRG: 419 | Disposition: A | Discharge status: Home / Self Care | Payer: Medicare Other | Attending: Surgery | Admitting: Surgery

## 2018-02-01 ENCOUNTER — Other Ambulatory Visit: Payer: Self-pay

## 2018-02-01 DIAGNOSIS — R932 Abnormal findings on diagnostic imaging of liver and biliary tract: Secondary | ICD-10-CM | POA: Diagnosis not present

## 2018-02-01 DIAGNOSIS — K8063 Calculus of gallbladder and bile duct with acute cholecystitis with obstruction: Secondary | ICD-10-CM | POA: Diagnosis not present

## 2018-02-01 DIAGNOSIS — Z79899 Other long term (current) drug therapy: Secondary | ICD-10-CM | POA: Diagnosis not present

## 2018-02-01 DIAGNOSIS — M5136 Other intervertebral disc degeneration, lumbar region: Secondary | ICD-10-CM | POA: Diagnosis present

## 2018-02-01 DIAGNOSIS — R7989 Other specified abnormal findings of blood chemistry: Secondary | ICD-10-CM

## 2018-02-01 DIAGNOSIS — Z79891 Long term (current) use of opiate analgesic: Secondary | ICD-10-CM | POA: Diagnosis not present

## 2018-02-01 DIAGNOSIS — Z8711 Personal history of peptic ulcer disease: Secondary | ICD-10-CM | POA: Diagnosis not present

## 2018-02-01 DIAGNOSIS — K838 Other specified diseases of biliary tract: Secondary | ICD-10-CM | POA: Diagnosis not present

## 2018-02-01 DIAGNOSIS — K819 Cholecystitis, unspecified: Secondary | ICD-10-CM

## 2018-02-01 DIAGNOSIS — R1084 Generalized abdominal pain: Secondary | ICD-10-CM | POA: Diagnosis not present

## 2018-02-01 DIAGNOSIS — Z86718 Personal history of other venous thrombosis and embolism: Secondary | ICD-10-CM | POA: Diagnosis not present

## 2018-02-01 DIAGNOSIS — K8010 Calculus of gallbladder with chronic cholecystitis without obstruction: Secondary | ICD-10-CM | POA: Diagnosis not present

## 2018-02-01 DIAGNOSIS — D649 Anemia, unspecified: Secondary | ICD-10-CM | POA: Diagnosis not present

## 2018-02-01 DIAGNOSIS — R935 Abnormal findings on diagnostic imaging of other abdominal regions, including retroperitoneum: Secondary | ICD-10-CM

## 2018-02-01 DIAGNOSIS — Z880 Allergy status to penicillin: Secondary | ICD-10-CM | POA: Diagnosis not present

## 2018-02-01 DIAGNOSIS — M5134 Other intervertebral disc degeneration, thoracic region: Secondary | ICD-10-CM | POA: Diagnosis present

## 2018-02-01 DIAGNOSIS — Z79890 Hormone replacement therapy (postmenopausal): Secondary | ICD-10-CM | POA: Diagnosis not present

## 2018-02-01 DIAGNOSIS — K8066 Calculus of gallbladder and bile duct with acute and chronic cholecystitis without obstruction: Secondary | ICD-10-CM | POA: Diagnosis present

## 2018-02-01 DIAGNOSIS — K8050 Calculus of bile duct without cholangitis or cholecystitis without obstruction: Secondary | ICD-10-CM

## 2018-02-01 DIAGNOSIS — I10 Essential (primary) hypertension: Secondary | ICD-10-CM | POA: Diagnosis not present

## 2018-02-01 DIAGNOSIS — R0602 Shortness of breath: Secondary | ICD-10-CM | POA: Diagnosis not present

## 2018-02-01 DIAGNOSIS — R7401 Elevation of levels of liver transaminase levels: Secondary | ICD-10-CM

## 2018-02-01 DIAGNOSIS — F1290 Cannabis use, unspecified, uncomplicated: Secondary | ICD-10-CM | POA: Diagnosis present

## 2018-02-01 DIAGNOSIS — K219 Gastro-esophageal reflux disease without esophagitis: Secondary | ICD-10-CM | POA: Diagnosis not present

## 2018-02-01 DIAGNOSIS — K828 Other specified diseases of gallbladder: Secondary | ICD-10-CM | POA: Diagnosis not present

## 2018-02-01 DIAGNOSIS — Z882 Allergy status to sulfonamides status: Secondary | ICD-10-CM | POA: Diagnosis not present

## 2018-02-01 DIAGNOSIS — E1151 Type 2 diabetes mellitus with diabetic peripheral angiopathy without gangrene: Secondary | ICD-10-CM | POA: Diagnosis present

## 2018-02-01 DIAGNOSIS — R740 Nonspecific elevation of levels of transaminase and lactic acid dehydrogenase [LDH]: Secondary | ICD-10-CM

## 2018-02-01 DIAGNOSIS — K8020 Calculus of gallbladder without cholecystitis without obstruction: Secondary | ICD-10-CM | POA: Diagnosis not present

## 2018-02-01 DIAGNOSIS — K9189 Other postprocedural complications and disorders of digestive system: Secondary | ICD-10-CM

## 2018-02-01 DIAGNOSIS — K811 Chronic cholecystitis: Secondary | ICD-10-CM | POA: Diagnosis not present

## 2018-02-01 DIAGNOSIS — E119 Type 2 diabetes mellitus without complications: Secondary | ICD-10-CM | POA: Diagnosis present

## 2018-02-01 DIAGNOSIS — R945 Abnormal results of liver function studies: Secondary | ICD-10-CM

## 2018-02-01 DIAGNOSIS — Z885 Allergy status to narcotic agent status: Secondary | ICD-10-CM | POA: Diagnosis not present

## 2018-02-01 DIAGNOSIS — R1013 Epigastric pain: Secondary | ICD-10-CM | POA: Diagnosis not present

## 2018-02-01 DIAGNOSIS — F17210 Nicotine dependence, cigarettes, uncomplicated: Secondary | ICD-10-CM | POA: Diagnosis present

## 2018-02-01 DIAGNOSIS — Z888 Allergy status to other drugs, medicaments and biological substances status: Secondary | ICD-10-CM | POA: Diagnosis not present

## 2018-02-01 DIAGNOSIS — R109 Unspecified abdominal pain: Secondary | ICD-10-CM

## 2018-02-01 DIAGNOSIS — G8929 Other chronic pain: Secondary | ICD-10-CM | POA: Diagnosis present

## 2018-02-01 DIAGNOSIS — R748 Abnormal levels of other serum enzymes: Secondary | ICD-10-CM | POA: Diagnosis not present

## 2018-02-01 DIAGNOSIS — Z9889 Other specified postprocedural states: Secondary | ICD-10-CM

## 2018-02-01 DIAGNOSIS — M5030 Other cervical disc degeneration, unspecified cervical region: Secondary | ICD-10-CM | POA: Diagnosis present

## 2018-02-01 DIAGNOSIS — E039 Hypothyroidism, unspecified: Secondary | ICD-10-CM | POA: Diagnosis present

## 2018-02-01 LAB — CBC WITH DIFFERENTIAL/PLATELET
Basophils Absolute: 0 10*3/uL (ref 0.0–0.1)
Basophils Relative: 0 %
Eosinophils Absolute: 0 10*3/uL (ref 0.0–0.7)
Eosinophils Relative: 0 %
HEMATOCRIT: 42.2 % (ref 39.0–52.0)
Hemoglobin: 13.9 g/dL (ref 13.0–17.0)
LYMPHS ABS: 0.7 10*3/uL (ref 0.7–4.0)
LYMPHS PCT: 7 %
MCH: 28.5 pg (ref 26.0–34.0)
MCHC: 32.9 g/dL (ref 30.0–36.0)
MCV: 86.5 fL (ref 78.0–100.0)
Monocytes Absolute: 0.4 10*3/uL (ref 0.1–1.0)
Monocytes Relative: 4 %
NEUTROS PCT: 89 %
Neutro Abs: 8.2 10*3/uL — ABNORMAL HIGH (ref 1.7–7.7)
Platelets: 380 10*3/uL (ref 150–400)
RBC: 4.88 MIL/uL (ref 4.22–5.81)
RDW: 15.2 % (ref 11.5–15.5)
WBC: 9.3 10*3/uL (ref 4.0–10.5)

## 2018-02-01 LAB — COMPREHENSIVE METABOLIC PANEL
ALT: 710 U/L — ABNORMAL HIGH (ref 0–44)
ANION GAP: 14 (ref 5–15)
AST: 1460 U/L — ABNORMAL HIGH (ref 15–41)
Albumin: 4.1 g/dL (ref 3.5–5.0)
Alkaline Phosphatase: 225 U/L — ABNORMAL HIGH (ref 38–126)
BILIRUBIN TOTAL: 1.5 mg/dL — AB (ref 0.3–1.2)
BUN: 19 mg/dL (ref 8–23)
CALCIUM: 9.8 mg/dL (ref 8.9–10.3)
CO2: 23 mmol/L (ref 22–32)
Chloride: 107 mmol/L (ref 98–111)
Creatinine, Ser: 0.98 mg/dL (ref 0.61–1.24)
Glucose, Bld: 160 mg/dL — ABNORMAL HIGH (ref 70–99)
Potassium: 4.1 mmol/L (ref 3.5–5.1)
SODIUM: 144 mmol/L (ref 135–145)
TOTAL PROTEIN: 7.8 g/dL (ref 6.5–8.1)

## 2018-02-01 LAB — URINALYSIS, ROUTINE W REFLEX MICROSCOPIC
Bacteria, UA: NONE SEEN
Bilirubin Urine: NEGATIVE
GLUCOSE, UA: NEGATIVE mg/dL
Ketones, ur: NEGATIVE mg/dL
Leukocytes, UA: NEGATIVE
NITRITE: NEGATIVE
PH: 6 (ref 5.0–8.0)
Protein, ur: 100 mg/dL — AB
SPECIFIC GRAVITY, URINE: 1.016 (ref 1.005–1.030)

## 2018-02-01 LAB — RAPID URINE DRUG SCREEN, HOSP PERFORMED
AMPHETAMINES: NOT DETECTED
Barbiturates: NOT DETECTED
Benzodiazepines: NOT DETECTED
Cocaine: NOT DETECTED
Opiates: NOT DETECTED
TETRAHYDROCANNABINOL: POSITIVE — AB

## 2018-02-01 LAB — ACETAMINOPHEN LEVEL: Acetaminophen (Tylenol), Serum: <10 ug/mL — ABNORMAL LOW (ref 10–30)

## 2018-02-01 LAB — PROTIME-INR
INR: 1.01
PROTHROMBIN TIME: 13.2 s (ref 11.4–15.2)

## 2018-02-01 LAB — ETHANOL

## 2018-02-01 LAB — SALICYLATE LEVEL

## 2018-02-01 LAB — CBG MONITORING, ED: GLUCOSE-CAPILLARY: 170 mg/dL — AB (ref 70–99)

## 2018-02-01 LAB — LIPASE, BLOOD: LIPASE: 44 U/L (ref 11–51)

## 2018-02-01 MED ORDER — TEMAZEPAM 7.5 MG PO CAPS
7.5000 mg | ORAL_CAPSULE | Freq: Every evening | ORAL | Status: DC | PRN
  Administered 2018-02-01 – 2018-02-02 (×2): 7.5 mg via ORAL
  Filled 2018-02-01 (×2): qty 1

## 2018-02-01 MED ORDER — GADOBENATE DIMEGLUMINE 529 MG/ML IV SOLN
15.0000 mL | Freq: Once | INTRAVENOUS | Status: AC | PRN
  Administered 2018-02-01: 15 mL via INTRAVENOUS

## 2018-02-01 MED ORDER — ALBUTEROL SULFATE (2.5 MG/3ML) 0.083% IN NEBU: 2.5000 mg | INHALATION_SOLUTION | Freq: Four times a day (QID) | RESPIRATORY_TRACT | Status: DC | PRN

## 2018-02-01 MED ORDER — ONDANSETRON 4 MG PO TBDP: 4.0000 mg | ORAL_TABLET | Freq: Four times a day (QID) | ORAL | Status: DC | PRN

## 2018-02-01 MED ORDER — KCL IN DEXTROSE-NACL 20-5-0.9 MEQ/L-%-% IV SOLN
INTRAVENOUS | Status: DC
  Administered 2018-02-01 – 2018-02-03 (×3): via INTRAVENOUS
  Filled 2018-02-01 (×5): qty 1000

## 2018-02-01 MED ORDER — LOSARTAN POTASSIUM 50 MG PO TABS
50.0000 mg | ORAL_TABLET | Freq: Every day | ORAL | Status: DC
  Administered 2018-02-02: 50 mg via ORAL
  Filled 2018-02-01: qty 1

## 2018-02-01 MED ORDER — METHOCARBAMOL 500 MG PO TABS
500.0000 mg | ORAL_TABLET | Freq: Four times a day (QID) | ORAL | Status: DC | PRN
  Administered 2018-02-01 – 2018-02-03 (×4): 500 mg via ORAL
  Filled 2018-02-01 (×4): qty 1

## 2018-02-01 MED ORDER — ONDANSETRON HCL 4 MG/2ML IJ SOLN
4.0000 mg | Freq: Once | INTRAMUSCULAR | Status: AC
  Administered 2018-02-01: 4 mg via INTRAVENOUS
  Filled 2018-02-01: qty 2

## 2018-02-01 MED ORDER — LEVOTHYROXINE SODIUM 75 MCG PO TABS
75.0000 ug | ORAL_TABLET | Freq: Every day | ORAL | Status: DC
Start: 2018-02-02 — End: 2018-02-03
  Administered 2018-02-02 – 2018-02-03 (×2): 75 ug via ORAL
  Filled 2018-02-01 (×2): qty 1

## 2018-02-01 MED ORDER — PREGABALIN 25 MG PO CAPS
25.0000 mg | ORAL_CAPSULE | Freq: Two times a day (BID) | ORAL | Status: DC
  Administered 2018-02-01 – 2018-02-02 (×4): 25 mg via ORAL
  Filled 2018-02-01 (×4): qty 1

## 2018-02-01 MED ORDER — ONDANSETRON HCL 4 MG/2ML IJ SOLN: 4.0000 mg | Freq: Four times a day (QID) | INTRAMUSCULAR | Status: DC | PRN

## 2018-02-01 MED ORDER — HYDROMORPHONE HCL 1 MG/ML IJ SOLN
1.0000 mg | Freq: Once | INTRAMUSCULAR | Status: AC
  Administered 2018-02-01: 1 mg via INTRAVENOUS
  Filled 2018-02-01: qty 1

## 2018-02-01 MED ORDER — HEPARIN SODIUM (PORCINE) 5000 UNIT/ML IJ SOLN
5000.0000 [IU] | Freq: Three times a day (TID) | INTRAMUSCULAR | Status: DC
  Administered 2018-02-01 – 2018-02-03 (×6): 5000 [IU] via SUBCUTANEOUS
  Filled 2018-02-01 (×6): qty 1

## 2018-02-01 MED ORDER — FAMOTIDINE IN NACL 20-0.9 MG/50ML-% IV SOLN
20.0000 mg | Freq: Two times a day (BID) | INTRAVENOUS | Status: AC
  Administered 2018-02-01 – 2018-02-02 (×4): 20 mg via INTRAVENOUS
  Filled 2018-02-01 (×4): qty 50

## 2018-02-01 MED ORDER — HYDROMORPHONE HCL 1 MG/ML IJ SOLN
0.5000 mg | INTRAMUSCULAR | Status: DC | PRN
  Administered 2018-02-01 – 2018-02-03 (×10): 1 mg via INTRAVENOUS
  Filled 2018-02-01 (×10): qty 1

## 2018-02-01 NOTE — ED Notes (Signed)
Patient transported to MRI 

## 2018-02-01 NOTE — ED Notes (Signed)
ED TO INPATIENT HANDOFF REPORT  Name/Age/Gender Austin Barnes 67 y.o. male  Code Status   Home/SNF/Other Home  Chief Complaint Abdominal Pain  Level of Care/Admitting Diagnosis ED Disposition    ED Disposition Condition [facility] Area: [place] [[id number]]  Level of Care: Med-Surg [16]  Diagnosis: Cholecystitis with cholelithiasis [056979]  Admitting Physician: Jovita Kussmaul [phone]  Attending Physician: CCS, MD [[id number]]  Estimated length of stay: 3 - 4 days  Certification:: I certify this patient will need inpatient services for at least 2 midnights  PT Class (Do Not Modify): Inpatient [101]  PT Acc Code (Do Not Modify): Private [1]       Medical History Past Medical History:  Diagnosis Date  . 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 Anemia   . Arthritis   . Chronic pain   . 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 DDD (degenerative disc disease), cervical    Patient reported   . DDD (degenerative disc disease), lumbar    Patient reported   . DDD (degenerative disc disease), thoracic    Patient Reported   . DVT (deep venous thrombosis) ([place])   . Heart murmur   . Hiatal hernia   . Hypothyroidism 12/14/2014  . Impotence   . Other and unspecified hyperlipidemia   . Pancreatitis, chronic ([place]) 12/14/2014  . Peripheral vascular disease ([place])   . Pneumonia    double pneumonia in [place]  . Reflux   . Ruptured disc, cervical    Patient reported   . Ruptured disc, thoracic    Patient reported   . Spinal stenosis, thoracic    Patient reported   . Stenosis of lumbosacral spine    Patient reported   . Stenosis, cervical spine    Patient reported   . Testosterone insufficiency 2013  . Ulcer    Peptic disease  . Unspecified essential hypertension     Allergies Allergies  Allergen Reactions  . Disalcid [Salsalate] Other (See Comments)    Patient has ulcers; can't take  . Feldene  [Piroxicam] Other (See Comments)    Reaction not recalled by the patient  . Hctz [Hydrochlorothiazide] Other (See Comments)    Possibly caused pancreatitis (??)  . Other Nausea And Vomiting    Green peppers  . Penicillins Other (See Comments)    Severe welts Has patient had a PCN reaction causing immediate rash, facial/tongue/throat swelling, SOB or lightheadedness with hypotension: Yes Has patient had a PCN reaction causing severe rash involving mucus membranes or skin necrosis: No Has patient had a PCN reaction that required hospitalization: No Has patient had a PCN reaction occurring within the last 10 years: No If all of the above answers are "NO", then may proceed with Cephalosporin use.   . Codeine Rash  . Demerol Hives and Rash  . Sulfa Antibiotics Rash    IV Location/Drains/Wounds Patient Lines/Drains/Airways Status   Active Line/Drains/Airways    None          Labs/Imaging Results for orders placed or performed during the hospital encounter of 02/01/18 (from the past 48 hour(s))  CBC with Differential/Platelet     Status: Abnormal   Collection Time: 01/31/18  1:00 AM  Result Value Ref Range   WBC 9.3 4.0 - 10.5 K/uL   RBC 4.88 4.22 - 5.81 MIL/uL   Hemoglobin 13.9 13.0 - 17.0 g/dL   HCT 42.2 39.0 - 52.0 %   MCV 86.5 78.0 -  100.0 fL   MCH 28.5 26.0 - 34.0 pg   MCHC 32.9 30.0 - 36.0 g/dL   RDW 15.2 11.5 - 15.5 %   Platelets 380 150 - 400 K/uL   Neutrophils Relative % 89 %   Neutro Abs 8.2 (H) 1.7 - 7.7 K/uL   Lymphocytes Relative 7 %   Lymphs Abs 0.7 0.7 - 4.0 K/uL   Monocytes Relative 4 %   Monocytes Absolute 0.4 0.1 - 1.0 K/uL   Eosinophils Relative 0 %   Eosinophils Absolute 0.0 0.0 - 0.7 K/uL   Basophils Relative 0 %   Basophils Absolute 0.0 0.0 - 0.1 K/uL    Comment: Performed at [facility], [place] [address] [place], [place] [address]  Comprehensive metabolic panel     Status: Abnormal   Collection Time: 01/31/18  1:00 AM  Result  Value Ref Range   Sodium 144 135 - 145 mmol/L   Potassium 4.1 3.5 - 5.1 mmol/L   Chloride 107 98 - 111 mmol/L    Comment: Please note change in reference range.   CO2 23 22 - 32 mmol/L   Glucose, Bld 160 (H) 70 - 99 mg/dL    Comment: Please note change in reference range.   BUN 19 8 - 23 mg/dL    Comment: Please note change in reference range.   Creatinine, Ser 0.98 0.61 - 1.24 mg/dL   Calcium 9.8 8.9 - 10.3 mg/dL   Total Protein 7.8 6.5 - 8.1 g/dL   Albumin 4.1 3.5 - 5.0 g/dL   AST 1,460 (H) 15 - 41 U/L   ALT 710 (H) 0 - 44 U/L    Comment: Please note change in reference range.   Alkaline Phosphatase 225 (H) 38 - 126 U/L   Total Bilirubin 1.5 (H) 0.3 - 1.2 mg/dL   GFR calc non Af Amer >60 >60 mL/min   GFR calc Af Amer >60 >60 mL/min    Comment: (NOTE) The eGFR has been calculated using the CKD EPI equation. This calculation has not been validated in all clinical situations. eGFR's persistently <60 mL/min signify possible Chronic Kidney Disease.    Anion gap 14 5 - 15    Comment: Performed at [facility], [place] [address], [place], [place] [address]  Lipase, blood     Status: None   Collection Time: 01/31/18  1:00 AM  Result Value Ref Range   Lipase 44 11 - 51 U/L    Comment: Performed at [facility], [place] [address] [place], [place] [address]  CBG monitoring, ED     Status: Abnormal   Collection Time: 02/01/18 12:46 AM  Result Value Ref Range   Glucose-Capillary 170 (H) 70 - 99 mg/dL  Urinalysis, Routine w reflex microscopic     Status: Abnormal   Collection Time: 02/01/18  1:59 AM  Result Value Ref Range   Color, Urine YELLOW YELLOW   APPearance CLEAR CLEAR   Specific Gravity, Urine 1.016 1.005 - 1.030   pH 6.0 5.0 - 8.0   Glucose, UA NEGATIVE NEGATIVE mg/dL   Hgb urine dipstick SMALL (A) NEGATIVE   Bilirubin Urine NEGATIVE NEGATIVE   Ketones, ur NEGATIVE NEGATIVE mg/dL   Protein, ur 100 (A) NEGATIVE mg/dL   Nitrite NEGATIVE  NEGATIVE   Leukocytes, UA NEGATIVE NEGATIVE   RBC / HPF 6-10 0 - 5 RBC/hpf   WBC, UA 0-5 0 - 5 WBC/hpf   Bacteria, UA NONE SEEN NONE SEEN  Comment: Performed at [facility], [place] [address] [place], [place] [address]  Ethanol     Status: None   Collection Time: 02/01/18  4:00 AM  Result Value Ref Range   Alcohol, Ethyl (B) <10 <10 mg/dL    Comment: (NOTE) Lowest detectable limit for serum alcohol is 10 mg/dL. For medical purposes only. Performed at [facility], [place] [address], [place], [place] [address]   Acetaminophen level     Status: Abnormal   Collection Time: 02/01/18  4:00 AM  Result Value Ref Range   Acetaminophen (Tylenol), Serum <10 (L) 10 - 30 ug/mL    Comment: (NOTE) Therapeutic concentrations vary significantly. A range of 10-30 ug/mL  may be an effective concentration for many patients. However, some  are best treated at concentrations outside of this range. Acetaminophen concentrations >150 ug/mL at 4 hours after ingestion  and >50 ug/mL at 12 hours after ingestion are often associated with  toxic reactions. Performed at [facility], [place] [address], [place], [place] [address]   Salicylate level     Status: None   Collection Time: 02/01/18  4:00 AM  Result Value Ref Range   Salicylate Lvl <9.0 2.8 - 30.0 mg/dL    Comment: Performed at [facility], [place] [address] [place], [place] [address]  Protime-INR     Status: None   Collection Time: 02/01/18  4:00 AM  Result Value Ref Range   Prothrombin Time 13.2 11.4 - 15.2 seconds   INR 1.01     Comment: Performed at [facility], [place] [address] [place],  [address]   US Abdomen Complete  Result Date: 02/01/2018 CLINICAL DATA:  67 year old male with abdominal pain. Elevated liver enzymes. EXAM: ABDOMEN ULTRASOUND COMPLETE COMPARISON:  CT of the abdomen pelvis dated 07/21/2017 FINDINGS: Gallbladder: There is small  amount of sludge within the gallbladder. The gallbladder wall is thickened and edematous measuring 7 mm. Trace pericholecystic fluid may be present. Although findings may be related to underlying systemic etiology or secondary to liver disease, acute cholecystitis is not excluded. Clinical correlation is recommended. Common bile duct: Diameter: 10 mm Liver: The liver is unremarkable as visualized. Portal vein is patent on color Doppler imaging with normal direction of blood flow towards the liver. IVC: No abnormality visualized. Pancreas: The pancreas is poorly visualized and obscured by bowel gas. Spleen: Size and appearance within normal limits. Right Kidney: Length: 10 cm. Slightly lobulated renal cortex likely related to areas of scarring. No hydronephrosis or shadowing stone. Left Kidney: Length: 11.4 cm. Lobulated renal cortex with areas of scarring. No hydronephrosis or shadowing stone. Abdominal aorta: Suboptimally evaluated due to bowel gas. The aorta measures up to 2.8 cm in diameter. Other findings: None. IMPRESSION: Gallbladder sludge. Thickened and edematous gallbladder wall without sonographic Murphy's sign. Findings may be related to underlying systemic or liver disease although acute cholecystitis is not entirely excluded. A hepatobiliary scintigraphy may provide better evaluation of the gallbladder if there is a high clinical concern for acute cholecystitis . Electronically Signed   By: Anner Crete M.D.   On: 02/01/2018 05:05   Mr 3d Recon At Scanner  Result Date: 02/01/2018 CLINICAL DATA:  Abnormal liver function tests. Thickened gallbladder wall with sludge. Elevated LFTs. EXAM: MRI ABDOMEN WITHOUT AND WITH CONTRAST (INCLUDING MRCP) TECHNIQUE: Multiplanar multisequence MR imaging of the abdomen was performed both before and after the administration of intravenous contrast. Heavily T2-weighted images of the biliary and pancreatic  ducts were obtained, and three-dimensional MRCP images were  rendered by post processing. CONTRAST:  77m MULTIHANCE GADOBENATE DIMEGLUMINE 529 MG/ML IV SOLN COMPARISON:  Ultrasound 02/01/2018, CT 07/21/2017 FINDINGS: Lower chest:  Lung bases are clear. Hepatobiliary: There is mild gallbladder wall thickening 4 mm. No gallstones identified. The common bile duct common hepatic duct are prominent measure up to 8 mm which is mildly dilated but this is similar to comparison CT 07/21/2017. There is a 2-3 mm filling defect in the distal common bile duct seen on several series (image 36/8, image 35/3.) This may represent small distal common bile duct stone however the is not conclusive and the exam is limited by respiratory motion. No intrahepatic duct dilatation. Pancreas: No pancreatic inflammation. Pancreatic duct is normal caliber. No peripancreatic fluid collections. The T1 weighted images are degraded by respiratory motion Spleen: Normal spleen. Adrenals/urinary tract: Adrenal glands and kidneys are normal. Stomach/Bowel: Stomach and limited of the small bowel is unremarkable Vascular/Lymphatic: Abdominal aortic normal caliber. No retroperitoneal periportal lymphadenopathy. Musculoskeletal: No aggressive osseous lesion IMPRESSION: 1. Mild gallbladder wall thickening small amount pericholecystic fluid. No gallstones identified. 2. Mild common hepatic duct and common bile duct dilatation which is similar to comparison CT. Small 2-3 mm filling defect within the distal common bile duct does not appear occlusive but could represent small distal stone. Exam is limited by respiratory motion. 3. Small of fluid surrounds the liver. 4. No evidence pancreatitis. Electronically Signed   By: SSuzy BouchardM.D.   On: 02/01/2018 12:24   Dg Abdomen Acute W/chest  Result Date: 02/01/2018 CLINICAL DATA:  Abdominal pain x3 hours with nausea and dry heaves. Current smoker. EXAM: DG ABDOMEN ACUTE W/ 1V CHEST CXR 11/13/2017, CT abdomen and pelvis 07/21/2017 FINDINGS: Top-normal size heart  with uncoiled appearance of the thoracic aorta. No acute pulmonary consolidation or CHF. No effusion or pneumothorax. There is no free air beneath the diaphragm. Surgical clips project over the cardiac silhouette which on prior comparison studies localize over the dorsum of the mid and lower thoracic spine. Supine and right side up decubitus views of the abdomen demonstrate no free air. Moderate stool burden within the transverse and descending colon with scattered air containing small and large bowel loops in a nonobstructed, nondistended bowel gas pattern. Lumbar degenerative disc disease is noted from L3 through S1 with interbody fusion at L4-5. IMPRESSION: No active pulmonary disease. Increased colonic stool burden within the transverse and descending colon. No bowel obstruction is seen. Electronically Signed   By: DAshley RoyaltyM.D.   On: 02/01/2018 02:00   Mr Abdomen Mrcp WMoise BoringContast  Result Date: 02/01/2018 CLINICAL DATA:  Abnormal liver function tests. Thickened gallbladder wall with sludge. Elevated LFTs. EXAM: MRI ABDOMEN WITHOUT AND WITH CONTRAST (INCLUDING MRCP) TECHNIQUE: Multiplanar multisequence MR imaging of the abdomen was performed both before and after the administration of intravenous contrast. Heavily T2-weighted images of the biliary and pancreatic ducts were obtained, and three-dimensional MRCP images were rendered by post processing. CONTRAST:  1108mMULTIHANCE GADOBENATE DIMEGLUMINE 529 MG/ML IV SOLN COMPARISON:  Ultrasound 02/01/2018, CT 07/21/2017 FINDINGS: Lower chest:  Lung bases are clear. Hepatobiliary: There is mild gallbladder wall thickening 4 mm. No gallstones identified. The common bile duct common hepatic duct are prominent measure up to 8 mm which is mildly dilated but this is similar to comparison CT 07/21/2017. There is a 2-3 mm filling defect in the distal common bile duct seen on several series (image 36/8, image 35/3.) This may represent small  distal common bile duct  stone however the is not conclusive and the exam is limited by respiratory motion. No intrahepatic duct dilatation. Pancreas: No pancreatic inflammation. Pancreatic duct is normal caliber. No peripancreatic fluid collections. The T1 weighted images are degraded by respiratory motion Spleen: Normal spleen. Adrenals/urinary tract: Adrenal glands and kidneys are normal. Stomach/Bowel: Stomach and limited of the small bowel is unremarkable Vascular/Lymphatic: Abdominal aortic normal caliber. No retroperitoneal periportal lymphadenopathy. Musculoskeletal: No aggressive osseous lesion IMPRESSION: 1. Mild gallbladder wall thickening small amount pericholecystic fluid. No gallstones identified. 2. Mild common hepatic duct and common bile duct dilatation which is similar to comparison CT. Small 2-3 mm filling defect within the distal common bile duct does not appear occlusive but could represent small distal stone. Exam is limited by respiratory motion. 3. Small of fluid surrounds the liver. 4. No evidence pancreatitis. Electronically Signed   By: Suzy Bouchard M.D.   On: 02/01/2018 12:24    Pending Labs Unresulted Labs (From admission, onward)   Start     Ordered   02/02/18 0500  Comprehensive metabolic panel  Tomorrow morning,   R     02/01/18 0855   02/01/18 0222  Hepatitis panel, acute  STAT,   STAT     02/01/18 0221   01/31/18 2307  Rapid urine drug screen (hospital performed)  STAT,   R     01/31/18 2306   Signed and Held  HIV antibody (Routine Testing)  Once,   R     Signed and Held   Signed and Held  Comprehensive metabolic panel  Tomorrow morning,   R     Signed and Held   Signed and Held  CBC  Tomorrow morning,   R     Signed and Held      Vitals/Pain Today's Vitals   02/01/18 0938 02/01/18 1000 02/01/18 1030 02/01/18 1221  BP:  (!) 166/102 (!) 148/101 (!) 147/80  Pulse:  (!) 59 (!) 56   Resp:  15 13   Temp:      TempSrc:      SpO2:  100% 100%   PainSc: 5        Isolation  Precautions No active isolations  Medications Medications  ondansetron (ZOFRAN) injection 4 mg (4 mg Intravenous Given 02/01/18 0155)  HYDROmorphone (DILAUDID) injection 1 mg (1 mg Intravenous Given 02/01/18 0156)  gadobenate dimeglumine (MULTIHANCE) injection 15 mL (15 mLs Intravenous Contrast Given 02/01/18 1125)    Mobility walks

## 2018-02-01 NOTE — Progress Notes (Addendum)
Case discussed with Dr. Dulce Sellarutlaw, biliary endoscopist this weekend and also Dr. Ezzard StandingNewman. Dr. Dulce Sellarutlaw has concern about prior gastric ulcer surgery and ability to assess the ampulla with duodenoscope.   MRCP reviewed. Will defer decision to Dr. Carolynne Edouardoth and Dr. Ezzard StandingNewman regarding operative timing for cholecystectomy with IOC. Dr. Dulce Sellarutlaw is also available to discuss the case further  It appears an UGI series has been ordered to better understand the post-surgical anatomy of the upper GI tract.  Will await these results. I will see the patient again tomorrow.

## 2018-02-01 NOTE — ED Notes (Signed)
Pt has been informed that we need urine, pt given a urinal

## 2018-02-01 NOTE — H&P (Signed)
Austin Barnes is an 67 y.o. male.   Chief Complaint: abdominal pain HPI: The patient is a 67 year old white male who presents with abdominal pain for the last day or two. Pain he describes as severe. He has had similar pains for years off and on. Pain is associated with nausea and dry heaves. Denies fever. He has history of PUD with gastrojejunostomy and vagotomy. U/s shows gallstones and thickened gallbladder wall. LFT's are elevated  Past Medical History:  Diagnosis Date  . 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 Anemia   . Arthritis   . Chronic pain   . 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 DDD (degenerative disc disease), cervical    Patient reported   . DDD (degenerative disc disease), lumbar    Patient reported   . DDD (degenerative disc disease), thoracic    Patient Reported   . DVT (deep venous thrombosis) ([place])   . Heart murmur   . Hiatal hernia   . Hypothyroidism 12/14/2014  . Impotence   . Other and unspecified hyperlipidemia   . Pancreatitis, chronic ([place]) 12/14/2014  . Peripheral vascular disease ([place])   . Pneumonia    double pneumonia in [place]  . Reflux   . Ruptured disc, cervical    Patient reported   . Ruptured disc, thoracic    Patient reported   . Spinal stenosis, thoracic    Patient reported   . Stenosis of lumbosacral spine    Patient reported   . Stenosis, cervical spine    Patient reported   . Testosterone insufficiency 2013  . Ulcer    Peptic disease  . Unspecified essential hypertension     Past Surgical History:  Procedure Laterality Date  . BACK SURGERY     15 back surgeries, starting at age 67   . CARPAL TUNNEL RELEASE Right 1995  . GASTROJEJUNOSTOMY    . KIDNEY STONE SURGERY    . LEG SURGERY     right leg  . SPINE SURGERY    . TRUNCAL VAGOTOMY  1995    Family History  Problem Relation Age of Onset  . Hypertension Mother   . Stroke Father 43   Social History:  reports that he has been  smoking cigarettes.  He has a 0.75 pack-year smoking history. He quit smokeless tobacco use about 10 years ago. He reports that he does not drink alcohol or use drugs.  Allergies:  Allergies  Allergen Reactions  . Disalcid [Salsalate] Other (See Comments)    Patient has ulcers; can't take  . Feldene [Piroxicam] Other (See Comments)    Reaction not recalled by the patient  . Hctz [Hydrochlorothiazide] Other (See Comments)    Possibly caused pancreatitis (??)  . Other Nausea And Vomiting    Green peppers  . Penicillins Other (See Comments)    Severe welts Has patient had a PCN reaction causing immediate rash, facial/tongue/throat swelling, SOB or lightheadedness with hypotension: Yes Has patient had a PCN reaction causing severe rash involving mucus membranes or skin necrosis: No Has patient had a PCN reaction that required hospitalization: No Has patient had a PCN reaction occurring within the last 10 years: No If all of the above answers are "NO", then may proceed with Cephalosporin use.   . Codeine Rash  . Demerol Hives and Rash  . Sulfa Antibiotics Rash     (Not in a hospital admission)  Results for orders placed or performed during the hospital encounter  of 02/01/18 (from the past 48 hour(s))  CBC with Differential/Platelet     Status: Abnormal   Collection Time: 01/31/18  1:00 AM  Result Value Ref Range   WBC 9.3 4.0 - 10.5 K/uL   RBC 4.88 4.22 - 5.81 MIL/uL   Hemoglobin 13.9 13.0 - 17.0 g/dL   HCT 42.2 39.0 - 52.0 %   MCV 86.5 78.0 - 100.0 fL   MCH 28.5 26.0 - 34.0 pg   MCHC 32.9 30.0 - 36.0 g/dL   RDW 15.2 11.5 - 15.5 %   Platelets 380 150 - 400 K/uL   Neutrophils Relative % 89 %   Neutro Abs 8.2 (H) 1.7 - 7.7 K/uL   Lymphocytes Relative 7 %   Lymphs Abs 0.7 0.7 - 4.0 K/uL   Monocytes Relative 4 %   Monocytes Absolute 0.4 0.1 - 1.0 K/uL   Eosinophils Relative 0 %   Eosinophils Absolute 0.0 0.0 - 0.7 K/uL   Basophils Relative 0 %   Basophils Absolute 0.0 0.0 -  0.1 K/uL    Comment: Performed at [facility], [place] [address], [place], [place] [address]  Comprehensive metabolic panel     Status: Abnormal   Collection Time: 01/31/18  1:00 AM  Result Value Ref Range   Sodium 144 135 - 145 mmol/L   Potassium 4.1 3.5 - 5.1 mmol/L   Chloride 107 98 - 111 mmol/L    Comment: Please note change in reference range.   CO2 23 22 - 32 mmol/L   Glucose, Bld 160 (H) 70 - 99 mg/dL    Comment: Please note change in reference range.   BUN 19 8 - 23 mg/dL    Comment: Please note change in reference range.   Creatinine, Ser 0.98 0.61 - 1.24 mg/dL   Calcium 9.8 8.9 - 10.3 mg/dL   Total Protein 7.8 6.5 - 8.1 g/dL   Albumin 4.1 3.5 - 5.0 g/dL   AST 1,460 (H) 15 - 41 U/L   ALT 710 (H) 0 - 44 U/L    Comment: Please note change in reference range.   Alkaline Phosphatase 225 (H) 38 - 126 U/L   Total Bilirubin 1.5 (H) 0.3 - 1.2 mg/dL   GFR calc non Af Amer >60 >60 mL/min   GFR calc Af Amer >60 >60 mL/min    Comment: (NOTE) The eGFR has been calculated using the CKD EPI equation. This calculation has not been validated in all clinical situations. eGFR's persistently <60 mL/min signify possible Chronic Kidney Disease.    Anion gap 14 5 - 15    Comment: Performed at [facility], [place] [address], [place], [place] [address]  Lipase, blood     Status: None   Collection Time: 01/31/18  1:00 AM  Result Value Ref Range   Lipase 44 11 - 51 U/L    Comment: Performed at [facility], [place] [address], [place], [place] [address]  CBG monitoring, ED     Status: Abnormal   Collection Time: 02/01/18 12:46 AM  Result Value Ref Range   Glucose-Capillary 170 (H) 70 - 99 mg/dL  Urinalysis, Routine w reflex microscopic     Status: Abnormal   Collection Time: 02/01/18  1:59 AM  Result Value Ref Range   Color, Urine YELLOW YELLOW   APPearance CLEAR CLEAR   Specific Gravity, Urine 1.016 1.005 - 1.030   pH 6.0 5.0 - 8.0    Glucose, UA NEGATIVE NEGATIVE mg/dL  Hgb urine dipstick SMALL (A) NEGATIVE   Bilirubin Urine NEGATIVE NEGATIVE   Ketones, ur NEGATIVE NEGATIVE mg/dL   Protein, ur 100 (A) NEGATIVE mg/dL   Nitrite NEGATIVE NEGATIVE   Leukocytes, UA NEGATIVE NEGATIVE   RBC / HPF 6-10 0 - 5 RBC/hpf   WBC, UA 0-5 0 - 5 WBC/hpf   Bacteria, UA NONE SEEN NONE SEEN    Comment: Performed at [facility], [place] [address] [place], [place] [address]  Ethanol     Status: None   Collection Time: 02/01/18  4:00 AM  Result Value Ref Range   Alcohol, Ethyl (B) <10 <10 mg/dL    Comment: (NOTE) Lowest detectable limit for serum alcohol is 10 mg/dL. For medical purposes only. Performed at [facility], [place] [address], [place], [place] [address]   Acetaminophen level     Status: Abnormal   Collection Time: 02/01/18  4:00 AM  Result Value Ref Range   Acetaminophen (Tylenol), Serum <10 (L) 10 - 30 ug/mL    Comment: (NOTE) Therapeutic concentrations vary significantly. A range of 10-30 ug/mL  may be an effective concentration for many patients. However, some  are best treated at concentrations outside of this range. Acetaminophen concentrations >150 ug/mL at 4 hours after ingestion  and >50 ug/mL at 12 hours after ingestion are often associated with  toxic reactions. Performed at [facility], [place] [address] [place], [place] [address]   Salicylate level     Status: None   Collection Time: 02/01/18  4:00 AM  Result Value Ref Range   Salicylate Lvl <6.7 2.8 - 30.0 mg/dL    Comment: Performed at [facility], [place] [address] [place], [place] [address]  Protime-INR     Status: None   Collection Time: 02/01/18  4:00 AM  Result Value Ref Range   Prothrombin Time 13.2 11.4 - 15.2 seconds   INR 1.01     Comment: Performed at [facility], [place] [address], [place],  [address]   US Abdomen Complete  Result Date:  02/01/2018 CLINICAL DATA:  67 year old male with abdominal pain. Elevated liver enzymes. EXAM: ABDOMEN ULTRASOUND COMPLETE COMPARISON:  CT of the abdomen pelvis dated 07/21/2017 FINDINGS: Gallbladder: There is small amount of sludge within the gallbladder. The gallbladder wall is thickened and edematous measuring 7 mm. Trace pericholecystic fluid may be present. Although findings may be related to underlying systemic etiology or secondary to liver disease, acute cholecystitis is not excluded. Clinical correlation is recommended. Common bile duct: Diameter: 10 mm Liver: The liver is unremarkable as visualized. Portal vein is patent on color Doppler imaging with normal direction of blood flow towards the liver. IVC: No abnormality visualized. Pancreas: The pancreas is poorly visualized and obscured by bowel gas. Spleen: Size and appearance within normal limits. Right Kidney: Length: 10 cm. Slightly lobulated renal cortex likely related to areas of scarring. No hydronephrosis or shadowing stone. Left Kidney: Length: 11.4 cm. Lobulated renal cortex with areas of scarring. No hydronephrosis or shadowing stone. Abdominal aorta: Suboptimally evaluated due to bowel gas. The aorta measures up to 2.8 cm in diameter. Other findings: None. IMPRESSION: Gallbladder sludge. Thickened and edematous gallbladder wall without sonographic Murphy's sign. Findings may be related to underlying systemic or liver disease although acute cholecystitis is not entirely excluded. A hepatobiliary scintigraphy may provide better evaluation of the gallbladder if there is a high clinical concern for acute cholecystitis . Electronically Signed   By: Milas Hock  Radparvar M.D.   On: 02/01/2018 05:05   Dg Abdomen Acute W/chest  Result Date: 02/01/2018 CLINICAL DATA:  Abdominal pain x3 hours with nausea and dry heaves. Current smoker. EXAM: DG ABDOMEN ACUTE W/ 1V CHEST CXR 11/13/2017, CT abdomen and pelvis 07/21/2017 FINDINGS: Top-normal size heart with  uncoiled appearance of the thoracic aorta. No acute pulmonary consolidation or CHF. No effusion or pneumothorax. There is no free air beneath the diaphragm. Surgical clips project over the cardiac silhouette which on prior comparison studies localize over the dorsum of the mid and lower thoracic spine. Supine and right side up decubitus views of the abdomen demonstrate no free air. Moderate stool burden within the transverse and descending colon with scattered air containing small and large bowel loops in a nonobstructed, nondistended bowel gas pattern. Lumbar degenerative disc disease is noted from L3 through S1 with interbody fusion at L4-5. IMPRESSION: No active pulmonary disease. Increased colonic stool burden within the transverse and descending colon. No bowel obstruction is seen. Electronically Signed   By: Ashley Royalty M.D.   On: 02/01/2018 02:00    Review of Systems  Constitutional: Negative.   HENT: Negative.   Eyes: Negative.   Respiratory: Negative.   Cardiovascular: Negative.   Gastrointestinal: Positive for abdominal pain, nausea and vomiting.  Genitourinary: Negative.   Musculoskeletal: Positive for back pain.  Skin: Negative.   Neurological: Negative.   Endo/Heme/Allergies: Negative.   Psychiatric/Behavioral: Negative.     Blood pressure (!) 161/94, pulse (!) 51, temperature 97.9 F (36.6 C), temperature source Oral, resp. rate 14, SpO2 97 %. Physical Exam  Constitutional: He is oriented to person, place, and time. He appears well-developed and well-nourished. No distress.  HENT:  Head: Normocephalic and atraumatic.  Mouth/Throat: No oropharyngeal exudate.  Eyes: Pupils are equal, round, and reactive to light. Conjunctivae and EOM are normal.  Neck: Normal range of motion. Neck supple.  Cardiovascular: Normal rate, regular rhythm and normal heart sounds.  Respiratory: Effort normal and breath sounds normal. No stridor. No respiratory distress.  GI: Soft. Bowel sounds are  normal. There is tenderness.  Musculoskeletal: Normal range of motion. He exhibits no edema or tenderness.  Neurological: He is alert and oriented to person, place, and time. Coordination normal.  Skin: Skin is warm and dry. No erythema.  Psychiatric: He has a normal mood and affect. His behavior is normal. Thought content normal.     Assessment/Plan The patient appears to have cholecystitis with cholelithiasis and elevated lft's worrisome for cbd stones. I have asked GI to see as well. Will get MRCP today. Will likely benefit from having gallbladder removed during this hospitalization. There is possibility of open surgery given his history of previous upper abd surgery  Merrie Roof, MD 02/01/2018, 8:45 AM

## 2018-02-01 NOTE — Consult Note (Addendum)
[facility] Consult: 8:08 AM 02/01/2018  LOS: 0 days    Referring Provider: ED Dr Randal Buba Primary Care Physician:  Patient, No Pcp Per  Dr Jeanmarie Hubert (retired)>>Dr Hollace Kinnier but they have parted ways.   Primary Gastroenterologist:  Althia Forts.   Remotely (probably more than 20 years ago) Dr Sharlett Iles    Reason for Consultation:  Elevated LFTs, abd pain.     HPI: Austin Barnes is a 67 y.o. male.  Multiple medical problems including chronic pain.  DDD, spinal stenosis.  DM 2.  Adhesive Arachnoiditis. "15" previous spine surgeries per notes from [facility] 12/2017.  S/p spinal cord stimulator for pain mgt.   Chronic pancreatitis (but note normal looking pancreas on CT 07/2017). S/p Vagotomy and some sort of gastric bypass for ulcers remotely (well before 2011).  ASPVD, s/p surgical intervention for left popliteal occlusion.  Grade 1 diastolic dysfx, LVH, EF 60 to 65% on echo 11/30/17.    CT scan in 07/2017 showed 1. Intra and extrahepatic biliary ductal dilatation (11 mmm CBD) without identified cause. Given normal LFTs this is likely spurious. No pancreatic ductal dilatation. 2. Bilateral renal cortical scarring. Nonobstructing left nephrolithiasis. 3. Right inguinal testis. 4. Vague ill-defined right lower lobe ground-glass opacities in the lung bases, slightly tree in bud in morphology, suggesting pneumonitis, infectious or inflammatory. 5.  Aortic Atherosclerosis (ICD10-I70.0).    Following a parting of ways with Dr. Mariea Clonts, the patient has not had access to pain meds for 3 or 4 months.  Was on chronic narcotics, Methadone.  He is continuing to take several of his prescribed medications including blood pressure and thyroid meds but is not taking PPI.  Not taking aspirin products or acetaminophen.  4 nights ago  patient had severe pain with nausea, dry heaves that lasted for about 2 hours.  Pain located in the upper abdomen without radiation.  6 AM today onset of the same type of pain with some nausea.  He came to the ED.  Pain better but not gone following 1 mg Dilaudid.  Denies change in the color of his urine.   Lipase 44.  T bili 1.5.  Alk phos 225.   AST/ALT 1460/710.  APAP level <10.   Acute hepatitis serologies pending Normal CBC, coags, renal fx.   Abd [place]: GB sludge and wall edema.  Trace pericholecystic fluid. 10 mm CBD.   MRCP ordered.    Patient says he has had EGD and colonoscopy in the remote past.  There are no records of this in epic.  He says he was diagnosed with "hereditary "pancreatitis starting in 1980. Mother and maternal uncles all had problems with the pancreas.  His mother had pancreatitis and ultimately pancreatic cancer.  Uncles also had pancreatic cancer.  Past Medical History:  Diagnosis Date  . 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 Anemia   . Arthritis   . Chronic pain   . Controlled type 2 DM with peripheral circulatory disorder ([place]) 04/08/2013  . DDD (degenerative disc disease), cervical    Patient reported   . DDD (  degenerative disc disease), lumbar    Patient reported   . DDD (degenerative disc disease), thoracic    Patient Reported   . DVT (deep venous thrombosis) ([place])   . Heart murmur   . Hiatal hernia   . Hypothyroidism 12/14/2014  . Impotence   . Other and unspecified hyperlipidemia   . Pancreatitis, chronic ([place]) 12/14/2014  . Peripheral vascular disease ([place])   . Pneumonia    double pneumonia in [place]  . Reflux   . Ruptured disc, cervical    Patient reported   . Ruptured disc, thoracic    Patient reported   . Spinal stenosis, thoracic    Patient reported   . Stenosis of lumbosacral spine    Patient reported   . Stenosis, cervical spine    Patient reported   . Testosterone insufficiency 2013  . Ulcer    Peptic disease  . Unspecified  essential hypertension     Past Surgical History:  Procedure Laterality Date  . BACK SURGERY     15 back surgeries, starting at age 48   . CARPAL TUNNEL RELEASE Right 1995  . GASTROJEJUNOSTOMY    . KIDNEY STONE SURGERY    . LEG SURGERY     right leg  . SPINE SURGERY    . TRUNCAL VAGOTOMY  1995    Prior to Admission medications   Medication Sig Start Date End Date Taking? Authorizing Provider  albuterol (VENTOLIN HFA) 108 (90 Base) MCG/ACT inhaler Use 2 puffs every 6 hours as needed for shortness of breath and wheezing 08/07/17  Yes Reed, Tiffany L, DO  diclofenac sodium (VOLTAREN) 1 % GEL Apply 2-4gm to affected area up to 4 times daily 08/07/17  Yes Reed, Tiffany L, DO  HYDROmorphone (DILAUDID) 8 MG tablet Take 1 tablet (8 mg total) by mouth every 8 (eight) hours as needed for severe pain. No driving while taking this 08/30/17  Yes Reed, Tiffany L, DO  levothyroxine (SYNTHROID, LEVOTHROID) 75 MCG tablet Take one tablet by mouth 30 minutes before breakfast for thyroid 05/02/17  Yes Reed, Tiffany L, DO  losartan (COZAAR) 50 MG tablet Take 50 mg by mouth daily. 04/29/17  Yes [provider]  methadone (DOLOPHINE) 10 MG tablet Take two tablets by mouth every 6 hours as needed to control pain 09/05/17  Yes Reed, Tiffany L, DO  Methylnaltrexone Bromide (RELISTOR) 150 MG TABS Take 4 tablets by mouth daily. 08/12/17  Yes Reed, Tiffany L, DO  minoxidil (LONITEN) 10 MG tablet TAKE 1 TABLET DAILY TO CONTROL BLOOD PRESSURE. 05/02/17  Yes Reed, Tiffany L, DO  nitroGLYCERIN (NITRO-BID) 2 % ointment APPLY 1 INCH STRIP TO FEET TWICE DAILY. 09/02/17  Yes Reed, Tiffany L, DO  omeprazole (PRILOSEC) 20 MG capsule TAKE (2) CAPSULES TWICE DAILY. Patient taking differently: Take one capsule by mouth twice daily as needed for indigestion. 05/14/16  Yes Estill Dooms, MD  pregabalin (LYRICA) 25 MG capsule Take 1 capsule (25 mg total) by mouth 2 (two) times daily. 08/23/17  Yes Reed, Tiffany L, DO  temazepam  (RESTORIL) 7.5 MG capsule Take one capsule by mouth once daily at bedtime as needed for rest 08/07/17  Yes Reed, Tiffany L, DO  Testosterone (ANDROGEL PUMP) 20.25 MG/ACT (1.62%) GEL Apply 1 Pump topically every other day AND 2 Pump every other day. On opposite days. 09/02/17  Yes Reed, Tiffany L, DO  baclofen (LIORESAL) 10 MG tablet Take one tablet by mouth three times daily as needed for muscle relaxation Patient  not taking: Reported on 02/01/2018 08/07/17   Reed, Tiffany L, DO  diazepam (VALIUM) 2 MG tablet TAKE 1 TABLET EVERY 6 HOURS AS NEEDED FOR MUSCLE SPASM. Patient not taking: Reported on 11/13/2017 08/23/17   Hollace Kinnier L, DO  prochlorperazine (COMPAZINE) 10 MG tablet Take one tablet by mouth every 8 hours as needed for nausea Patient not taking: Reported on 02/01/2018 08/07/17   Hollace Kinnier L, DO  sucralfate (CARAFATE) 1 g tablet Take 1 tablet (1 g total) by mouth 4 (four) times daily -  with meals and at bedtime. Patient not taking: Reported on 02/01/2018 11/13/17   Varney Biles, MD    Scheduled Meds:  Infusions:  PRN Meds:    Allergies as of 01/31/2018 - Review Complete 01/31/2018  Allergen Reaction Noted  . Disalcid [salsalate] Other (See Comments) 10/31/2011  . Feldene [piroxicam] Other (See Comments) 10/31/2011  . Hctz [hydrochlorothiazide] Other (See Comments) 07/21/2017  . Other Nausea And Vomiting 07/21/2017  . Penicillins Other (See Comments) 10/31/2011  . Codeine Rash 10/31/2011  . Demerol Hives and Rash 10/31/2011  . Sulfa antibiotics Rash 10/31/2011    Family History  Problem Relation Age of Onset  . Hypertension Mother   . Stroke Father 10    Social History   Socioeconomic History  . Marital status: Single    Spouse name: Not on file  . Number of children: Not on file  . Years of education: Not on file  . Highest education level: Not on file  Occupational History  . Occupation: disabled  Social Needs  . Financial resource strain: Not on file  . Food  insecurity:    Worry: Not on file    Inability: Not on file  . Transportation needs:    Medical: Not on file    Non-medical: Not on file  Tobacco Use  . Smoking status: Current Some Day Smoker    Packs/day: 0.25    Years: 3.00    Pack years: 0.75    Types: Cigarettes  . Smokeless tobacco: Former Systems developer    Quit date: 07/17/2007  . Tobacco comment: Last quit date was 8-10 years ago as of 2018   Substance and Sexual Activity  . Alcohol use: No    Alcohol/week: 0.0 oz  . Drug use: No  . Sexual activity: Never    Partners: Female  Lifestyle  . Physical activity:    Days per week: Not on file    Minutes per session: Not on file  . Stress: Not on file  Relationships  . Social connections:    Talks on phone: Not on file    Gets together: Not on file    Attends religious service: Not on file    Active member of club or organization: Not on file    Attends meetings of clubs or organizations: Not on file    Relationship status: Not on file  . Intimate partner violence:    Fear of current or ex partner: Not on file    Emotionally abused: Not on file    Physically abused: Not on file    Forced sexual activity: Not on file  Other Topics Concern  . Not on file  Social History Narrative   Divorced x 1.    Lives alone.    REVIEW OF SYSTEMS: Constitutional: No profound weakness or fatigue. ENT:  No nose bleeds Pulm: No shortness of breath.  No cough. CV:  No palpitations, no LE edema.  No chest pain. GU:  No hematuria, no frequency GI:  Per HPI Heme: No unusual bleeding or bruising. Transfusions: No epic records of prior transfusions. Neuro:  No headaches, no seizures. Derm:  No itching, no rash or sores.  Endocrine:  No sweats or chills.  No polyuria or dysuria Immunization: Not queried. Travel:  None beyond local counties in last few months.    PHYSICAL EXAM: Vital signs in last 24 hours: Vitals:   02/01/18 0700 02/01/18 0730  BP: 115/64 (!) 156/104  Pulse: (!) 57     Resp: 14 15  Temp:    SpO2: 98%    Wt Readings from Last 3 Encounters:  07/25/17 147 lb (66.7 kg)  07/21/17 168 lb (76.2 kg)  06/13/17 159 lb (72.1 kg)    General: Thin, pale, comfortable, somewhat chronically ill but not acutely ill appearing.  Not jaundiced. Head: No facial asymmetry, signs of head trauma or swelling. Eyes: No scleral icterus, no conjunctival pallor.  EOMI. Ears: Not hard of hearing. Nose:, No discharge. Mouth: Oropharynx with pink, clear, moist oral mucosa.  Tongue midline. Neck: JVD, no masses, no thyromegaly. Lungs: Clear bilaterally.  No labored breathing. Heart: RRR.  No MRG. Abdomen: Soft, non-distended.  Bowel sounds normal but hypoactive.  Mild to moderate tenderness in the epigastric region and along the right abdomen.  No guarding or rebound.  No HSM, masses, bruits, hernias.  Well-healed midline surgical scars..   Rectal: Deferred Musc/Skeltl: No joint redness, swelling, gross deformity.  Surgical scars on the spine. Extremities: No CCE.  Thin limbs. Neurologic: Alert and oriented x3.  Moves all 4 limbs, strength not tested.  No asterixis. Skin: No jaundice, no telangiectasia, no suspicious lesions, rash or sores. Nodes: No cervical adenopathy. Psych: Calm, pleasant, cooperative, almost childlike.  Intake/Output from previous day: No intake/output data recorded. Intake/Output this shift: No intake/output data recorded.  LAB RESULTS: Recent Labs    01/31/18 0100  WBC 9.3  HGB 13.9  HCT 42.2  PLT 380   BMET Lab Results  Component Value Date   NA 144 01/31/2018   NA 143 11/13/2017   NA 137 07/21/2017   K 4.1 01/31/2018   K 3.5 11/13/2017   K 3.8 07/21/2017   CL 107 01/31/2018   CL 105 11/13/2017   CL 102 07/21/2017   CO2 23 01/31/2018   CO2 25 11/13/2017   CO2 25 07/21/2017   GLUCOSE 160 (H) 01/31/2018   GLUCOSE 119 (H) 11/13/2017   GLUCOSE 106 (H) 07/21/2017   BUN 19 01/31/2018   BUN 11 11/13/2017   BUN 12 07/21/2017    CREATININE 0.98 01/31/2018   CREATININE 1.05 11/13/2017   CREATININE 1.07 07/21/2017   CALCIUM 9.8 01/31/2018   CALCIUM 9.7 11/13/2017   CALCIUM 9.6 07/21/2017   LFT Recent Labs    01/31/18 0100  PROT 7.8  ALBUMIN 4.1  AST 1,460*  ALT 710*  ALKPHOS 225*  BILITOT 1.5*   PT/INR Lab Results  Component Value Date   INR 1.01 02/01/2018   Hepatitis Panel No results for input(s): HEPBSAG, HCVAB, HEPAIGM, HEPBIGM in the last 72 hours. C-Diff No components found for: CDIFF Lipase     Component Value Date/Time   LIPASE 44 01/31/2018 0100    Drugs of Abuse  No results found for: LABOPIA, COCAINSCRNUR, LABBENZ, AMPHETMU, THCU, LABBARB   RADIOLOGY STUDIES: US Abdomen Complete  Result Date: 02/01/2018 CLINICAL DATA:  67 year old male with abdominal pain. Elevated liver enzymes. EXAM: ABDOMEN ULTRASOUND COMPLETE COMPARISON:  CT of the abdomen  pelvis dated 07/21/2017 FINDINGS: Gallbladder: There is small amount of sludge within the gallbladder. The gallbladder wall is thickened and edematous measuring 7 mm. Trace pericholecystic fluid may be present. Although findings may be related to underlying systemic etiology or secondary to liver disease, acute cholecystitis is not excluded. Clinical correlation is recommended. Common bile duct: Diameter: 10 mm Liver: The liver is unremarkable as visualized. Portal vein is patent on color Doppler imaging with normal direction of blood flow towards the liver. IVC: No abnormality visualized. Pancreas: The pancreas is poorly visualized and obscured by bowel gas. Spleen: Size and appearance within normal limits. Right Kidney: Length: 10 cm. Slightly lobulated renal cortex likely related to areas of scarring. No hydronephrosis or shadowing stone. Left Kidney: Length: 11.4 cm. Lobulated renal cortex with areas of scarring. No hydronephrosis or shadowing stone. Abdominal aorta: Suboptimally evaluated due to bowel gas. The aorta measures up to 2.8 cm in  diameter. Other findings: None. IMPRESSION: Gallbladder sludge. Thickened and edematous gallbladder wall without sonographic Murphy's sign. Findings may be related to underlying systemic or liver disease although acute cholecystitis is not entirely excluded. A hepatobiliary scintigraphy may provide better evaluation of the gallbladder if there is a high clinical concern for acute cholecystitis . Electronically Signed   By: Anner Crete M.D.   On: 02/01/2018 05:05   Dg Abdomen Acute W/chest  Result Date: 02/01/2018 CLINICAL DATA:  Abdominal pain x3 hours with nausea and dry heaves. Current smoker. EXAM: DG ABDOMEN ACUTE W/ 1V CHEST CXR 11/13/2017, CT abdomen and pelvis 07/21/2017 FINDINGS: Top-normal size heart with uncoiled appearance of the thoracic aorta. No acute pulmonary consolidation or CHF. No effusion or pneumothorax. There is no free air beneath the diaphragm. Surgical clips project over the cardiac silhouette which on prior comparison studies localize over the dorsum of the mid and lower thoracic spine. Supine and right side up decubitus views of the abdomen demonstrate no free air. Moderate stool burden within the transverse and descending colon with scattered air containing small and large bowel loops in a nonobstructed, nondistended bowel gas pattern. Lumbar degenerative disc disease is noted from L3 through S1 with interbody fusion at L4-5. IMPRESSION: No active pulmonary disease. Increased colonic stool burden within the transverse and descending colon. No bowel obstruction is seen. Electronically Signed   By: Ashley Royalty M.D.   On: 02/01/2018 02:00     IMPRESSION:   *    Acute upper abdominal pain with elevated LFTs.  Suspect cholecystitis. Rule out choledocholithiasis.  *   Remote hx PUD requiring vagotomy and gastric surgery of unclear details.    *   Chronic musculoskeletal pain.  *    Reported history of chronic pancreatitis, however no calcifications seen in the pancreas on  CT in January nor pancreatic issues on current ultrasound.  Lipase WNL.    *    DM 2.  On no meds for this currently  *    Hypertension    PLAN:     *   MRCP today.  Dr Marlou Starks from surgery has also seen the patient.    LFTs in AM.     Azucena Freed  02/01/2018, 8:08 AM Phone [phone]

## 2018-02-01 NOTE — ED Provider Notes (Signed)
[facility]-EMERGENCY DEPT Provider Note   CSN: [id number] Arrival date & time: 01/31/18  2139     History   Chief Complaint Chief Complaint  Patient presents with  . Abdominal Pain    HPI Austin Barnes is a 67 y.o. male with a hx of chronic pancreatitis, HTN, anemia, PVD, NIDDM, polysubstance abuse, PUD, testosterone use, arachnoiditis presents to the Emergency Department complaining of gradual, persistent, progressively worsening generalized abd pain onset 6-7pm.  Pt reports last oral intake was noon today.  Pt reports periumbilical pain described as knifelike and rated at a 10/10.  He denies radiation of the pain.  No aggravating or alleviating factors.  Maalox did not help.  Pt reports several abd surgeries including gastrojejunostomy.  Pt reports smoking marijuana.  Denies fever, chills, HA, neck pain, chest pain, SOB, N/V/D, weakness, dizziness, syncope, denies melena or hematochezia.    Record review shows that patient is in pain management taking approximately 8 mg of Dilaudid every 6 hours along with Valium 10 mg every 6 hours.  Additionally, patient is taking 40 mg of methadone every 6 hours and 5 mg of oxycodone every 4 hours.  He reports he is not taking any of these.  Access of the [facility] narcotic database shows no prescriptions of these medications filled in [place] since April 2019.   The history is provided by the patient and medical records. No language interpreter was used.    Past Medical History:  Diagnosis Date  . 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 Anemia   . Arthritis   . Chronic pain   . 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 DDD (degenerative disc disease), cervical    Patient reported   . DDD (degenerative disc disease), lumbar    Patient reported   . DDD (degenerative disc disease), thoracic    Patient Reported   . DVT (deep venous thrombosis) (HCC)   . Heart murmur   . Hiatal  hernia   . Hypothyroidism 12/14/2014  . Impotence   . Other and unspecified hyperlipidemia   . 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 Peripheral vascular disease (HCC)   . Pneumonia    double pneumonia in High School  . Reflux   . Ruptured disc, cervical    Patient reported   . Ruptured disc, thoracic    Patient reported   . Spinal stenosis, thoracic    Patient reported   . Stenosis of lumbosacral spine    Patient reported   . Stenosis, cervical spine    Patient reported   . Testosterone insufficiency 2013  . Ulcer    Peptic disease  . Unspecified essential hypertension     Patient Active Problem List   Diagnosis Date Noted  . 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 Shoulder pain 10/30/2016  . 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 Leg pain 08/21/2016  . Tremor 08/21/2016  . Cough 07/06/2016  . Acute bronchitis 04/18/2016  . Loss of weight 12/14/2015  . Weak 12/14/2015  . Myalgia and myositis 12/14/2015  . Frequency of urination 12/14/2015  . Hypothyroidism 12/14/2014  . 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 Malaise 01/27/2014  . Hip pain 01/27/2014  . Pain in left elbow 01/27/2014  . Ganglion cyst 06/23/2013  . Chronic pain 04/08/2013  . 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 Arthritis   . Essential hypertension   . Hiatal hernia   .  Peripheral vascular disease (HCC)   . GERD (gastroesophageal reflux disease)   . Anemia   . Testosterone insufficiency   . Hyperlipidemia     Past Surgical History:  Procedure Laterality Date  . BACK SURGERY     15 back surgeries, starting at age 24   . CARPAL TUNNEL RELEASE Right 1995  . GASTROJEJUNOSTOMY    . KIDNEY STONE SURGERY    . LEG SURGERY     right leg  . SPINE SURGERY    . TRUNCAL VAGOTOMY  1995        Home Medications    Prior to Admission medications   Medication Sig Start Date End Date Taking? Authorizing Provider  albuterol  (VENTOLIN HFA) 108 (90 Base) MCG/ACT inhaler Use 2 puffs every 6 hours as needed for shortness of breath and wheezing 08/07/17  Yes Reed, Tiffany L, DO  diclofenac sodium (VOLTAREN) 1 % GEL Apply 2-4gm to affected area up to 4 times daily 08/07/17  Yes Reed, Tiffany L, DO  HYDROmorphone (DILAUDID) 8 MG tablet Take 1 tablet (8 mg total) by mouth every 8 (eight) hours as needed for severe pain. No driving while taking this 08/30/17  Yes Reed, Tiffany L, DO  levothyroxine (SYNTHROID, LEVOTHROID) 75 MCG tablet Take one tablet by mouth 30 minutes before breakfast for thyroid 05/02/17  Yes Reed, Tiffany L, DO  losartan (COZAAR) 50 MG tablet Take 50 mg by mouth daily. 04/29/17  Yes [provider]  methadone (DOLOPHINE) 10 MG tablet Take two tablets by mouth every 6 hours as needed to control pain 09/05/17  Yes Reed, Tiffany L, DO  Methylnaltrexone Bromide (RELISTOR) 150 MG TABS Take 4 tablets by mouth daily. 08/12/17  Yes Reed, Tiffany L, DO  minoxidil (LONITEN) 10 MG tablet TAKE 1 TABLET DAILY TO CONTROL BLOOD PRESSURE. 05/02/17  Yes Reed, Tiffany L, DO  nitroGLYCERIN (NITRO-BID) 2 % ointment APPLY 1 INCH STRIP TO FEET TWICE DAILY. 09/02/17  Yes Reed, Tiffany L, DO  omeprazole (PRILOSEC) 20 MG capsule TAKE (2) CAPSULES TWICE DAILY. Patient taking differently: Take one capsule by mouth twice daily as needed for indigestion. 05/14/16  Yes Kimber Relic, MD  pregabalin (LYRICA) 25 MG capsule Take 1 capsule (25 mg total) by mouth 2 (two) times daily. 08/23/17  Yes Reed, Tiffany L, DO  temazepam (RESTORIL) 7.5 MG capsule Take one capsule by mouth once daily at bedtime as needed for rest 08/07/17  Yes Reed, Tiffany L, DO  Testosterone (ANDROGEL PUMP) 20.25 MG/ACT (1.62%) GEL Apply 1 Pump topically every other day AND 2 Pump every other day. On opposite days. 09/02/17  Yes Reed, Tiffany L, DO  baclofen (LIORESAL) 10 MG tablet Take one tablet by mouth three times daily as needed for muscle relaxation Patient not  taking: Reported on 02/01/2018 08/07/17   Reed, Tiffany L, DO  diazepam (VALIUM) 2 MG tablet TAKE 1 TABLET EVERY 6 HOURS AS NEEDED FOR MUSCLE SPASM. Patient not taking: Reported on 11/13/2017 08/23/17   Bufford Spikes L, DO  prochlorperazine (COMPAZINE) 10 MG tablet Take one tablet by mouth every 8 hours as needed for nausea Patient not taking: Reported on 02/01/2018 08/07/17   Bufford Spikes L, DO  sucralfate (CARAFATE) 1 g tablet Take 1 tablet (1 g total) by mouth 4 (four) times daily -  with meals and at bedtime. Patient not taking: Reported on 02/01/2018 11/13/17   Derwood Kaplan, MD    Family History Family History  Problem Relation Age of Onset  .  Hypertension Mother   . Stroke Father 53    Social History Social History   Tobacco Use  . Smoking status: Current Some Day Smoker    Packs/day: 0.25    Years: 3.00    Pack years: 0.75    Types: Cigarettes  . Smokeless tobacco: Former Neurosurgeon    Quit date: 07/17/2007  . Tobacco comment: Last quit date was 8-10 years ago as of 2018   Substance Use Topics  . Alcohol use: No    Alcohol/week: 0.0 oz  . Drug use: No     Allergies   Disalcid [salsalate]; Feldene [piroxicam]; Hctz [hydrochlorothiazide]; Other; Penicillins; Codeine; Demerol; and Sulfa antibiotics   Review of Systems Review of Systems  Constitutional: Negative for appetite change, diaphoresis, fatigue, fever and unexpected weight change.  HENT: Negative for mouth sores.   Eyes: Negative for visual disturbance.  Respiratory: Negative for cough, chest tightness, shortness of breath and wheezing.   Cardiovascular: Negative for chest pain.  Gastrointestinal: Positive for abdominal pain and nausea. Negative for constipation, diarrhea and vomiting.  Endocrine: Negative for polydipsia, polyphagia and polyuria.  Genitourinary: Negative for dysuria, frequency, hematuria and urgency.  Musculoskeletal: Negative for back pain and neck stiffness.  Skin: Negative for rash.    Allergic/Immunologic: Negative for immunocompromised state.  Neurological: Negative for syncope, light-headedness and headaches.  Hematological: Does not bruise/bleed easily.  Psychiatric/Behavioral: Negative for sleep disturbance. The patient is not nervous/anxious.      Physical Exam Updated Vital Signs BP (!) 210/99 (BP Location: Left Arm)   Pulse 69   Temp 97.9 F (36.6 C) (Oral)   Resp 18   SpO2 100%   Physical Exam  Constitutional: He appears well-developed and well-nourished. He appears distressed ( Patient appears uncomfortable).  Awake, alert, nontoxic appearance  HENT:  Head: Normocephalic and atraumatic.  Mouth/Throat: Oropharynx is clear and moist. No oropharyngeal exudate.  Eyes: Conjunctivae are normal. No scleral icterus.  Neck: Normal range of motion. Neck supple.  Cardiovascular: Normal rate, regular rhythm and intact distal pulses.  Pulmonary/Chest: Effort normal and breath sounds normal. No respiratory distress. He has no wheezes.  Equal chest expansion  Abdominal: Soft. Bowel sounds are normal. He exhibits no mass. There is generalized tenderness. There is guarding. There is no rigidity, no rebound and no CVA tenderness.  Well-healed surgical incisions  Musculoskeletal: Normal range of motion. He exhibits no edema.  Neurological: He is alert.  Speech is clear and goal oriented Moves extremities without ataxia  Skin: Skin is warm and dry. He is not diaphoretic.  Psychiatric: He has a normal mood and affect.  Nursing note and vitals reviewed.    ED Treatments / Results  Labs (all labs ordered are listed, but only abnormal results are displayed) Labs Reviewed  CBC WITH DIFFERENTIAL/PLATELET - Abnormal; Notable for the following components:      Result Value   Neutro Abs 8.2 (*)    All other components within normal limits  COMPREHENSIVE METABOLIC PANEL - Abnormal; Notable for the following components:   Glucose, Bld 160 (*)    AST 1,460 (*)    ALT  710 (*)    Alkaline Phosphatase 225 (*)    Total Bilirubin 1.5 (*)    All other components within normal limits  URINALYSIS, ROUTINE W REFLEX MICROSCOPIC - Abnormal; Notable for the following components:   Hgb urine dipstick SMALL (*)    Protein, ur 100 (*)    All other components within normal limits  ACETAMINOPHEN LEVEL -  Abnormal; Notable for the following components:   Acetaminophen (Tylenol), Serum <10 (*)    All other components within normal limits  CBG MONITORING, ED - Abnormal; Notable for the following components:   Glucose-Capillary 170 (*)    All other components within normal limits  LIPASE, BLOOD  ETHANOL  SALICYLATE LEVEL  PROTIME-INR  RAPID URINE DRUG SCREEN, HOSP PERFORMED  HEPATITIS PANEL, ACUTE     Radiology US Abdomen Complete  Result Date: 02/01/2018 CLINICAL DATA:  67 year old male with abdominal pain. Elevated liver enzymes. EXAM: ABDOMEN ULTRASOUND COMPLETE COMPARISON:  CT of the abdomen pelvis dated 07/21/2017 FINDINGS: Gallbladder: There is small amount of sludge within the gallbladder. The gallbladder wall is thickened and edematous measuring 7 mm. Trace pericholecystic fluid may be present. Although findings may be related to underlying systemic etiology or secondary to liver disease, acute cholecystitis is not excluded. Clinical correlation is recommended. Common bile duct: Diameter: 10 mm Liver: The liver is unremarkable as visualized. Portal vein is patent on color Doppler imaging with normal direction of blood flow towards the liver. IVC: No abnormality visualized. Pancreas: The pancreas is poorly visualized and obscured by bowel gas. Spleen: Size and appearance within normal limits. Right Kidney: Length: 10 cm. Slightly lobulated renal cortex likely related to areas of scarring. No hydronephrosis or shadowing stone. Left Kidney: Length: 11.4 cm. Lobulated renal cortex with areas of scarring. No hydronephrosis or shadowing stone. Abdominal aorta: Suboptimally  evaluated due to bowel gas. The aorta measures up to 2.8 cm in diameter. Other findings: None. IMPRESSION: Gallbladder sludge. Thickened and edematous gallbladder wall without sonographic Murphy's sign. Findings may be related to underlying systemic or liver disease although acute cholecystitis is not entirely excluded. A hepatobiliary scintigraphy may provide better evaluation of the gallbladder if there is a high clinical concern for acute cholecystitis . Electronically Signed   By: Elgie Collard M.D.   On: 02/01/2018 05:05   Dg Abdomen Acute W/chest  Result Date: 02/01/2018 CLINICAL DATA:  Abdominal pain x3 hours with nausea and dry heaves. Current smoker. EXAM: DG ABDOMEN ACUTE W/ 1V CHEST CXR 11/13/2017, CT abdomen and pelvis 07/21/2017 FINDINGS: Top-normal size heart with uncoiled appearance of the thoracic aorta. No acute pulmonary consolidation or CHF. No effusion or pneumothorax. There is no free air beneath the diaphragm. Surgical clips project over the cardiac silhouette which on prior comparison studies localize over the dorsum of the mid and lower thoracic spine. Supine and right side up decubitus views of the abdomen demonstrate no free air. Moderate stool burden within the transverse and descending colon with scattered air containing small and large bowel loops in a nonobstructed, nondistended bowel gas pattern. Lumbar degenerative disc disease is noted from L3 through S1 with interbody fusion at L4-5. IMPRESSION: No active pulmonary disease. Increased colonic stool burden within the transverse and descending colon. No bowel obstruction is seen. Electronically Signed   By: Tollie Eth M.D.   On: 02/01/2018 02:00    Procedures Procedures (including critical care time)  Medications Ordered in ED Medications  ondansetron (ZOFRAN) injection 4 mg (4 mg Intravenous Given 02/01/18 0155)  HYDROmorphone (DILAUDID) injection 1 mg (1 mg Intravenous Given 02/01/18 0156)     Initial Impression /  Assessment and Plan / ED Course  I have reviewed the triage vital signs and the nursing notes.  Pertinent labs & imaging results that were available during my care of the patient were reviewed by me and considered in my medical decision making (see chart for  details).  Clinical Course as of Feb 02 640  Sat Feb 01, 2018  0450 Protein(!): 100 [HM]  0450 Significant elevation in AST and ALT.  Record review shows this is not normal for patient.  AST(!): 1,460 [HM]    Clinical Course User Index [HM] Kalli Greenfield, Boyd KerbsHannah, PA-C    Presents for abdominal pain.  He has a history of polysubstance abuse, opiate abuse and chronic abdominal pain.  Today however his AST and ALT are significantly elevated.  He has no history of elevation of his liver enzymes.  He denies large-volume alcohol or Tylenol usage.  Tylenol level is negligible today.  Normal ultrasound shows thickened and edematous gallbladder with sludge.  No sonographic Murphy sign on exam however I believe this is likely the cause of patient's transaminitis.  He will need surgical evaluation and admission.  The patient was discussed with and seen by Dr. Nicanor AlconPalumbo who agrees with the treatment plan.  Discussed with Dr. Carolynne Edouardoth.He requests GI consult and will plan to admit.    Discussed with Dr. Rhea BeltonPyrtle who will evaluate.   Final Clinical Impressions(s) / ED Diagnoses   Final diagnoses:  Transaminitis  Cholecystitis  Epigastric pain    ED Discharge Orders    None       Mardene SayerMuthersbaugh, Boyd KerbsHannah, PA-C 02/01/18 0715    Palumbo, April, MD 02/01/18 [id number]

## 2018-02-02 DIAGNOSIS — K8063 Calculus of gallbladder and bile duct with acute cholecystitis with obstruction: Secondary | ICD-10-CM

## 2018-02-02 LAB — CBC
HEMATOCRIT: 38.3 % — AB (ref 39.0–52.0)
Hemoglobin: 11.9 g/dL — ABNORMAL LOW (ref 13.0–17.0)
MCH: 27.9 pg (ref 26.0–34.0)
MCHC: 31.1 g/dL (ref 30.0–36.0)
MCV: 89.7 fL (ref 78.0–100.0)
Platelets: 307 10*3/uL (ref 150–400)
RBC: 4.27 MIL/uL (ref 4.22–5.81)
RDW: 15.5 % (ref 11.5–15.5)
WBC: 6.6 10*3/uL (ref 4.0–10.5)

## 2018-02-02 LAB — COMPREHENSIVE METABOLIC PANEL
ALBUMIN: 3.6 g/dL (ref 3.5–5.0)
ALT: 931 U/L — ABNORMAL HIGH (ref 0–44)
AST: 597 U/L — AB (ref 15–41)
Alkaline Phosphatase: 247 U/L — ABNORMAL HIGH (ref 38–126)
Anion gap: 7 (ref 5–15)
BILIRUBIN TOTAL: 1.7 mg/dL — AB (ref 0.3–1.2)
BUN: 15 mg/dL (ref 8–23)
CO2: 30 mmol/L (ref 22–32)
Calcium: 9.3 mg/dL (ref 8.9–10.3)
Chloride: 104 mmol/L (ref 98–111)
Creatinine, Ser: 0.99 mg/dL (ref 0.61–1.24)
GFR calc Af Amer: >60 mL/min (ref >60)
GFR calc non Af Amer: >60 mL/min (ref >60)
GLUCOSE: 112 mg/dL — AB (ref 70–99)
POTASSIUM: 4.2 mmol/L (ref 3.5–5.1)
Sodium: 141 mmol/L (ref 135–145)
TOTAL PROTEIN: 6.9 g/dL (ref 6.5–8.1)

## 2018-02-02 LAB — HIV ANTIBODY (ROUTINE TESTING W REFLEX): HIV Screen 4th Generation wRfx: NONREACTIVE

## 2018-02-02 MED ORDER — PANTOPRAZOLE SODIUM 40 MG PO TBEC
40.0000 mg | DELAYED_RELEASE_TABLET | Freq: Every day | ORAL | Status: DC
  Administered 2018-02-04 – 2018-02-06 (×3): 40 mg via ORAL
  Filled 2018-02-02 (×3): qty 1

## 2018-02-02 MED ORDER — DIPHENHYDRAMINE HCL 50 MG/ML IJ SOLN
25.0000 mg | Freq: Four times a day (QID) | INTRAMUSCULAR | Status: DC | PRN
  Administered 2018-02-02: 25 mg via INTRAVENOUS
  Filled 2018-02-02: qty 1

## 2018-02-02 NOTE — H&P (View-Only) (Signed)
Daily Rounding Note  02/02/2018, 8:12 AM  LOS: 1 day   SUBJECTIVE:   Chief complaint: abdominal pain persists but improved.  Also having his usual MS pain.  No N/V, hungry but NPO to allow for UGI series.   No BM.    OBJECTIVE:         Vital signs in last 24 hours:    Temp:  [97.7 F (36.5 C)-98.8 F (37.1 C)] 97.8 F (36.6 C) (07/21 0519) Pulse Rate:  [50-59] 51 (07/21 0519) Resp:  [13-18] 16 (07/21 0519) BP: (141-166)/(80-102) 145/81 (07/21 0519) SpO2:  [98 %-100 %] 99 % (07/21 0519) Weight:  [177 lb 0.5 oz (80.3 kg)] 177 lb 0.5 oz (80.3 kg) (07/20 2137) Last BM Date: 01/31/18 Filed Weights   02/01/18 2137  Weight: 177 lb 0.5 oz (80.3 kg)   General: alert, thin, not ill looking   Heart: RRR Chest: clear bil.  No labored breathing.  No cough Abdomen: soft, ND.  Mild RUQ tenderness.    Extremities: no CCE.   Neuro/Psych:  Oriented x 3.  Fully alert, moves all limbs without tremor. Slightly anxious.    Intake/Output from previous day: 07/20 0701 - 07/21 0700 In: 1236.7 [I.V.:1186.7; IV Piggyback:50] Out: 1150 [Urine:1150]  Intake/Output this shift: No intake/output data recorded.  Lab Results: Recent Labs    01/31/18 0100 02/02/18 0450  WBC 9.3 6.6  HGB 13.9 11.9*  HCT 42.2 38.3*  PLT 380 307   BMET Recent Labs    01/31/18 0100 02/02/18 0450  NA 144 141  K 4.1 4.2  CL 107 104  CO2 23 30  GLUCOSE 160* 112*  BUN 19 15  CREATININE 0.98 0.99  CALCIUM 9.8 9.3   LFT Recent Labs    01/31/18 0100 02/02/18 0450  PROT 7.8 6.9  ALBUMIN 4.1 3.6  AST 1,460* 597*  ALT 710* 931*  ALKPHOS 225* 247*  BILITOT 1.5* 1.7*   PT/INR Recent Labs    02/01/18 0400  LABPROT 13.2  INR 1.01   Hepatitis Panel No results for input(s): HEPBSAG, HCVAB, HEPAIGM, HEPBIGM in the last 72 hours.  Studies/Results: US Abdomen Complete  Result Date: 02/01/2018 CLINICAL DATA:  67 year old male with abdominal  pain. Elevated liver enzymes. EXAM: ABDOMEN ULTRASOUND COMPLETE COMPARISON:  CT of the abdomen pelvis dated 07/21/2017 FINDINGS: Gallbladder: There is small amount of sludge within the gallbladder. The gallbladder wall is thickened and edematous measuring 7 mm. Trace pericholecystic fluid may be present. Although findings may be related to underlying systemic etiology or secondary to liver disease, acute cholecystitis is not excluded. Clinical correlation is recommended. Common bile duct: Diameter: 10 mm Liver: The liver is unremarkable as visualized. Portal vein is patent on color Doppler imaging with normal direction of blood flow towards the liver. IVC: No abnormality visualized. Pancreas: The pancreas is poorly visualized and obscured by bowel gas. Spleen: Size and appearance within normal limits. Right Kidney: Length: 10 cm. Slightly lobulated renal cortex likely related to areas of scarring. No hydronephrosis or shadowing stone. Left Kidney: Length: 11.4 cm. Lobulated renal cortex with areas of scarring. No hydronephrosis or shadowing stone. Abdominal aorta: Suboptimally evaluated due to bowel gas. The aorta measures up to 2.8 cm in diameter. Other findings: None. IMPRESSION: Gallbladder sludge. Thickened and edematous gallbladder wall without sonographic Murphy's sign. Findings may be related to underlying systemic or liver disease although acute cholecystitis is not entirely excluded. A hepatobiliary scintigraphy may provide better evaluation  of the gallbladder if there is a high clinical concern for acute cholecystitis . Electronically Signed   By: Elgie Collard M.D.   On: 02/01/2018 05:05    Mr Abdomen Mrcp Vivien Rossetti Contast  Result Date: 02/01/2018 CLINICAL DATA:  Abnormal liver function tests. Thickened gallbladder wall with sludge. Elevated LFTs. EXAM: MRI ABDOMEN WITHOUT AND WITH CONTRAST (INCLUDING MRCP) TECHNIQUE: Multiplanar multisequence MR imaging of the abdomen was performed both before and  after the administration of intravenous contrast. Heavily T2-weighted images of the biliary and pancreatic ducts were obtained, and three-dimensional MRCP images were rendered by post processing. CONTRAST:  15mL MULTIHANCE GADOBENATE DIMEGLUMINE 529 MG/ML IV SOLN COMPARISON:  Ultrasound 02/01/2018, CT 07/21/2017 FINDINGS: Lower chest:  Lung bases are clear. Hepatobiliary: There is mild gallbladder wall thickening 4 mm. No gallstones identified. The common bile duct common hepatic duct are prominent measure up to 8 mm which is mildly dilated but this is similar to comparison CT 07/21/2017. There is a 2-3 mm filling defect in the distal common bile duct seen on several series (image 36/8, image 35/3.) This may represent small distal common bile duct stone however the is not conclusive and the exam is limited by respiratory motion. No intrahepatic duct dilatation. Pancreas: No pancreatic inflammation. Pancreatic duct is normal caliber. No peripancreatic fluid collections. The T1 weighted images are degraded by respiratory motion Spleen: Normal spleen. Adrenals/urinary tract: Adrenal glands and kidneys are normal. Stomach/Bowel: Stomach and limited of the small bowel is unremarkable Vascular/Lymphatic: Abdominal aortic normal caliber. No retroperitoneal periportal lymphadenopathy. Musculoskeletal: No aggressive osseous lesion IMPRESSION: 1. Mild gallbladder wall thickening small amount pericholecystic fluid. No gallstones identified. 2. Mild common hepatic duct and common bile duct dilatation which is similar to comparison CT. Small 2-3 mm filling defect within the distal common bile duct does not appear occlusive but could represent small distal stone. Exam is limited by respiratory motion. 3. Small of fluid surrounds the liver. 4. No evidence pancreatitis. Electronically Signed   By: Genevive Bi M.D.   On: 02/01/2018 12:24   Dg Abdomen Acute W/chest  Result Date: 02/01/2018 CLINICAL DATA:  Abdominal pain x3  hours with nausea and dry heaves. Current smoker. EXAM: DG ABDOMEN ACUTE W/ 1V CHEST CXR 11/13/2017, CT abdomen and pelvis 07/21/2017 FINDINGS: Top-normal size heart with uncoiled appearance of the thoracic aorta. No acute pulmonary consolidation or CHF. No effusion or pneumothorax. There is no free air beneath the diaphragm. Surgical clips project over the cardiac silhouette which on prior comparison studies localize over the dorsum of the mid and lower thoracic spine. Supine and right side up decubitus views of the abdomen demonstrate no free air. Moderate stool burden within the transverse and descending colon with scattered air containing small and large bowel loops in a nonobstructed, nondistended bowel gas pattern. Lumbar degenerative disc disease is noted from L3 through S1 with interbody fusion at L4-5. IMPRESSION: No active pulmonary disease. Increased colonic stool burden within the transverse and descending colon. No bowel obstruction is seen. Electronically Signed   By: Tollie Eth M.D.   On: 02/01/2018 02:00   Scheduled Meds: . heparin  5,000 Units Subcutaneous Q8H  . levothyroxine  75 mcg Oral QAC breakfast  . losartan  50 mg Oral Daily  . pregabalin  25 mg Oral BID   Continuous Infusions: . dextrose 5 % and 0.9 % NaCl with KCl 20 mEq/L 100 mL/hr at 02/02/18 0214  . famotidine (PEPCID) IV Stopped (02/01/18 2206)   PRN Meds:.albuterol, HYDROmorphone (  DILAUDID) injection, methocarbamol, ondansetron **OR** ondansetron (ZOFRAN) IV, temazepam    ASSESMENT:   *   Cholecystitis  *   Choledocholithiasis.  LFTs improved.    *  Prior ulcer surgery, vagotomy and some sort of gastric resection.  UGI series ordered to try to define anatomy prior to attempting ERCP, surgery.  Taking Omeprazole 20 mg 1 to 2 x daily prn.  On IV pepcid currently.         PLAN   *  Switch the IV Pepcid to oral Protonix.      Jennye MoccasinSarah Verla Bryngelson  02/02/2018, 8:12 AM Phone [phone]

## 2018-02-02 NOTE — Progress Notes (Addendum)
        Daily Rounding Note  02/02/2018, 8:12 AM  LOS: 1 day   SUBJECTIVE:   Chief complaint: abdominal pain persists but improved.  Also having his usual MS pain.  No N/V, hungry but NPO to allow for UGI series.   No BM.    OBJECTIVE:         Vital signs in last 24 hours:    Temp:  [97.7 F (36.5 C)-98.8 F (37.1 C)] 97.8 F (36.6 C) (07/21 0519) Pulse Rate:  [50-59] 51 (07/21 0519) Resp:  [13-18] 16 (07/21 0519) BP: (141-166)/(80-102) 145/81 (07/21 0519) SpO2:  [98 %-100 %] 99 % (07/21 0519) Weight:  [177 lb 0.5 oz (80.3 kg)] 177 lb 0.5 oz (80.3 kg) (07/20 2137) Last BM Date: 01/31/18 Filed Weights   02/01/18 2137  Weight: 177 lb 0.5 oz (80.3 kg)   General: alert, thin, not ill looking   Heart: RRR Chest: clear bil.  No labored breathing.  No cough Abdomen: soft, ND.  Mild RUQ tenderness.    Extremities: no CCE.   Neuro/Psych:  Oriented x 3.  Fully alert, moves all limbs without tremor. Slightly anxious.    Intake/Output from previous day: 07/20 0701 - 07/21 0700 In: 1236.7 [I.V.:1186.7; IV Piggyback:50] Out: 1150 [Urine:1150]  Intake/Output this shift: No intake/output data recorded.  Lab Results: Recent Labs    01/31/18 0100 02/02/18 0450  WBC 9.3 6.6  HGB 13.9 11.9*  HCT 42.2 38.3*  PLT 380 307   BMET Recent Labs    01/31/18 0100 02/02/18 0450  NA 144 141  K 4.1 4.2  CL 107 104  CO2 23 30  GLUCOSE 160* 112*  BUN 19 15  CREATININE 0.98 0.99  CALCIUM 9.8 9.3   LFT Recent Labs    01/31/18 0100 02/02/18 0450  PROT 7.8 6.9  ALBUMIN 4.1 3.6  AST 1,460* 597*  ALT 710* 931*  ALKPHOS 225* 247*  BILITOT 1.5* 1.7*   PT/INR Recent Labs    02/01/18 0400  LABPROT 13.2  INR 1.01   Hepatitis Panel No results for input(s): HEPBSAG, HCVAB, HEPAIGM, HEPBIGM in the last 72 hours.  Studies/Results: Us Abdomen Complete  Result Date: 02/01/2018 CLINICAL DATA:  67-year-old male with abdominal  pain. Elevated liver enzymes. EXAM: ABDOMEN ULTRASOUND COMPLETE COMPARISON:  CT of the abdomen pelvis dated 07/21/2017 FINDINGS: Gallbladder: There is small amount of sludge within the gallbladder. The gallbladder wall is thickened and edematous measuring 7 mm. Trace pericholecystic fluid may be present. Although findings may be related to underlying systemic etiology or secondary to liver disease, acute cholecystitis is not excluded. Clinical correlation is recommended. Common bile duct: Diameter: 10 mm Liver: The liver is unremarkable as visualized. Portal vein is patent on color Doppler imaging with normal direction of blood flow towards the liver. IVC: No abnormality visualized. Pancreas: The pancreas is poorly visualized and obscured by bowel gas. Spleen: Size and appearance within normal limits. Right Kidney: Length: 10 cm. Slightly lobulated renal cortex likely related to areas of scarring. No hydronephrosis or shadowing stone. Left Kidney: Length: 11.4 cm. Lobulated renal cortex with areas of scarring. No hydronephrosis or shadowing stone. Abdominal aorta: Suboptimally evaluated due to bowel gas. The aorta measures up to 2.8 cm in diameter. Other findings: None. IMPRESSION: Gallbladder sludge. Thickened and edematous gallbladder wall without sonographic Murphy's sign. Findings may be related to underlying systemic or liver disease although acute cholecystitis is not entirely excluded. A hepatobiliary scintigraphy may provide better evaluation   of the gallbladder if there is a high clinical concern for acute cholecystitis . Electronically Signed   By: Arash  Radparvar M.D.   On: 02/01/2018 05:05    Mr Abdomen Mrcp W Wo Contast  Result Date: 02/01/2018 CLINICAL DATA:  Abnormal liver function tests. Thickened gallbladder wall with sludge. Elevated LFTs. EXAM: MRI ABDOMEN WITHOUT AND WITH CONTRAST (INCLUDING MRCP) TECHNIQUE: Multiplanar multisequence MR imaging of the abdomen was performed both before and  after the administration of intravenous contrast. Heavily T2-weighted images of the biliary and pancreatic ducts were obtained, and three-dimensional MRCP images were rendered by post processing. CONTRAST:  15mL MULTIHANCE GADOBENATE DIMEGLUMINE 529 MG/ML IV SOLN COMPARISON:  Ultrasound 02/01/2018, CT 07/21/2017 FINDINGS: Lower chest:  Lung bases are clear. Hepatobiliary: There is mild gallbladder wall thickening 4 mm. No gallstones identified. The common bile duct common hepatic duct are prominent measure up to 8 mm which is mildly dilated but this is similar to comparison CT 07/21/2017. There is a 2-3 mm filling defect in the distal common bile duct seen on several series (image 36/8, image 35/3.) This may represent small distal common bile duct stone however the is not conclusive and the exam is limited by respiratory motion. No intrahepatic duct dilatation. Pancreas: No pancreatic inflammation. Pancreatic duct is normal caliber. No peripancreatic fluid collections. The T1 weighted images are degraded by respiratory motion Spleen: Normal spleen. Adrenals/urinary tract: Adrenal glands and kidneys are normal. Stomach/Bowel: Stomach and limited of the small bowel is unremarkable Vascular/Lymphatic: Abdominal aortic normal caliber. No retroperitoneal periportal lymphadenopathy. Musculoskeletal: No aggressive osseous lesion IMPRESSION: 1. Mild gallbladder wall thickening small amount pericholecystic fluid. No gallstones identified. 2. Mild common hepatic duct and common bile duct dilatation which is similar to comparison CT. Small 2-3 mm filling defect within the distal common bile duct does not appear occlusive but could represent small distal stone. Exam is limited by respiratory motion. 3. Small of fluid surrounds the liver. 4. No evidence pancreatitis. Electronically Signed   By: Stewart  Edmunds M.D.   On: 02/01/2018 12:24   Dg Abdomen Acute W/chest  Result Date: 02/01/2018 CLINICAL DATA:  Abdominal pain x3  hours with nausea and dry heaves. Current smoker. EXAM: DG ABDOMEN ACUTE W/ 1V CHEST CXR 11/13/2017, CT abdomen and pelvis 07/21/2017 FINDINGS: Top-normal size heart with uncoiled appearance of the thoracic aorta. No acute pulmonary consolidation or CHF. No effusion or pneumothorax. There is no free air beneath the diaphragm. Surgical clips project over the cardiac silhouette which on prior comparison studies localize over the dorsum of the mid and lower thoracic spine. Supine and right side up decubitus views of the abdomen demonstrate no free air. Moderate stool burden within the transverse and descending colon with scattered air containing small and large bowel loops in a nonobstructed, nondistended bowel gas pattern. Lumbar degenerative disc disease is noted from L3 through S1 with interbody fusion at L4-5. IMPRESSION: No active pulmonary disease. Increased colonic stool burden within the transverse and descending colon. No bowel obstruction is seen. Electronically Signed   By: Alekxander  Kwon M.D.   On: 02/01/2018 02:00   Scheduled Meds: . heparin  5,000 Units Subcutaneous Q8H  . levothyroxine  75 mcg Oral QAC breakfast  . losartan  50 mg Oral Daily  . pregabalin  25 mg Oral BID   Continuous Infusions: . dextrose 5 % and 0.9 % NaCl with KCl 20 mEq/L 100 mL/hr at 02/02/18 0214  . famotidine (PEPCID) IV Stopped (02/01/18 2206)   PRN Meds:.albuterol, HYDROmorphone (  DILAUDID) injection, methocarbamol, ondansetron **OR** ondansetron (ZOFRAN) IV, temazepam    ASSESMENT:   *   Cholecystitis  *   Choledocholithiasis.  LFTs improved.    *  Prior ulcer surgery, vagotomy and some sort of gastric resection.  UGI series ordered to try to define anatomy prior to attempting ERCP, surgery.  Taking Omeprazole 20 mg 1 to 2 x daily prn.  On IV pepcid currently.         PLAN   *  Switch the IV Pepcid to oral Protonix.      Vidur Knust  02/02/2018, 8:12 AM Phone [phone] 

## 2018-02-02 NOTE — Progress Notes (Addendum)
Subjective/Chief Complaint: Feels ok. No big complaints   Objective: Vital signs in last 24 hours: Temp:  [97.7 F (36.5 C)-98.8 F (37.1 C)] 97.8 F (36.6 C) (07/21 0519) Pulse Rate:  [50-59] 51 (07/21 0519) Resp:  [13-18] 16 (07/21 0519) BP: (142-166)/(80-102) 145/81 (07/21 0519) SpO2:  [98 %-100 %] 99 % (07/21 0519) Weight:  [80.3 kg (177 lb 0.5 oz)] 80.3 kg (177 lb 0.5 oz) (07/20 2137) Last BM Date: 01/31/18  Intake/Output from previous day: 07/20 0701 - 07/21 0700 In: 1236.7 [I.V.:1186.7; IV Piggyback:50] Out: 1150 [Urine:1150] Intake/Output this shift: No intake/output data recorded.  General appearance: alert and cooperative Resp: clear to auscultation bilaterally Cardio: regular rate and rhythm GI: soft, mild right sided tenderness  Lab Results:  Recent Labs    01/31/18 0100 02/02/18 0450  WBC 9.3 6.6  HGB 13.9 11.9*  HCT 42.2 38.3*  PLT 380 307   BMET Recent Labs    01/31/18 0100 02/02/18 0450  NA 144 141  K 4.1 4.2  CL 107 104  CO2 23 30  GLUCOSE 160* 112*  BUN 19 15  CREATININE 0.98 0.99  CALCIUM 9.8 9.3   PT/INR Recent Labs    02/01/18 0400  LABPROT 13.2  INR 1.01   ABG No results for input(s): PHART, HCO3 in the last 72 hours.  Invalid input(s): PCO2, PO2  Studies/Results: US Abdomen Complete  Result Date: 02/01/2018 CLINICAL DATA:  67 year old male with abdominal pain. Elevated liver enzymes. EXAM: ABDOMEN ULTRASOUND COMPLETE COMPARISON:  CT of the abdomen pelvis dated 07/21/2017 FINDINGS: Gallbladder: There is small amount of sludge within the gallbladder. The gallbladder wall is thickened and edematous measuring 7 mm. Trace pericholecystic fluid may be present. Although findings may be related to underlying systemic etiology or secondary to liver disease, acute cholecystitis is not excluded. Clinical correlation is recommended. Common bile duct: Diameter: 10 mm Liver: The liver is unremarkable as visualized. Portal vein is  patent on color Doppler imaging with normal direction of blood flow towards the liver. IVC: No abnormality visualized. Pancreas: The pancreas is poorly visualized and obscured by bowel gas. Spleen: Size and appearance within normal limits. Right Kidney: Length: 10 cm. Slightly lobulated renal cortex likely related to areas of scarring. No hydronephrosis or shadowing stone. Left Kidney: Length: 11.4 cm. Lobulated renal cortex with areas of scarring. No hydronephrosis or shadowing stone. Abdominal aorta: Suboptimally evaluated due to bowel gas. The aorta measures up to 2.8 cm in diameter. Other findings: None. IMPRESSION: Gallbladder sludge. Thickened and edematous gallbladder wall without sonographic Murphy's sign. Findings may be related to underlying systemic or liver disease although acute cholecystitis is not entirely excluded. A hepatobiliary scintigraphy may provide better evaluation of the gallbladder if there is a high clinical concern for acute cholecystitis . Electronically Signed   By: Elgie Collard M.D.   On: 02/01/2018 05:05   Mr 3d Recon At Scanner  Result Date: 02/01/2018 CLINICAL DATA:  Abnormal liver function tests. Thickened gallbladder wall with sludge. Elevated LFTs. EXAM: MRI ABDOMEN WITHOUT AND WITH CONTRAST (INCLUDING MRCP) TECHNIQUE: Multiplanar multisequence MR imaging of the abdomen was performed both before and after the administration of intravenous contrast. Heavily T2-weighted images of the biliary and pancreatic ducts were obtained, and three-dimensional MRCP images were rendered by post processing. CONTRAST:  15mL MULTIHANCE GADOBENATE DIMEGLUMINE 529 MG/ML IV SOLN COMPARISON:  Ultrasound 02/01/2018, CT 07/21/2017 FINDINGS: Lower chest:  Lung bases are clear. Hepatobiliary: There is mild gallbladder wall thickening 4 mm. No gallstones identified.  The common bile duct common hepatic duct are prominent measure up to 8 mm which is mildly dilated but this is similar to comparison CT  07/21/2017. There is a 2-3 mm filling defect in the distal common bile duct seen on several series (image 36/8, image 35/3.) This may represent small distal common bile duct stone however the is not conclusive and the exam is limited by respiratory motion. No intrahepatic duct dilatation. Pancreas: No pancreatic inflammation. Pancreatic duct is normal caliber. No peripancreatic fluid collections. The T1 weighted images are degraded by respiratory motion Spleen: Normal spleen. Adrenals/urinary tract: Adrenal glands and kidneys are normal. Stomach/Bowel: Stomach and limited of the small bowel is unremarkable Vascular/Lymphatic: Abdominal aortic normal caliber. No retroperitoneal periportal lymphadenopathy. Musculoskeletal: No aggressive osseous lesion IMPRESSION: 1. Mild gallbladder wall thickening small amount pericholecystic fluid. No gallstones identified. 2. Mild common hepatic duct and common bile duct dilatation which is similar to comparison CT. Small 2-3 mm filling defect within the distal common bile duct does not appear occlusive but could represent small distal stone. Exam is limited by respiratory motion. 3. Small of fluid surrounds the liver. 4. No evidence pancreatitis. Electronically Signed   By: Genevive Bi M.D.   On: 02/01/2018 12:24   Dg Abdomen Acute W/chest  Result Date: 02/01/2018 CLINICAL DATA:  Abdominal pain x3 hours with nausea and dry heaves. Current smoker. EXAM: DG ABDOMEN ACUTE W/ 1V CHEST CXR 11/13/2017, CT abdomen and pelvis 07/21/2017 FINDINGS: Top-normal size heart with uncoiled appearance of the thoracic aorta. No acute pulmonary consolidation or CHF. No effusion or pneumothorax. There is no free air beneath the diaphragm. Surgical clips project over the cardiac silhouette which on prior comparison studies localize over the dorsum of the mid and lower thoracic spine. Supine and right side up decubitus views of the abdomen demonstrate no free air. Moderate stool burden within  the transverse and descending colon with scattered air containing small and large bowel loops in a nonobstructed, nondistended bowel gas pattern. Lumbar degenerative disc disease is noted from L3 through S1 with interbody fusion at L4-5. IMPRESSION: No active pulmonary disease. Increased colonic stool burden within the transverse and descending colon. No bowel obstruction is seen. Electronically Signed   By: Tollie Eth M.D.   On: 02/01/2018 02:00   Mr Abdomen Mrcp Vivien Rossetti Contast  Result Date: 02/01/2018 CLINICAL DATA:  Abnormal liver function tests. Thickened gallbladder wall with sludge. Elevated LFTs. EXAM: MRI ABDOMEN WITHOUT AND WITH CONTRAST (INCLUDING MRCP) TECHNIQUE: Multiplanar multisequence MR imaging of the abdomen was performed both before and after the administration of intravenous contrast. Heavily T2-weighted images of the biliary and pancreatic ducts were obtained, and three-dimensional MRCP images were rendered by post processing. CONTRAST:  15mL MULTIHANCE GADOBENATE DIMEGLUMINE 529 MG/ML IV SOLN COMPARISON:  Ultrasound 02/01/2018, CT 07/21/2017 FINDINGS: Lower chest:  Lung bases are clear. Hepatobiliary: There is mild gallbladder wall thickening 4 mm. No gallstones identified. The common bile duct common hepatic duct are prominent measure up to 8 mm which is mildly dilated but this is similar to comparison CT 07/21/2017. There is a 2-3 mm filling defect in the distal common bile duct seen on several series (image 36/8, image 35/3.) This may represent small distal common bile duct stone however the is not conclusive and the exam is limited by respiratory motion. No intrahepatic duct dilatation. Pancreas: No pancreatic inflammation. Pancreatic duct is normal caliber. No peripancreatic fluid collections. The T1 weighted images are degraded by respiratory motion Spleen: Normal spleen. Adrenals/urinary  tract: Adrenal glands and kidneys are normal. Stomach/Bowel: Stomach and limited of the small bowel  is unremarkable Vascular/Lymphatic: Abdominal aortic normal caliber. No retroperitoneal periportal lymphadenopathy. Musculoskeletal: No aggressive osseous lesion IMPRESSION: 1. Mild gallbladder wall thickening small amount pericholecystic fluid. No gallstones identified. 2. Mild common hepatic duct and common bile duct dilatation which is similar to comparison CT. Small 2-3 mm filling defect within the distal common bile duct does not appear occlusive but could represent small distal stone. Exam is limited by respiratory motion. 3. Small of fluid surrounds the liver. 4. No evidence pancreatitis. Electronically Signed   By: Genevive BiStewart  Edmunds M.D.   On: 02/01/2018 12:24    Anti-infectives: Anti-infectives (From admission, onward)   None      Assessment/Plan: s/p * No surgery found * MRI suggestive of cbd stone. Given his previous surgery we will plan for UGI today to define his anatomy. If his stomach and duodenum are open and in continuity then plan for ERCP followed by cholecystectomy Pt states that his medical doc, Dr. Dorris Fetchiffany Reid has his old op note  LOS: 1 day    TOTH III,PAUL S 02/02/2018

## 2018-02-03 ENCOUNTER — Inpatient Hospital Stay (HOSPITAL_COMMUNITY): Payer: Medicare Other | Admitting: Anesthesiology

## 2018-02-03 ENCOUNTER — Encounter (HOSPITAL_COMMUNITY): Admission: EM | Disposition: A | Discharge status: Home / Self Care | Payer: Self-pay | Source: Home / Self Care

## 2018-02-03 ENCOUNTER — Encounter (HOSPITAL_COMMUNITY): Payer: Self-pay | Admitting: Registered Nurse

## 2018-02-03 ENCOUNTER — Inpatient Hospital Stay (HOSPITAL_COMMUNITY): Payer: Medicare Other

## 2018-02-03 DIAGNOSIS — R945 Abnormal results of liver function studies: Secondary | ICD-10-CM

## 2018-02-03 DIAGNOSIS — R7989 Other specified abnormal findings of blood chemistry: Secondary | ICD-10-CM

## 2018-02-03 DIAGNOSIS — R1013 Epigastric pain: Secondary | ICD-10-CM

## 2018-02-03 DIAGNOSIS — K8050 Calculus of bile duct without cholangitis or cholecystitis without obstruction: Secondary | ICD-10-CM

## 2018-02-03 DIAGNOSIS — K838 Other specified diseases of biliary tract: Secondary | ICD-10-CM

## 2018-02-03 HISTORY — PX: ERCP: SHX5425

## 2018-02-03 HISTORY — PX: SPHINCTEROTOMY: SHX5544

## 2018-02-03 LAB — COMPREHENSIVE METABOLIC PANEL
ALK PHOS: 224 U/L — AB (ref 38–126)
ALT: 548 U/L — ABNORMAL HIGH (ref 0–44)
AST: 158 U/L — AB (ref 15–41)
Albumin: 3.7 g/dL (ref 3.5–5.0)
Anion gap: 6 (ref 5–15)
BILIRUBIN TOTAL: 1.1 mg/dL (ref 0.3–1.2)
BUN: 11 mg/dL (ref 8–23)
CALCIUM: 9.4 mg/dL (ref 8.9–10.3)
CO2: 30 mmol/L (ref 22–32)
Chloride: 104 mmol/L (ref 98–111)
Creatinine, Ser: 0.96 mg/dL (ref 0.61–1.24)
GFR calc Af Amer: >60 mL/min (ref >60)
Glucose, Bld: 116 mg/dL — ABNORMAL HIGH (ref 70–99)
Potassium: 3.7 mmol/L (ref 3.5–5.1)
Sodium: 140 mmol/L (ref 135–145)
TOTAL PROTEIN: 7 g/dL (ref 6.5–8.1)

## 2018-02-03 LAB — HEPATITIS PANEL, ACUTE
Hep A IgM: NEGATIVE
Hep B C IgM: NEGATIVE
Hepatitis B Surface Ag: NEGATIVE

## 2018-02-03 LAB — GLUCOSE, CAPILLARY: Glucose-Capillary: 102 mg/dL — ABNORMAL HIGH (ref 70–99)

## 2018-02-03 SURGERY — ERCP, WITH INTERVENTION IF INDICATED
Anesthesia: General

## 2018-02-03 MED ORDER — ROCURONIUM BROMIDE 10 MG/ML (PF) SYRINGE
PREFILLED_SYRINGE | INTRAVENOUS | Status: DC | PRN
  Administered 2018-02-03: 50 mg via INTRAVENOUS

## 2018-02-03 MED ORDER — FENTANYL CITRATE (PF) 250 MCG/5ML IJ SOLN
INTRAMUSCULAR | Status: DC | PRN
  Administered 2018-02-03 (×2): 50 ug via INTRAVENOUS

## 2018-02-03 MED ORDER — PREGABALIN 25 MG PO CAPS
25.0000 mg | ORAL_CAPSULE | Freq: Two times a day (BID) | ORAL | Status: DC
  Administered 2018-02-03 – 2018-02-06 (×6): 25 mg via ORAL
  Filled 2018-02-03 (×6): qty 1

## 2018-02-03 MED ORDER — CIPROFLOXACIN IN D5W 400 MG/200ML IV SOLN: 400.0000 mg | Freq: Once | INTRAVENOUS | Status: DC

## 2018-02-03 MED ORDER — INDOMETHACIN 50 MG RE SUPP
RECTAL | Status: DC | PRN
  Administered 2018-02-03: 100 mg via RECTAL

## 2018-02-03 MED ORDER — LEVOTHYROXINE SODIUM 75 MCG PO TABS
75.0000 ug | ORAL_TABLET | Freq: Every day | ORAL | Status: DC
  Administered 2018-02-04 – 2018-02-06 (×3): 75 ug via ORAL
  Filled 2018-02-03 (×3): qty 1

## 2018-02-03 MED ORDER — GLUCAGON HCL RDNA (DIAGNOSTIC) 1 MG IJ SOLR
INTRAMUSCULAR | Status: DC | PRN
  Administered 2018-02-03 (×3): 0.25 mg via INTRAVENOUS

## 2018-02-03 MED ORDER — MIDAZOLAM HCL 5 MG/5ML IJ SOLN
INTRAMUSCULAR | Status: DC | PRN
  Administered 2018-02-03: 2 mg via INTRAVENOUS

## 2018-02-03 MED ORDER — EPHEDRINE SULFATE-NACL 50-0.9 MG/10ML-% IV SOSY
PREFILLED_SYRINGE | INTRAVENOUS | Status: DC | PRN
  Administered 2018-02-03 (×2): 15 mg via INTRAVENOUS

## 2018-02-03 MED ORDER — LACTATED RINGERS IV SOLN
INTRAVENOUS | Status: DC
  Administered 2018-02-03: 1000 mL via INTRAVENOUS

## 2018-02-03 MED ORDER — LOSARTAN POTASSIUM 50 MG PO TABS
50.0000 mg | ORAL_TABLET | Freq: Every day | ORAL | Status: DC
Start: 2018-02-03 — End: 2018-02-06
  Administered 2018-02-03 – 2018-02-06 (×4): 50 mg via ORAL
  Filled 2018-02-03 (×4): qty 1

## 2018-02-03 MED ORDER — CIPROFLOXACIN IN D5W 400 MG/200ML IV SOLN
INTRAVENOUS | Status: AC
  Filled 2018-02-03: qty 200

## 2018-02-03 MED ORDER — GLUCAGON HCL RDNA (DIAGNOSTIC) 1 MG IJ SOLR
INTRAMUSCULAR | Status: AC
  Filled 2018-02-03: qty 1

## 2018-02-03 MED ORDER — NITROGLYCERIN 2 % TD OINT
0.5000 [in_us] | TOPICAL_OINTMENT | Freq: Two times a day (BID) | TRANSDERMAL | Status: DC
  Administered 2018-02-03 – 2018-02-06 (×5): 0.5 [in_us] via TOPICAL
  Filled 2018-02-03: qty 30

## 2018-02-03 MED ORDER — TEMAZEPAM 7.5 MG PO CAPS
7.5000 mg | ORAL_CAPSULE | Freq: Every evening | ORAL | Status: DC | PRN
  Administered 2018-02-03 – 2018-02-04 (×2): 7.5 mg via ORAL
  Filled 2018-02-03 (×2): qty 1

## 2018-02-03 MED ORDER — FAMOTIDINE 20 MG PO TABS
20.0000 mg | ORAL_TABLET | Freq: Two times a day (BID) | ORAL | Status: DC
  Administered 2018-02-03 – 2018-02-06 (×6): 20 mg via ORAL
  Filled 2018-02-03 (×7): qty 1

## 2018-02-03 MED ORDER — KCL IN DEXTROSE-NACL 20-5-0.9 MEQ/L-%-% IV SOLN
INTRAVENOUS | Status: DC
  Administered 2018-02-03 – 2018-02-04 (×2): via INTRAVENOUS
  Administered 2018-02-05: 1000 mL via INTRAVENOUS
  Filled 2018-02-03 (×4): qty 1000

## 2018-02-03 MED ORDER — METHOCARBAMOL 500 MG PO TABS
500.0000 mg | ORAL_TABLET | Freq: Four times a day (QID) | ORAL | Status: DC | PRN
  Administered 2018-02-03 – 2018-02-06 (×7): 500 mg via ORAL
  Filled 2018-02-03 (×7): qty 1

## 2018-02-03 MED ORDER — HEPARIN SODIUM (PORCINE) 5000 UNIT/ML IJ SOLN
5000.0000 [IU] | Freq: Three times a day (TID) | INTRAMUSCULAR | Status: DC
  Administered 2018-02-04 – 2018-02-06 (×5): 5000 [IU] via SUBCUTANEOUS
  Filled 2018-02-03 (×5): qty 1

## 2018-02-03 MED ORDER — PROPOFOL 10 MG/ML IV BOLUS
INTRAVENOUS | Status: DC | PRN
  Administered 2018-02-03: 100 mg via INTRAVENOUS

## 2018-02-03 MED ORDER — LIDOCAINE 2% (20 MG/ML) 5 ML SYRINGE
INTRAMUSCULAR | Status: DC | PRN
  Administered 2018-02-03: 100 mg via INTRAVENOUS

## 2018-02-03 MED ORDER — MIDAZOLAM HCL 2 MG/2ML IJ SOLN
INTRAMUSCULAR | Status: AC
  Filled 2018-02-03: qty 2

## 2018-02-03 MED ORDER — ACETAMINOPHEN 325 MG PO TABS: 650.0000 mg | ORAL_TABLET | Freq: Four times a day (QID) | ORAL | Status: DC | PRN

## 2018-02-03 MED ORDER — SUGAMMADEX SODIUM 200 MG/2ML IV SOLN
INTRAVENOUS | Status: DC | PRN
  Administered 2018-02-03: 200 mg via INTRAVENOUS

## 2018-02-03 MED ORDER — INDOMETHACIN 50 MG RE SUPP
RECTAL | Status: AC
  Filled 2018-02-03: qty 2

## 2018-02-03 MED ORDER — DOCUSATE SODIUM 100 MG PO CAPS
100.0000 mg | ORAL_CAPSULE | Freq: Every day | ORAL | Status: DC
  Administered 2018-02-04 – 2018-02-06 (×3): 100 mg via ORAL
  Filled 2018-02-03 (×3): qty 1

## 2018-02-03 MED ORDER — DEXAMETHASONE SODIUM PHOSPHATE 10 MG/ML IJ SOLN
INTRAMUSCULAR | Status: DC | PRN
  Administered 2018-02-03: 10 mg via INTRAVENOUS

## 2018-02-03 MED ORDER — ONDANSETRON 4 MG PO TBDP: 4.0000 mg | ORAL_TABLET | Freq: Three times a day (TID) | ORAL | Status: DC | PRN

## 2018-02-03 MED ORDER — SODIUM CHLORIDE 0.9 % IV SOLN
INTRAVENOUS | Status: DC | PRN
  Administered 2018-02-03: 20 mL

## 2018-02-03 MED ORDER — HYDROMORPHONE HCL 1 MG/ML IJ SOLN
0.5000 mg | INTRAMUSCULAR | Status: DC | PRN
Start: 2018-02-03 — End: 2018-02-05
  Administered 2018-02-03 – 2018-02-05 (×13): 1 mg via INTRAVENOUS
  Filled 2018-02-03 (×13): qty 1

## 2018-02-03 MED ORDER — CIPROFLOXACIN IN D5W 400 MG/200ML IV SOLN
400.0000 mg | Freq: Two times a day (BID) | INTRAVENOUS | Status: DC
  Administered 2018-02-03 – 2018-02-04 (×3): 400 mg via INTRAVENOUS
  Filled 2018-02-03 (×2): qty 200

## 2018-02-03 MED ORDER — FENTANYL CITRATE (PF) 100 MCG/2ML IJ SOLN
INTRAMUSCULAR | Status: AC
  Filled 2018-02-03: qty 2

## 2018-02-03 MED ORDER — ALBUTEROL SULFATE (2.5 MG/3ML) 0.083% IN NEBU: 2.5000 mg | INHALATION_SOLUTION | Freq: Four times a day (QID) | RESPIRATORY_TRACT | Status: DC | PRN

## 2018-02-03 NOTE — Transfer of Care (Signed)
Immediate Anesthesia Transfer of Care Note  Patient: Austin Barnes  Procedure(s) Performed: ENDOSCOPIC RETROGRADE CHOLANGIOPANCREATOGRAPHY (ERCP) (N/A ) SPHINCTEROTOMY  Patient Location: PACU  Anesthesia Type:General  Level of Consciousness: sedated  Airway & Oxygen Therapy: Patient Spontanous Breathing and Patient connected to face mask oxygen  Post-op Assessment: Report given to RN and Post -op Vital signs reviewed and stable  Post vital signs: Reviewed and stable  Last Vitals:  Vitals Value Taken Time  BP 192/81 02/03/2018 12:45 PM  Temp    Pulse 82 02/03/2018 12:46 PM  Resp 19 02/03/2018 12:46 PM  SpO2 100 % 02/03/2018 12:46 PM  Vitals shown include unvalidated device data.  Last Pain:  Vitals:   02/03/18 1049  TempSrc: Oral  PainSc: 8       Patients Stated Pain Goal: 2 (02/03/18 0756)  Complications: No apparent anesthesia complications

## 2018-02-03 NOTE — Plan of Care (Signed)
Plan of care discussed with patient 

## 2018-02-03 NOTE — Interval H&P Note (Signed)
History and Physical Interval Note:  02/03/2018 11:11 AM  Austin Barnes  has presented today for surgery, with the diagnosis of CBD stone  The various methods of treatment have been discussed with the patient and family. After consideration of risks, benefits and other options for treatment, the patient has consented to  Procedure(s): ENDOSCOPIC RETROGRADE CHOLANGIOPANCREATOGRAPHY (ERCP) (N/A) as a surgical intervention .  The patient's history has been reviewed, patient examined, no change in status, stable for surgery.  I have reviewed the patient's chart and labs.  Questions were answered to the patient's satisfaction.     Venita LickMalcolm T. Russella DarStark

## 2018-02-03 NOTE — Op Note (Signed)
[facility] Patient Name: Austin Barnes Procedure Date: 02/03/2018 MRN: [id number] Attending MD: Meryl Dare , MD Date of Birth: 10/09/1950 CSN: [id number] Age: 67 Admit Type: Inpatient Procedure:                ERCP Indications:              Bile duct stone(s) on MRCP, Elevated liver enzymes,                            Biliary type pain Providers:                Venita Lick. Russella Dar, MD, Priscella Mann RN, RN,                            Beryle Beams, Technician, Doreene Burke, CRNA Referring MD:             [facility] Medicines:                General Anesthesia Complications:            No immediate complications. Estimated Blood Loss:     Estimated blood loss: none. Procedure:                Pre-Anesthesia Assessment:                           - Prior to the procedure, a History and Physical                            was performed, and patient medications and                            allergies were reviewed. The patient's tolerance of                            previous anesthesia was also reviewed. The risks                            and benefits of the procedure and the sedation                            options and risks were discussed with the patient.                            All questions were answered, and informed consent                            was obtained. Prior Anticoagulants: The patient has                            taken no previous anticoagulant or antiplatelet                            agents. ASA Grade Assessment: III - A patient with  severe systemic disease. After reviewing the risks                            and benefits, the patient was deemed in                            satisfactory condition to undergo the procedure.                           After obtaining informed consent, the scope was                            passed under direct vision. Throughout the   procedure, the patient's blood pressure, pulse, and                            oxygen saturations were monitored continuously. The                            endoscope was introduced through the mouth, and                            used to inject contrast into and used to inject                            contrast into the bile duct. The [id number] ) was introduced through the mouth, and                            used to inject contrast into and used to inject                            contrast into the bile duct. The ERCP was somewhat                            difficult due to challenging cannulation because of                            abnormal anatomy, gastrojejunostomy, and major                            ampulla access via a retrograde approach in the                            afferent limb. Scope In: Scope Out: Findings:      The scout film was normal. The esophagus was successfully intubated       under direct vision with a standard endoscope. The scope was advanced to       a normal major papilla in the descending duodenum without detailed       examination of the pharynx, larynx and associated structures, and upper  GI tract. The minor ampulla was identified and appeared normal. A normal       appearing gastrojejunostomy was found. Both the efferent and afferent       limbs were entered and appeared unremarkable. The upper GI tract was       otherwise grossly normal. A straight Roadrunner wire was passed into the       biliary tree. The short-nosed traction sphincterotome was then passed       over the guidewire and contrast injected. I personally interpreted the       bile duct images. There was appropriate flow of contrast through the       ducts. The middle third of the main bile duct contained 2 small filling       defects thought to be stones. The common bile duct, common hepatic duct,       left main hepatic duct and right  main hepatic duct were diffusely       dilated, acquired. The largest diameter was 10 mm. The channel of the       standard endoscope would not accommodate the sphincterotome without       resistance although they should be compatible. The standard endoscope       and wire were removed. A standard duodenoscope (larger channel) was       inserted. The CBD was cannulated with a guidewire, then the Bilroth II       sphincterotome was passed over the guidewire and the bile duct was       deeply cannulated. A 7 mm biliary sphincterotomy was made with a       Billroth II sphincterotome using ERBE electrocautery. There was no       post-sphincterotomy bleeding. The biliary tree was swept with a 9 - 12       mm balloon starting at the bifurcation 3 times. Nothing was found       however no filling defects were noted on the final occlusion       cholangiogram. The PD was not cannulated or injected by intention. Impression:               - Prior gastrojejunostomy.                           - Two filling defect consistent with stones noted                            on the cholangiogram, suspicious for                            choledocholithiasis.                           - Biliary tree dilation noted.                           - A biliary sphincterotomy was performed with a                            Bilroth II sphincterotome.                           - The biliary tree was swept  and nothing was found                            however no filling defects noted on final                            cholangiogram. Moderate Sedation:      N/A- Per Anesthesia Care Recommendation:           - Return patient to hospital ward for ongoing care.                           - Avoid aspirin and nonsteroidal anti-inflammatory                            medicines for 1 week.                           - Trend LFTs.                           - Surgery following with plans for cholecystectomy. Procedure Code(s):         --- Professional ---                           [id number], Endoscopic retrograde                            cholangiopancreatography (ERCP); with                            sphincterotomy/papillotomy Diagnosis Code(s):        --- Professional ---                           R93.2, Abnormal findings on diagnostic imaging of                            liver and biliary tract                           K80.50, Calculus of bile duct without cholangitis                            or cholecystitis without obstruction                           R74.8, Abnormal levels of other serum enzymes                           K83.8, Other specified diseases of biliary tract CPT copyright 2017 American [employer]. All rights reserved. The codes documented in this report are preliminary and upon coder review may  be revised to meet current compliance requirements. Meryl DareMalcolm T Stark, MD 02/03/2018 1:01:45 PM This report has been signed electronically. Number of Addenda: 0

## 2018-02-03 NOTE — Anesthesia Preprocedure Evaluation (Addendum)
Anesthesia Evaluation  Patient identified by MRN, date of birth, ID band Patient awake    Reviewed: Allergy & Precautions, NPO status , Patient's Chart, lab work & pertinent test results  Airway Mallampati: II  TM Distance: >3 FB Neck ROM: Full    Dental no notable dental hx.    Pulmonary Current Smoker,    Pulmonary exam normal breath sounds clear to auscultation       Cardiovascular hypertension, Normal cardiovascular exam Rhythm:Regular Rate:Normal     Neuro/Psych Chronic methadone use negative psych ROS   GI/Hepatic Neg liver ROS, GERD  ,  Endo/Other  diabetesHypothyroidism Acute cholecystitis  Renal/GU negative Renal ROS  negative genitourinary   Musculoskeletal negative musculoskeletal ROS (+)   Abdominal   Peds negative pediatric ROS (+)  Hematology  (+) anemia ,   Anesthesia Other Findings   Reproductive/Obstetrics negative OB ROS                             Anesthesia Physical Anesthesia Plan  ASA: III  Anesthesia Plan: General   Post-op Pain Management:    Induction: Intravenous  PONV Risk Score and Plan: 1 and Ondansetron and Dexamethasone  Airway Management Planned: Oral ETT  Additional Equipment:   Intra-op Plan:   Post-operative Plan: Extubation in OR  Informed Consent: I have reviewed the patients History and Physical, chart, labs and discussed the procedure including the risks, benefits and alternatives for the proposed anesthesia with the patient or authorized representative who has indicated his/her understanding and acceptance.   Dental advisory given  Plan Discussed with: CRNA  Anesthesia Plan Comments:         Anesthesia Quick Evaluation

## 2018-02-03 NOTE — Anesthesia Procedure Notes (Signed)
Procedure Name: Intubation Date/Time: 02/03/2018 11:33 AM Performed by: Talbot Grumbling, CRNA Pre-anesthesia Checklist: Patient identified, Emergency Drugs available, Suction available and Patient being monitored Patient Re-evaluated:Patient Re-evaluated prior to induction Oxygen Delivery Method: Circle system utilized Preoxygenation: Pre-oxygenation with 100% oxygen Induction Type: IV induction Ventilation: Mask ventilation without difficulty Laryngoscope Size: Mac and 3 Grade View: Grade I Tube type: Oral Tube size: 7.5 mm Number of attempts: 1 Airway Equipment and Method: Stylet Placement Confirmation: ETT inserted through vocal cords under direct vision,  positive ETCO2 and breath sounds checked- equal and bilateral Secured at: 22 cm Tube secured with: Tape Dental Injury: Teeth and Oropharynx as per pre-operative assessment

## 2018-02-03 NOTE — Anesthesia Postprocedure Evaluation (Signed)
Anesthesia Post Note  Patient: Austin Barnes  Procedure(s) Performed: ENDOSCOPIC RETROGRADE CHOLANGIOPANCREATOGRAPHY (ERCP) (N/A ) SPHINCTEROTOMY     Patient location during evaluation: PACU Anesthesia Type: General Level of consciousness: awake and alert Pain management: pain level controlled Vital Signs Assessment: post-procedure vital signs reviewed and stable Respiratory status: spontaneous breathing, nonlabored ventilation, respiratory function stable and patient connected to nasal cannula oxygen Cardiovascular status: blood pressure returned to baseline and stable Postop Assessment: no apparent nausea or vomiting Anesthetic complications: no    Last Vitals:  Vitals:   02/03/18 1305 02/03/18 1310  BP:  (!) 142/82  Pulse:  61  Resp:  12  Temp:    SpO2: 96% 96%    Last Pain:  Vitals:   02/03/18 1330  TempSrc:   PainSc: 8                  Segundo Makela S

## 2018-02-03 NOTE — Progress Notes (Signed)
[facility]/Trauma Progress Note      Assessment/Plan Diet controlled Type II diabetes - pt does not check blood glucose at home DDD - home baclofen Hypothyroidism - home synthroid PVD HTN Hx of DVT Hiatal hernia PUD  Previous abdominal surgery for peptic ulcer disease, unsure of actual procedure - PCP, Dr. Jose Persia has old op note  Cholecystitis with possible CBD stone - GI is okay to proceed with EGD and if they feel comfortable then ERCP. If they are not comfortable with ERCP based on pts anatomy then they recommend transferring pt to tertiary center for ERCP - Tbili WNL today, LFT's & Alk phos trending down  FEN: NPO VTE: SCD's, heparin ID: none currently Foley: none Follow up: TBD  DISPO: GI to perform EGD today with possible ERCP, am labs   LOS: 2 days    Subjective: CC: cholecystitis with possible CBD stone  Mild abdominal pain. No nausea or vomiting. No issues overnight. Pt states he would like his regular dose of Relistor 29m. No new complaints.   Objective: Vital signs in last 24 hours: Temp:  [98.3 F (36.8 C)-99 F (37.2 C)] 98.5 F (36.9 C) (07/22 0635) Pulse Rate:  [48-62] 62 (07/22 0635) Resp:  [16-20] 18 (07/22 0635) BP: (123-160)/(79-83) 132/80 (07/22 0635) SpO2:  [96 %-99 %] 98 % (07/22 0635) Last BM Date: 01/31/18  Intake/Output from previous day: 07/21 0701 - 07/22 0700 In: 2690 [P.O.:190; I.V.:2400; IV Piggyback:100] Out: 1750 [Urine:1750] Intake/Output this shift: No intake/output data recorded.  PE: Gen:  Alert, NAD, pleasant, cooperative Card:  RRR, no M/G/R heard Pulm:  CTA, no W/R/R, effort normal Abd: Soft, ND, +BS, no HSM, mild generalized TTP with worse TTP in RUQ. No guarding. Large midline scar noted.  Skin: no rashes noted, warm and dry   Anti-infectives: Anti-infectives (From admission, onward)   None      Lab Results:  Recent Labs    02/02/18 0450  WBC 6.6  HGB 11.9*  HCT 38.3*  PLT 307    BMET Recent Labs    02/02/18 0450  NA 141  K 4.2  CL 104  CO2 30  GLUCOSE 112*  BUN 15  CREATININE 0.99  CALCIUM 9.3   PT/INR Recent Labs    02/01/18 0400  LABPROT 13.2  INR 1.01   CMP     Component Value Date/Time   NA 141 02/02/2018 0450   NA 138 12/14/2015 1614   K 4.2 02/02/2018 0450   CL 104 02/02/2018 0450   CO2 30 02/02/2018 0450   GLUCOSE 112 (H) 02/02/2018 0450   BUN 15 02/02/2018 0450   BUN 16 12/14/2015 1614   CREATININE 0.99 02/02/2018 0450   CREATININE 0.97 06/13/2017 1530   CALCIUM 9.3 02/02/2018 0450   PROT 6.9 02/02/2018 0450   PROT 7.0 12/14/2015 1614   ALBUMIN 3.6 02/02/2018 0450   ALBUMIN 4.7 12/14/2015 1614   AST 597 (H) 02/02/2018 0450   ALT 931 (H) 02/02/2018 0450   ALKPHOS 247 (H) 02/02/2018 0450   BILITOT 1.7 (H) 02/02/2018 0450   BILITOT 0.2 12/14/2015 1614   GFRNONAA >60 02/02/2018 0450   GFRNONAA 82 06/13/2017 1530   GFRAA >60 02/02/2018 0450   GFRAA 95 06/13/2017 1530   Lipase     Component Value Date/Time   LIPASE 44 01/31/2018 0100    Studies/Results: Mr 3d Recon At Scanner  Result Date: 02/01/2018 CLINICAL DATA:  Abnormal liver function tests. Thickened gallbladder wall with sludge. Elevated LFTs.  EXAM: MRI ABDOMEN WITHOUT AND WITH CONTRAST (INCLUDING MRCP) TECHNIQUE: Multiplanar multisequence MR imaging of the abdomen was performed both before and after the administration of intravenous contrast. Heavily T2-weighted images of the biliary and pancreatic ducts were obtained, and three-dimensional MRCP images were rendered by post processing. CONTRAST:  107m MULTIHANCE GADOBENATE DIMEGLUMINE 529 MG/ML IV SOLN COMPARISON:  Ultrasound 02/01/2018, CT 07/21/2017 FINDINGS: Lower chest:  Lung bases are clear. Hepatobiliary: There is mild gallbladder wall thickening 4 mm. No gallstones identified. The common bile duct common hepatic duct are prominent measure up to 8 mm which is mildly dilated but this is similar to comparison CT  07/21/2017. There is a 2-3 mm filling defect in the distal common bile duct seen on several series (image 36/8, image 35/3.) This may represent small distal common bile duct stone however the is not conclusive and the exam is limited by respiratory motion. No intrahepatic duct dilatation. Pancreas: No pancreatic inflammation. Pancreatic duct is normal caliber. No peripancreatic fluid collections. The T1 weighted images are degraded by respiratory motion Spleen: Normal spleen. Adrenals/urinary tract: Adrenal glands and kidneys are normal. Stomach/Bowel: Stomach and limited of the small bowel is unremarkable Vascular/Lymphatic: Abdominal aortic normal caliber. No retroperitoneal periportal lymphadenopathy. Musculoskeletal: No aggressive osseous lesion IMPRESSION: 1. Mild gallbladder wall thickening small amount pericholecystic fluid. No gallstones identified. 2. Mild common hepatic duct and common bile duct dilatation which is similar to comparison CT. Small 2-3 mm filling defect within the distal common bile duct does not appear occlusive but could represent small distal stone. Exam is limited by respiratory motion. 3. Small of fluid surrounds the liver. 4. No evidence pancreatitis. Electronically Signed   By: SSuzy BouchardM.D.   On: 02/01/2018 12:24   Mr Abdomen Mrcp WMoise BoringContast  Result Date: 02/01/2018 CLINICAL DATA:  Abnormal liver function tests. Thickened gallbladder wall with sludge. Elevated LFTs. EXAM: MRI ABDOMEN WITHOUT AND WITH CONTRAST (INCLUDING MRCP) TECHNIQUE: Multiplanar multisequence MR imaging of the abdomen was performed both before and after the administration of intravenous contrast. Heavily T2-weighted images of the biliary and pancreatic ducts were obtained, and three-dimensional MRCP images were rendered by post processing. CONTRAST:  141mMULTIHANCE GADOBENATE DIMEGLUMINE 529 MG/ML IV SOLN COMPARISON:  Ultrasound 02/01/2018, CT 07/21/2017 FINDINGS: Lower chest:  Lung bases are clear.  Hepatobiliary: There is mild gallbladder wall thickening 4 mm. No gallstones identified. The common bile duct common hepatic duct are prominent measure up to 8 mm which is mildly dilated but this is similar to comparison CT 07/21/2017. There is a 2-3 mm filling defect in the distal common bile duct seen on several series (image 36/8, image 35/3.) This may represent small distal common bile duct stone however the is not conclusive and the exam is limited by respiratory motion. No intrahepatic duct dilatation. Pancreas: No pancreatic inflammation. Pancreatic duct is normal caliber. No peripancreatic fluid collections. The T1 weighted images are degraded by respiratory motion Spleen: Normal spleen. Adrenals/urinary tract: Adrenal glands and kidneys are normal. Stomach/Bowel: Stomach and limited of the small bowel is unremarkable Vascular/Lymphatic: Abdominal aortic normal caliber. No retroperitoneal periportal lymphadenopathy. Musculoskeletal: No aggressive osseous lesion IMPRESSION: 1. Mild gallbladder wall thickening small amount pericholecystic fluid. No gallstones identified. 2. Mild common hepatic duct and common bile duct dilatation which is similar to comparison CT. Small 2-3 mm filling defect within the distal common bile duct does not appear occlusive but could represent small distal stone. Exam is limited by respiratory motion. 3. Small of fluid surrounds the liver.  4. No evidence pancreatitis. Electronically Signed   By: Suzy Bouchard M.D.   On: 02/01/2018 12:24      Kalman Drape , [facility] Surgery 02/03/2018, 9:31 AM  Pager: [phone] Mon-Wed, Friday 7:00am-4:30pm Thurs 7am-11:30am  Consults: [phone]

## 2018-02-04 ENCOUNTER — Inpatient Hospital Stay (HOSPITAL_COMMUNITY): Payer: Medicare Other | Admitting: Anesthesiology

## 2018-02-04 ENCOUNTER — Encounter (HOSPITAL_COMMUNITY): Admission: EM | Disposition: A | Discharge status: Home / Self Care | Payer: Self-pay | Source: Home / Self Care

## 2018-02-04 ENCOUNTER — Encounter (HOSPITAL_COMMUNITY): Payer: Self-pay | Admitting: Emergency Medicine

## 2018-02-04 HISTORY — PX: CHOLECYSTECTOMY: SHX55

## 2018-02-04 LAB — COMPREHENSIVE METABOLIC PANEL
ALBUMIN: 3.2 g/dL — AB (ref 3.5–5.0)
ALK PHOS: 171 U/L — AB (ref 38–126)
ALT: 345 U/L — AB (ref 0–44)
AST: 73 U/L — ABNORMAL HIGH (ref 15–41)
Anion gap: 11 (ref 5–15)
BILIRUBIN TOTAL: 0.7 mg/dL (ref 0.3–1.2)
BUN: 12 mg/dL (ref 8–23)
CALCIUM: 9.1 mg/dL (ref 8.9–10.3)
CO2: 21 mmol/L — AB (ref 22–32)
CREATININE: 0.91 mg/dL (ref 0.61–1.24)
Chloride: 105 mmol/L (ref 98–111)
GFR calc Af Amer: >60 mL/min (ref >60)
GFR calc non Af Amer: >60 mL/min (ref >60)
GLUCOSE: 118 mg/dL — AB (ref 70–99)
Potassium: 3.9 mmol/L (ref 3.5–5.1)
SODIUM: 137 mmol/L (ref 135–145)
TOTAL PROTEIN: 6 g/dL — AB (ref 6.5–8.1)

## 2018-02-04 LAB — CBC
HCT: 32.5 % — ABNORMAL LOW (ref 39.0–52.0)
Hemoglobin: 10.5 g/dL — ABNORMAL LOW (ref 13.0–17.0)
MCH: 28.3 pg (ref 26.0–34.0)
MCHC: 32.3 g/dL (ref 30.0–36.0)
MCV: 87.6 fL (ref 78.0–100.0)
PLATELETS: 286 10*3/uL (ref 150–400)
RBC: 3.71 MIL/uL — ABNORMAL LOW (ref 4.22–5.81)
RDW: 14.6 % (ref 11.5–15.5)
WBC: 5.8 10*3/uL (ref 4.0–10.5)

## 2018-02-04 LAB — SURGICAL PCR SCREEN
MRSA, PCR: NEGATIVE
Staphylococcus aureus: NEGATIVE

## 2018-02-04 LAB — GLUCOSE, CAPILLARY
Glucose-Capillary: 98 mg/dL (ref 70–99)
Glucose-Capillary: 130 mg/dL — ABNORMAL HIGH (ref 70–99)

## 2018-02-04 SURGERY — LAPAROSCOPIC CHOLECYSTECTOMY WITH INTRAOPERATIVE CHOLANGIOGRAM
Anesthesia: General | Site: Abdomen

## 2018-02-04 MED ORDER — LABETALOL HCL 5 MG/ML IV SOLN
INTRAVENOUS | Status: DC | PRN
  Administered 2018-02-04: 5 mg via INTRAVENOUS

## 2018-02-04 MED ORDER — ROCURONIUM BROMIDE 10 MG/ML (PF) SYRINGE
PREFILLED_SYRINGE | INTRAVENOUS | Status: AC
  Filled 2018-02-04: qty 10

## 2018-02-04 MED ORDER — LABETALOL HCL 5 MG/ML IV SOLN
INTRAVENOUS | Status: AC
  Filled 2018-02-04: qty 4

## 2018-02-04 MED ORDER — FENTANYL CITRATE (PF) 100 MCG/2ML IJ SOLN
INTRAMUSCULAR | Status: DC | PRN
  Administered 2018-02-04 (×2): 50 ug via INTRAVENOUS
  Administered 2018-02-04: 100 ug via INTRAVENOUS
  Administered 2018-02-04: 50 ug via INTRAVENOUS

## 2018-02-04 MED ORDER — HYDRALAZINE HCL 20 MG/ML IJ SOLN
INTRAMUSCULAR | Status: DC | PRN
  Administered 2018-02-04: 10 mg via INTRAVENOUS

## 2018-02-04 MED ORDER — FENTANYL CITRATE (PF) 100 MCG/2ML IJ SOLN
25.0000 ug | INTRAMUSCULAR | Status: DC | PRN
  Administered 2018-02-04 (×4): 50 ug via INTRAVENOUS

## 2018-02-04 MED ORDER — PROMETHAZINE HCL 25 MG/ML IJ SOLN: 6.2500 mg | INTRAMUSCULAR | Status: DC | PRN

## 2018-02-04 MED ORDER — LIDOCAINE 2% (20 MG/ML) 5 ML SYRINGE
INTRAMUSCULAR | Status: AC
Start: 2018-02-04 — End: ?
  Filled 2018-02-04: qty 5

## 2018-02-04 MED ORDER — LIDOCAINE 2% (20 MG/ML) 5 ML SYRINGE
INTRAMUSCULAR | Status: DC | PRN
  Administered 2018-02-04: 50 mg via INTRAVENOUS

## 2018-02-04 MED ORDER — ONDANSETRON HCL 4 MG/2ML IJ SOLN
INTRAMUSCULAR | Status: DC | PRN
  Administered 2018-02-04: 4 mg via INTRAVENOUS

## 2018-02-04 MED ORDER — SUGAMMADEX SODIUM 200 MG/2ML IV SOLN
INTRAVENOUS | Status: DC | PRN
  Administered 2018-02-04: 160 mg via INTRAVENOUS

## 2018-02-04 MED ORDER — IOPAMIDOL (ISOVUE-300) INJECTION 61%
INTRAVENOUS | Status: AC
Start: 2018-02-04 — End: ?
  Filled 2018-02-04: qty 50

## 2018-02-04 MED ORDER — FENTANYL CITRATE (PF) 100 MCG/2ML IJ SOLN
INTRAMUSCULAR | Status: AC
  Filled 2018-02-04: qty 4

## 2018-02-04 MED ORDER — HYDRALAZINE HCL 20 MG/ML IJ SOLN
INTRAMUSCULAR | Status: AC
Start: 2018-02-04 — End: ?
  Filled 2018-02-04: qty 1

## 2018-02-04 MED ORDER — LACTATED RINGERS IR SOLN
Status: DC | PRN
  Administered 2018-02-04: 1000 mL

## 2018-02-04 MED ORDER — ONDANSETRON HCL 4 MG/2ML IJ SOLN
INTRAMUSCULAR | Status: AC
  Filled 2018-02-04: qty 2

## 2018-02-04 MED ORDER — PROPOFOL 10 MG/ML IV BOLUS
INTRAVENOUS | Status: AC
  Filled 2018-02-04: qty 20

## 2018-02-04 MED ORDER — PROPOFOL 10 MG/ML IV BOLUS
INTRAVENOUS | Status: DC | PRN
  Administered 2018-02-04: 150 mg via INTRAVENOUS

## 2018-02-04 MED ORDER — BUPIVACAINE HCL (PF) 0.5 % IJ SOLN
INTRAMUSCULAR | Status: DC | PRN
  Administered 2018-02-04: 20 mL

## 2018-02-04 MED ORDER — CIPROFLOXACIN IN D5W 400 MG/200ML IV SOLN: 400.0000 mg | INTRAVENOUS | Status: DC

## 2018-02-04 MED ORDER — FENTANYL CITRATE (PF) 250 MCG/5ML IJ SOLN
INTRAMUSCULAR | Status: AC
  Filled 2018-02-04: qty 5

## 2018-02-04 MED ORDER — GLYCOPYRROLATE PF 0.2 MG/ML IJ SOSY
PREFILLED_SYRINGE | INTRAMUSCULAR | Status: DC | PRN
  Administered 2018-02-04: .2 mg via INTRAVENOUS

## 2018-02-04 MED ORDER — MIDAZOLAM HCL 2 MG/2ML IJ SOLN
INTRAMUSCULAR | Status: AC
  Filled 2018-02-04: qty 2

## 2018-02-04 MED ORDER — BUPIVACAINE HCL (PF) 0.5 % IJ SOLN
INTRAMUSCULAR | Status: AC
  Filled 2018-02-04: qty 30

## 2018-02-04 MED ORDER — OXYCODONE HCL 5 MG PO TABS
5.0000 mg | ORAL_TABLET | ORAL | Status: DC | PRN
  Administered 2018-02-04 – 2018-02-05 (×2): 5 mg via ORAL
  Filled 2018-02-04 (×2): qty 1

## 2018-02-04 MED ORDER — 0.9 % SODIUM CHLORIDE (POUR BTL) OPTIME
TOPICAL | Status: DC | PRN
  Administered 2018-02-04: 1000 mL

## 2018-02-04 MED ORDER — LACTATED RINGERS IV SOLN
INTRAVENOUS | Status: DC
  Administered 2018-02-04: 11:00:00 via INTRAVENOUS

## 2018-02-04 SURGICAL SUPPLY — 27 items
APPLIER CLIP 5 13 M/L LIGAMAX5 (MISCELLANEOUS) ×3
CABLE HIGH FREQUENCY MONO STRZ (ELECTRODE) ×3 IMPLANT
CHLORAPREP W/TINT 26ML (MISCELLANEOUS) ×3 IMPLANT
CLIP APPLIE 5 13 M/L LIGAMAX5 (MISCELLANEOUS) ×1 IMPLANT
COVER MAYO STAND STRL (DRAPES) IMPLANT
DECANTER SPIKE VIAL GLASS SM (MISCELLANEOUS) ×3 IMPLANT
DERMABOND ADVANCED (GAUZE/BANDAGES/DRESSINGS) ×2
DERMABOND ADVANCED .7 DNX12 (GAUZE/BANDAGES/DRESSINGS) ×1 IMPLANT
DRAPE C-ARM 42X120 X-RAY (DRAPES) IMPLANT
ELECT REM PT RETURN 15FT ADLT (MISCELLANEOUS) ×3 IMPLANT
GLOVE SURG SIGNA 7.5 PF LTX (GLOVE) ×3 IMPLANT
GOWN STRL REUS W/TWL XL LVL3 (GOWN DISPOSABLE) ×9 IMPLANT
HEMOSTAT SURGICEL 4X8 (HEMOSTASIS) IMPLANT
KIT BASIN OR (CUSTOM PROCEDURE TRAY) ×3 IMPLANT
POUCH SPECIMEN RETRIEVAL 10MM (ENDOMECHANICALS) ×3 IMPLANT
SCISSORS LAP 5X35 DISP (ENDOMECHANICALS) ×3 IMPLANT
SET CHOLANGIOGRAPH MIX (MISCELLANEOUS) IMPLANT
SET IRRIG TUBING LAPAROSCOPIC (IRRIGATION / IRRIGATOR) ×3 IMPLANT
SLEEVE XCEL OPT CAN 5 100 (ENDOMECHANICALS) ×6 IMPLANT
SUT MNCRL AB 4-0 PS2 18 (SUTURE) ×3 IMPLANT
SUT VICRYL 0 UR6 27IN ABS (SUTURE) ×3 IMPLANT
TOWEL OR 17X26 10 PK STRL BLUE (TOWEL DISPOSABLE) ×3 IMPLANT
TOWEL OR NON WOVEN STRL DISP B (DISPOSABLE) IMPLANT
TRAY LAPAROSCOPIC (CUSTOM PROCEDURE TRAY) ×3 IMPLANT
TROCAR BLADELESS OPT 5 100 (ENDOMECHANICALS) ×3 IMPLANT
TROCAR XCEL BLUNT TIP 100MML (ENDOMECHANICALS) ×3 IMPLANT
TUBING INSUF HEATED (TUBING) ×3 IMPLANT

## 2018-02-04 NOTE — Anesthesia Postprocedure Evaluation (Signed)
Anesthesia Post Note  Patient: Teodoro SprayDavid S Silba  Procedure(s) Performed: LAPAROSCOPIC CHOLECYSTECTOMY (N/A Abdomen)     Patient location during evaluation: PACU Anesthesia Type: General Level of consciousness: awake and alert Pain management: pain level controlled Vital Signs Assessment: post-procedure vital signs reviewed and stable Respiratory status: spontaneous breathing, nonlabored ventilation, respiratory function stable and patient connected to nasal cannula oxygen Cardiovascular status: blood pressure returned to baseline and stable Postop Assessment: no apparent nausea or vomiting Anesthetic complications: no    Last Vitals:  Vitals:   02/04/18 1345 02/04/18 1400  BP: 125/70 (!) 141/73  Pulse: (!) 55 67  Resp: 11 11  Temp:    SpO2: 100% 100%    Last Pain:  Vitals:   02/04/18 1326  TempSrc:   PainSc: 9                  Sadie Pickar S

## 2018-02-04 NOTE — Anesthesia Preprocedure Evaluation (Addendum)
Anesthesia Evaluation  Patient identified by MRN, date of birth, ID band Patient awake    Reviewed: Allergy & Precautions, NPO status , Patient's Chart, lab work & pertinent test results  Airway Mallampati: II  TM Distance: >3 FB Neck ROM: Full    Dental no notable dental hx. (+) Poor Dentition, Chipped, Missing,    Pulmonary Current Smoker,    Pulmonary exam normal breath sounds clear to auscultation       Cardiovascular hypertension, Normal cardiovascular exam Rhythm:Regular Rate:Normal     Neuro/Psych Chronic methadone use    GI/Hepatic hiatal hernia, GERD  Medicated,  Endo/Other  diabetesHypothyroidism Acute cholecystitis  Renal/GU      Musculoskeletal  (+) Arthritis , Osteoarthritis,    Abdominal   Peds  Hematology  (+) anemia ,   Anesthesia Other Findings   Reproductive/Obstetrics                            Anesthesia Physical  Anesthesia Plan  ASA: III  Anesthesia Plan: General   Post-op Pain Management:    Induction: Intravenous  PONV Risk Score and Plan: 1 and Ondansetron and Dexamethasone  Airway Management Planned: Oral ETT  Additional Equipment:   Intra-op Plan:   Post-operative Plan: Extubation in OR  Informed Consent: I have reviewed the patients History and Physical, chart, labs and discussed the procedure including the risks, benefits and alternatives for the proposed anesthesia with the patient or authorized representative who has indicated his/her understanding and acceptance.   Dental advisory given  Plan Discussed with: CRNA and Anesthesiologist  Anesthesia Plan Comments:         Anesthesia Quick Evaluation

## 2018-02-04 NOTE — Op Note (Signed)
Laparoscopic Cholecystectomy Procedure Note  Indications: This patient presents with symptomatic gallbladder disease and will undergo laparoscopic cholecystectomy.  He had an ERCP with sphincterotomy yesterday  Pre-operative Diagnosis: cholelithiasis with cholecystitis  Post-operative Diagnosis: Same  Surgeon: Abigail MiyamotoBLACKMAN,Warwick Nick A   Assistants: Meuth PA  Anesthesia: General endotracheal anesthesia  ASA Class: 3  Procedure Details  The patient was seen again in the Holding Room. The risks, benefits, complications, treatment options, and expected outcomes were discussed with the patient. The possibilities of reaction to medication, pulmonary aspiration, perforation of viscus, bleeding, recurrent infection, finding a normal gallbladder, the need for additional procedures, failure to diagnose a condition, the possible need to convert to an open procedure, and creating a complication requiring transfusion or operation were discussed with the patient. The likelihood of improving the patient's symptoms with return to their baseline status is good.  The patient and/or family concurred with the proposed plan, giving informed consent. The site of surgery properly noted. The patient was taken to Operating Room, identified as Austin Barnes and the procedure verified as Laparoscopic Cholecystectomy with Intraoperative Cholangiogram. A Time Out was held and the above information confirmed.  Prior to the induction of general anesthesia, antibiotic prophylaxis was administered. General endotracheal anesthesia was then administered and tolerated well. After the induction, the abdomen was prepped with Chloraprep and draped in sterile fashion. The patient was positioned in the supine position.  Local anesthetic agent was injected into the skin near the umbilicus and an incision made. We dissected down to the abdominal fascia with blunt dissection.  The fascia was incised vertically and we entered the peritoneal  cavity bluntly.  A pursestring suture of 0-Vicryl was placed around the fascial opening.  The Hasson cannula was inserted and secured with the stay suture.  Pneumoperitoneum was then created with CO2 and tolerated well without any adverse changes in the patient's vital signs. A 5-mm port was placed in the subxiphoid position.  Two 5-mm ports were placed in the right upper quadrant. All skin incisions were infiltrated with a local anesthetic agent before making the incision and placing the trocars.   We positioned the patient in reverse Trendelenburg, tilted slightly to the patient's left. There were moderate adhesions in the upper abdomen.  The gallbladder was identified, the fundus grasped and retracted cephalad. Adhesions were lysed bluntly and with the electrocautery where indicated, taking care not to injure any adjacent organs or viscus. The infundibulum was grasped and retracted laterally, exposing the peritoneum overlying the triangle of Calot. This was then divided and exposed in a blunt fashion. The cystic duct was clearly identified and bluntly dissected circumferentially. A critical view of the cystic duct and cystic artery was obtained.  The cystic duct was then ligated with clips and divided. The cystic artery was, dissected free, ligated with clips and divided as well.   The gallbladder was dissected from the liver bed in retrograde fashion with the electrocautery. The gallbladder was removed and placed in an Endocatch sac. The liver bed was irrigated and inspected. Hemostasis was achieved with the electrocautery. Copious irrigation was utilized and was repeatedly aspirated until clear.  The gallbladder and Endocatch sac were then removed through the umbilical port site.  The pursestring suture was used to close the umbilical fascia.    We again inspected the right upper quadrant for hemostasis.  Pneumoperitoneum was released as we removed the trocars.  4-0 Monocryl was used to close the skin.    Skin glue was then applied. The patient  was then extubated and brought to the recovery room in stable condition. Instrument, sponge, and needle counts were correct at closure and at the conclusion of the case.   Findings: Cholecystitis with Cholelithiasis  Estimated Blood Loss: Minimal         Drains: 0         Specimens: Gallbladder           Complications: None; patient tolerated the procedure well.         Disposition: PACU - hemodynamically stable.         Condition: stable

## 2018-02-04 NOTE — Progress Notes (Addendum)
Progress Note   Subjective  Chief Complaint: Choledocholithiasis  Status post ERCP 02/03/2018.  Patient reports feeling much better this morning, continues with his chronic epigastric pain "which I have had since ulcer disease", but no increase, overall he is moving around his room and feels well.  He is aware of plans for cholecystectomy today.  He is very thankful.    Objective   Vital signs in last 24 hours: Temp:  [97.4 F (36.3 C)-98.9 F (37.2 C)] 98.2 F (36.8 C) (07/23 0542) Pulse Rate:  [55-84] 60 (07/23 0542) Resp:  [12-20] 18 (07/23 0542) BP: (130-202)/(72-91) 130/72 (07/23 0542) SpO2:  [95 %-100 %] 100 % (07/23 0542) Weight:  [177 lb (80.3 kg)] 177 lb (80.3 kg) (07/22 1049) Last BM Date: 01/31/18 General:    white male in NAD Heart:  Regular rate and rhythm; no murmurs Lungs: Respirations even and unlabored, lungs CTA bilaterally Abdomen:  Soft, mild epigastric TTP and nondistended. Normal bowel sounds. Extremities:  Without edema. Neurologic:  Alert and oriented,  grossly normal neurologically. Psych:  Cooperative. Normal mood and affect.  Intake/Output from previous day: 07/22 0701 - 07/23 0700 In: 1977.5 [P.O.:480; I.V.:1297.5; IV Piggyback:200] Out: 1600 [Urine:1600]  Lab Results: Recent Labs    02/02/18 0450 02/04/18 0353  WBC 6.6 5.8  HGB 11.9* 10.5*  HCT 38.3* 32.5*  PLT 307 286   BMET Recent Labs    02/02/18 0450 02/03/18 0851 02/04/18 0353  NA 141 140 137  K 4.2 3.7 3.9  CL 104 104 105  CO2 30 30 21*  GLUCOSE 112* 116* 118*  BUN 15 11 12   CREATININE 0.99 0.96 0.91  CALCIUM 9.3 9.4 9.1   LFT Recent Labs    02/04/18 0353  PROT 6.0*  ALBUMIN 3.2*  AST 73*  ALT 345*  ALKPHOS 171*  BILITOT 0.7   Studies/Results: Dg Ercp Biliary & Pancreatic Ducts  Result Date: 02/03/2018 CLINICAL DATA:  67 year old male with a history of choledocholithiasis EXAM: ERCP TECHNIQUE: Multiple spot images obtained with the fluoroscopic device and  submitted for interpretation post-procedure. FLUOROSCOPY TIME:  Fluoroscopy Time: 6 minutes 11 seconds COMPARISON:  MR 02/01/2018 FINDINGS: Multiple intraoperative fluoroscopic spot images during ERCP. Initial image demonstrates endoscope projecting over the upper abdomen. Subsequently there is cannulation of the ampulla and a safety wire in place. Partial opacification of the extrahepatic biliary ductal system. There is then deployment of a retrieval balloon. No large filling defects present on the final image. IMPRESSION: Limited images during ERCP demonstrates treatment of choledocholithiasis with deployment of a retrieval balloon. Please refer to the dictated operative report for full details of intraoperative findings and procedure. Electronically Signed   By: Gilmer MorJaime  Wagner D.O.   On: 02/03/2018 12:50    Assessment / Plan:   Assessment: 1.  Cholecystitis 2.  Choledocholithiasis: Status post ERCP 02/03/2018 with improvement in LFTs overnight  Plan: 1.  Patient is improved today after successful ERCP yesterday.  Plans for cholecystectomy today. 2.  We will sign off.  Awaiting final recommendations from Dr. Russella DarStark.  Thank you for your kind consultation.   LOS: 3 days   Unk LightningJennifer Lynne Lemmon  02/04/2018, 9:19 AM  Pager # [phone]    Attending physician's note   I have taken an interval history, reviewed the chart and examined the patient. I agree with the Advanced Practitioner's note, impression and recommendations. Stable post ERCP, sphincterotomy. LFTs improved. Epigastric pain is at his baseline. Cholecystectomy is planned today. Repeat LFTs in 4-6  weeks with his PCP to document normalization. GI signing off. Outpatient GI follow up with Dr. Erick Blinks as needed.   Claudette Head, MD FACG [phone] office

## 2018-02-04 NOTE — Care Management Important Message (Signed)
Important Message  Patient Details  Name: Austin Barnes MRN: [id number] Date of Birth: [birth year]   Medicare Important Message Given:  Yes    Caren MacadamFuller, Halley Shepheard 02/04/2018, 11:15 AMImportant Message  Patient Details  Name: Austin Barnes MRN: [id number] Date of Birth: [birth year]   Medicare Important Message Given:  Yes    Caren MacadamFuller, Lang Zingg 02/04/2018, 11:14 AMImportant Message  Patient Details  Name: Austin Barnes MRN: [id number] Date of Birth: [birth year]   Medicare Important Message Given:  Yes    Caren MacadamFuller, Anquan Azzarello 02/04/2018, 11:14 AM

## 2018-02-04 NOTE — Transfer of Care (Signed)
Immediate Anesthesia Transfer of Care Note  Patient: Austin SprayDavid S Barnes  Procedure(s) Performed: Procedure(s): LAPAROSCOPIC CHOLECYSTECTOMY (N/A)  Patient Location: PACU  Anesthesia Type:General  Level of Consciousness:  sedated, patient cooperative and responds to stimulation  Airway & Oxygen Therapy:Patient Spontanous Breathing and Patient connected to face mask oxgen  Post-op Assessment:  Report given to PACU RN and Post -op Vital signs reviewed and stable  Post vital signs:  Reviewed and stable  Last Vitals:  Vitals:   02/04/18 0542 02/04/18 1100  BP: 130/72 121/73  Pulse: 60 (!) 54  Resp: 18 18  Temp: 36.8 C 37 C  SpO2: 100% 100%    Complications: No apparent anesthesia complications

## 2018-02-04 NOTE — Anesthesia Procedure Notes (Addendum)
Procedure Name: Intubation Date/Time: 02/04/2018 12:31 PM Performed by: Anne Fu, CRNA Pre-anesthesia Checklist: Patient identified, Emergency Drugs available, Suction available, Patient being monitored and Timeout performed Patient Re-evaluated:Patient Re-evaluated prior to induction Oxygen Delivery Method: Circle system utilized Preoxygenation: Pre-oxygenation with 100% oxygen Induction Type: IV induction Ventilation: Mask ventilation without difficulty Laryngoscope Size: Mac and 4 Grade View: Grade II Tube type: Oral Tube size: 7.5 mm Number of attempts: 1 Airway Equipment and Method: Bougie stylet Placement Confirmation: ETT inserted through vocal cords under direct vision,  positive ETCO2 and breath sounds checked- equal and bilateral Secured at: 19 cm Tube secured with: Tape Dental Injury: Teeth and Oropharynx as per pre-operative assessment  Comments: GRADE II converted to bougie with easy pass, noted bilat lung fields and positive ETCO2

## 2018-02-05 ENCOUNTER — Encounter (HOSPITAL_COMMUNITY): Payer: Self-pay | Admitting: Surgery

## 2018-02-05 ENCOUNTER — Inpatient Hospital Stay (HOSPITAL_COMMUNITY): Payer: Medicare Other

## 2018-02-05 MED ORDER — OXYCODONE HCL 5 MG PO TABS
5.0000 mg | ORAL_TABLET | ORAL | Status: DC | PRN
  Administered 2018-02-05 – 2018-02-06 (×5): 10 mg via ORAL
  Filled 2018-02-05 (×5): qty 2

## 2018-02-05 MED ORDER — HYDROMORPHONE HCL 1 MG/ML IJ SOLN
0.5000 mg | INTRAMUSCULAR | Status: DC | PRN
  Administered 2018-02-05 – 2018-02-06 (×4): 0.5 mg via INTRAVENOUS
  Filled 2018-02-05 (×4): qty 0.5

## 2018-02-05 MED ORDER — TEMAZEPAM 15 MG PO CAPS
15.0000 mg | ORAL_CAPSULE | Freq: Every evening | ORAL | Status: DC | PRN
Start: 2018-02-05 — End: 2018-02-06
  Administered 2018-02-05: 15 mg via ORAL
  Filled 2018-02-05: qty 1

## 2018-02-05 NOTE — Plan of Care (Signed)
Pt alert and oriented, complaints of pain. PT/OT evals pending.  RN will monitor.

## 2018-02-05 NOTE — Discharge Instructions (Signed)

## 2018-02-05 NOTE — Progress Notes (Signed)
Nurse paged me that the pt had tachycardia and SOB close to the end of his PT session today. She stated his O2 stats were 100% on RA.   The pt stated he was more SOB today with ambulation than yesterday. He states every August he gets pneumonia. He states this morning he coughed up some green mucus. He is not SOB at rest. His lungs are CTA b/l and his rate and effort are normal. No fever or chills. Pt is well appearing and NAD. He denies pain or swelling in his calves. His calves are soft, nontender, no erythema or increased warmth.   Will obtain a 2 view chest xray to evaluate his lungs.   Mattie MarlinJessica Guerline Happ, [facility] LPA-C [facility] Pager [phone]
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
## 2018-02-05 NOTE — Discharge Summary (Signed)
[facility] Discharge Summary   Patient ID: Austin SprayDavid S Barnes MRN: [id number] DOB/AGE: 1951/07/03 67 y.o.  Admit date: 02/01/2018 Discharge date: 02/05/2018  Admitting Diagnosis: Acute calculous cholecystitis Transaminitis  Discharge Diagnosis Choledocholithiasis  Consultants Gastroenterology  Imaging: Dg Ercp Biliary & Pancreatic Ducts  Result Date: 02/03/2018 CLINICAL DATA:  67 year old male with a history of choledocholithiasis EXAM: ERCP TECHNIQUE: Multiple spot images obtained with the fluoroscopic device and submitted for interpretation post-procedure. FLUOROSCOPY TIME:  Fluoroscopy Time: 6 minutes 11 seconds COMPARISON:  MR 02/01/2018 FINDINGS: Multiple intraoperative fluoroscopic spot images during ERCP. Initial image demonstrates endoscope projecting over the upper abdomen. Subsequently there is cannulation of the ampulla and a safety wire in place. Partial opacification of the extrahepatic biliary ductal system. There is then deployment of a retrieval balloon. No large filling defects present on the final image. IMPRESSION: Limited images during ERCP demonstrates treatment of choledocholithiasis with deployment of a retrieval balloon. Please refer to the dictated operative report for full details of intraoperative findings and procedure. Electronically Signed   By: Gilmer MorJaime  Wagner D.O.   On: 02/03/2018 12:50    Procedures Dr. Russella DarStark (02/03/18) ERCP Dr. Magnus IvanBlackman (02/04/18) - [facility]   Hospital Course:  Patient is a 67 year old male who [facility].  Workup showed acute calculous cholecystitis with concern for possible choledocholithiasis.  Patient was admitted and GI was consulted. Patient underwent ERCP 7/22 and tolerated procedure well. He underwent laparoscopic cholecystectomy 7/23.  Tolerated procedure well and was transferred to the floor.  Diet was advanced as tolerated.  On POD#1, the patient was voiding well,  tolerating diet, ambulating well, pain well controlled, vital signs stable, incisions c/d/i and felt stable for discharge home.  Patient will follow up in our office in 2 weeks and knows to call with questions or concerns.  Patient was discharged in good condition.  The [place] substance control database was reviewed prior to prescribing narcotic pain medication.   Physical Exam:  Gen:  Alert, NAD, pleasant, cooperative Card:  RRR, no M/G/R heard Pulm:  CTA, no W/R/R, rate and effort normal Abd: Soft, ND, +BS, no HSM, incisions with glue are well appearing and are without drainage or signs of infection, TTP around umbilicus with mild guarding, no signs of peritonitis.  Skin: no rashes noted, warm and dry    Allergies as of 02/06/2018      Reactions   Disalcid [salsalate] Other (See Comments)   Patient has ulcers; can't take   Feldene [piroxicam] Other (See Comments)   Reaction not recalled by the patient   Hctz [hydrochlorothiazide] Other (See Comments)   Possibly caused pancreatitis (??)   Other Nausea And Vomiting   Green peppers   Penicillins Other (See Comments)   Severe welts Has patient had a PCN reaction causing immediate rash, facial/tongue/throat swelling, SOB or lightheadedness with hypotension: Yes Has patient had a PCN reaction causing severe rash involving mucus membranes or skin necrosis: No Has patient had a PCN reaction that required hospitalization: No Has patient had a PCN reaction occurring within the last 10 years: No If all of the above answers are "NO", then may proceed with Cephalosporin use.   Codeine Rash   Demerol Hives, Rash   Sulfa Antibiotics Rash      Medication List    STOP taking these medications   diazepam 2 MG tablet Commonly known as:  VALIUM   HYDROmorphone 8 MG tablet Commonly known as:  DILAUDID  methadone 10 MG tablet Commonly known as:  DOLOPHINE   minoxidil 10 MG tablet Commonly known as:  LONITEN   omeprazole 20 MG  capsule Commonly known as:  PRILOSEC   pregabalin 25 MG capsule Commonly known as:  LYRICA   prochlorperazine 10 MG tablet Commonly known as:  COMPAZINE   sucralfate 1 g tablet Commonly known as:  CARAFATE     TAKE these medications   acetaminophen 325 MG tablet Commonly known as:  TYLENOL Take 2 tablets (650 mg total) by mouth every 6 (six) hours as needed for mild pain, fever or headache.   albuterol 108 (90 Base) MCG/ACT inhaler Commonly known as:  VENTOLIN HFA Use 2 puffs every 6 hours as needed for shortness of breath and wheezing   baclofen 10 MG tablet Commonly known as:  LIORESAL Take one tablet by mouth three times daily as needed for muscle relaxation   diclofenac sodium 1 % Gel Commonly known as:  VOLTAREN Apply 2-4gm to affected area up to 4 times daily   levothyroxine 75 MCG tablet Commonly known as:  SYNTHROID, LEVOTHROID Take one tablet by mouth 30 minutes before breakfast for thyroid   losartan 50 MG tablet Commonly known as:  COZAAR Take 50 mg by mouth daily.   Methylnaltrexone Bromide 150 MG Tabs Commonly known as:  RELISTOR Take 4 tablets by mouth daily.   nitroGLYCERIN 2 % ointment Commonly known as:  NITRO-BID APPLY 1 INCH STRIP TO FEET TWICE DAILY.   oxyCODONE 5 MG immediate release tablet Commonly known as:  Oxy IR/ROXICODONE Take 1 tablet (5 mg total) by mouth every 6 (six) hours as needed (5mg  for moderate pain, 10 mg for severe pain).   temazepam 7.5 MG capsule Commonly known as:  RESTORIL Take one capsule by mouth once daily at bedtime as needed for rest   Testosterone 20.25 MG/ACT (1.62%) Gel Commonly known as:  ANDROGEL PUMP Apply 1 Pump topically every other day AND 2 Pump every other day. On opposite days.       Follow-up Information    [facility]. Go on 02/18/2018.   Specialty:  General Surgery Why:  Your appointment is scheduled for 10:30 AM. Please arrive 30 min prior to appointment time for check in and  paperwork. Bring photo ID and insurance information.  Contact information: [address] [place] [address] [phone]            Signed: Mattie Marlin, [facility] Pager [phone]

## 2018-02-05 NOTE — Progress Notes (Signed)
When working with PT, pt became SOB and HR went up to 125.  O2 sats 98-100 on RA. HR came back down to 104 with rest.  PA Shanda BumpsJessica paged and made aware.  RN will monitor.

## 2018-02-05 NOTE — Addendum Note (Signed)
Addendum  created 02/05/18 0716 by Elyn PeersAllen, [place] Mokry J, CRNA   Charge Capture section accepted

## 2018-02-05 NOTE — Care Management Note (Addendum)
Case Management Note  Patient Details  Name: Austin Barnes MRN: [id number] Date of Birth: August 07, 1950  Subjective/Objective:       Possible first at home client             Action/Plan:  TCT Kandee Keenory with [facility] will review to see if able to join program.[email or web address]-is a good candidate. Expected Discharge Date:                  Expected Discharge Plan:  Home w Home Health Services  In-House Referral:     Discharge planning Services  CM Consult  Post Acute Care Choice:    Choice offered to:  NA  DME Arranged:    DME Agency:     HH Arranged:  RN HH Agency:  [facility]  Status of Service:  Completed, signed off  If discussed at Long Length of Stay Meetings, dates discussed:    Additional Comments:  Golda AcreDavis, Rhonda Lynn, RN 02/05/2018, 3:10 PM

## 2018-02-06 ENCOUNTER — Encounter (HOSPITAL_COMMUNITY): Payer: Self-pay | Admitting: Gastroenterology

## 2018-02-06 MED ORDER — ACETAMINOPHEN 325 MG PO TABS: 650.0000 mg | ORAL_TABLET | Freq: Four times a day (QID) | ORAL | Status: AC | PRN

## 2018-02-06 MED ORDER — MINERAL OIL RE ENEM
1.0000 | ENEMA | Freq: Once | RECTAL | Status: AC
  Administered 2018-02-06: 1 via RECTAL
  Filled 2018-02-06: qty 1

## 2018-02-06 MED ORDER — OXYCODONE HCL 5 MG PO TABS: 5.0000 mg | ORAL_TABLET | Freq: Four times a day (QID) | ORAL | 0 refills | Status: DC | PRN

## 2018-02-06 MED ORDER — FLEET ENEMA 7-19 GM/118ML RE ENEM
1.0000 | ENEMA | Freq: Once | RECTAL | Status: AC
  Administered 2018-02-06: 1 via RECTAL
  Filled 2018-02-06: qty 1

## 2018-02-06 MED ORDER — METHOCARBAMOL 500 MG PO TABS: 500.0000 mg | ORAL_TABLET | Freq: Four times a day (QID) | ORAL | 0 refills | Status: DC | PRN

## 2018-02-06 MED ORDER — OXYCODONE HCL 5 MG PO TABS: 5.0000 mg | ORAL_TABLET | Freq: Four times a day (QID) | ORAL | 0 refills | Status: AC | PRN

## 2018-02-06 NOTE — Evaluation (Signed)
Occupational Therapy Evaluation Patient Details Name: LORN BUTCHER MRN: [id number] DOB: 1951-06-08 Today's Date: 02/06/2018    History of Present Illness Pt is a 67 year old male with reported history of spinal disease and multiple back surgeries, and presents with symptomatic gallbladder disease.  He had an ERCP with sphincterotomy and lap cholecystectomy   Clinical Impression   Pt was admitted for the above.  All education was completed.  Pt has difficulty with home management tasks. He does not want HHOT at home    Follow Up Recommendations  No OT follow up(pt does not want HHOT)    Equipment Recommendations  (rec shower seat; money is an issue)    Recommendations for Other Services       Precautions / Restrictions Precautions Precautions: Fall Restrictions Weight Bearing Restrictions: No      Mobility Bed Mobility Overal bed mobility: Modified Independent                Transfers   Equipment used: Straight cane             General transfer comment: mod I sit to stand    Balance Overall balance assessment: Mild deficits observed, not formally tested                                         ADL either performed or assessed with clinical judgement   ADL Overall ADL's : Needs assistance/impaired                                       General ADL Comments: min guard for safety to gather items. Pt has reachers, AE but tends to work through pain.  Requested to wall in hall after we were finished. Educated on energy conservation; pt will likely do things his way, although he reports that he does take rest breaks.  Home management is where he really struggles. Pt has had a fall in tub and some blackouts, which he believes are past.  Talked to him about a tub seat for increased safety.  Money is a problem for this.     Vision         Perception     Praxis      Pertinent Vitals/Pain Pain Score: 7  Pain Location:  surgical site Pain Descriptors / Indicators: Sore Pain Intervention(s): Limited activity within patient's tolerance;Monitored during session;Repositioned;Patient requesting pain meds-RN notified     Hand Dominance     Extremity/Trunk Assessment Upper Extremity Assessment Upper Extremity Assessment: Overall WFL for tasks assessed           Communication Communication Communication: No difficulties   Cognition Arousal/Alertness: Awake/alert Behavior During Therapy: WFL for tasks assessed/performed Overall Cognitive Status: Within Functional Limits for tasks assessed                                     General Comments       Exercises     Shoulder Instructions      Home Living Family/patient expects to be discharged to:: Private residence Living Arrangements: Alone                 Bathroom Shower/Tub: Chief Strategy Officer: Standard  Home Equipment: Grab bars - toilet;Grab bars - tub/shower;Bedside commode          Prior Functioning/Environment Level of Independence: Independent with assistive device(s)        Comments: difficulty with iadls        OT Problem List:        OT Treatment/Interventions:      OT Goals(Current goals can be found in the care plan section) Acute Rehab OT Goals Patient Stated Goal: home; be able to get house cleaned OT Goal Formulation: All assessment and education complete, DC therapy  OT Frequency:     Barriers to D/C:            Co-evaluation              AM-PAC PT "6 Clicks" Daily Activity     Outcome Measure Help from another person eating meals?: None Help from another person taking care of personal grooming?: A Little Help from another person toileting, which includes using toliet, bedpan, or urinal?: A Little Help from another person bathing (including washing, rinsing, drying)?: A Little Help from another person to put on and taking off regular upper body clothing?: A  Little Help from another person to put on and taking off regular lower body clothing?: A Little 6 Click Score: 19   End of Session Nurse Communication: Patient requests pain meds(asked unit secretary to call)  Activity Tolerance: Patient tolerated treatment well Patient left: in chair;with call bell/phone within reach  OT Visit Diagnosis: Unsteadiness on feet (R26.81)                Time: 1610-9604: 0831-0853 OT Time Calculation (min): 22 min Charges:  OT General Charges $OT Visit: 1 Visit OT Evaluation $OT Eval Low Complexity: 1 Low G-Codes:     BourbonMaryellen Ardra Kuznicki, OTR/L [phone] 02/06/2018  Javon Hupfer 02/06/2018, 9:13 AM

## 2018-02-06 NOTE — Plan of Care (Signed)
Pt alert and oriented, states his pain is better today.  Plan to d/c home per MD order.

## 2018-02-06 NOTE — Progress Notes (Signed)
CSW provided patient with a taxi voucher to his home. Patient reports he does not have anyone that can pick him up.  Nurse and patient states taxi will be a safe transport.  CSW provided taxi voucher to nurse to give to the patient.   Vivi BarrackNicole Chariti Havel, Alexander MtLCSW, MSW Clinical Social Worker  [phone] 02/06/2018  12:12 PM

## 2018-02-06 NOTE — Progress Notes (Signed)
Discharge paperwork discussed with pt and he demonstrated understanding.  Taxi called for pt and voucher provided.  Pt escorted to main lobby by wheelchair in stable condition.

## 2018-02-06 NOTE — Consult Note (Signed)
   [facility] CM Inpatient Consult   02/06/2018  Austin SprayDavid S Barnes 06/28/1951 [id number]   Made aware by Kandee Keenory with [facility] that Austin Barnes will enroll in the [facility] program upon hospital discharge.  Went to bedside to speak with Austin Barnes to make him aware that if he should have transportation concerns or medication issues while on the [facility], [facility] will assist.   Austin Barnes agreeable to this.   Left [facility] brochure at bedside.    Raiford NobleAtika Hall, MSN-Ed, RN,BSN [facility] Care [facility] Hospital Liaison [phone]

## 2018-02-07 DIAGNOSIS — E119 Type 2 diabetes mellitus without complications: Secondary | ICD-10-CM | POA: Diagnosis not present

## 2018-02-07 DIAGNOSIS — Z48815 Encounter for surgical aftercare following surgery on the digestive system: Secondary | ICD-10-CM | POA: Diagnosis not present

## 2018-02-08 DIAGNOSIS — E039 Hypothyroidism, unspecified: Secondary | ICD-10-CM | POA: Diagnosis not present

## 2018-02-08 DIAGNOSIS — E118 Type 2 diabetes mellitus with unspecified complications: Secondary | ICD-10-CM | POA: Diagnosis not present

## 2018-02-08 DIAGNOSIS — R7989 Other specified abnormal findings of blood chemistry: Secondary | ICD-10-CM | POA: Diagnosis not present

## 2018-02-08 DIAGNOSIS — Z0000 Encounter for general adult medical examination without abnormal findings: Secondary | ICD-10-CM | POA: Diagnosis not present

## 2018-02-08 DIAGNOSIS — I10 Essential (primary) hypertension: Secondary | ICD-10-CM | POA: Diagnosis not present

## 2018-02-08 DIAGNOSIS — M792 Neuralgia and neuritis, unspecified: Secondary | ICD-10-CM | POA: Diagnosis not present

## 2018-02-10 ENCOUNTER — Other Ambulatory Visit: Payer: Self-pay | Admitting: *Deleted

## 2018-02-10 DIAGNOSIS — E039 Hypothyroidism, unspecified: Secondary | ICD-10-CM | POA: Diagnosis not present

## 2018-02-10 DIAGNOSIS — I10 Essential (primary) hypertension: Secondary | ICD-10-CM | POA: Diagnosis not present

## 2018-02-10 NOTE — Consult Note (Signed)
Made aware by Kandee Keenory with [facility] that Mr. Benna DunksBarber declined aide services. Therefore, he did not fully enroll in the [facility] program.   Will alert [facility] office to make aware patient did not enroll in the [facility] program after all.   Raiford NobleAtika Crislyn Willbanks, MSN-Ed, RN,BSN [facility] Care [facility] Hospital Liaison [phone]

## 2018-02-11 DIAGNOSIS — Z48815 Encounter for surgical aftercare following surgery on the digestive system: Secondary | ICD-10-CM | POA: Diagnosis not present

## 2018-02-11 DIAGNOSIS — E119 Type 2 diabetes mellitus without complications: Secondary | ICD-10-CM | POA: Diagnosis not present

## 2018-02-12 DIAGNOSIS — E119 Type 2 diabetes mellitus without complications: Secondary | ICD-10-CM | POA: Diagnosis not present

## 2018-02-12 DIAGNOSIS — Z48815 Encounter for surgical aftercare following surgery on the digestive system: Secondary | ICD-10-CM | POA: Diagnosis not present

## 2018-02-14 DIAGNOSIS — Z48815 Encounter for surgical aftercare following surgery on the digestive system: Secondary | ICD-10-CM | POA: Diagnosis not present

## 2018-02-14 DIAGNOSIS — E119 Type 2 diabetes mellitus without complications: Secondary | ICD-10-CM | POA: Diagnosis not present

## 2018-02-18 DIAGNOSIS — E119 Type 2 diabetes mellitus without complications: Secondary | ICD-10-CM | POA: Diagnosis not present

## 2018-02-18 DIAGNOSIS — Z48815 Encounter for surgical aftercare following surgery on the digestive system: Secondary | ICD-10-CM | POA: Diagnosis not present

## 2018-02-21 DIAGNOSIS — E119 Type 2 diabetes mellitus without complications: Secondary | ICD-10-CM | POA: Diagnosis not present

## 2018-02-21 DIAGNOSIS — Z48815 Encounter for surgical aftercare following surgery on the digestive system: Secondary | ICD-10-CM | POA: Diagnosis not present

## 2018-02-24 DIAGNOSIS — E119 Type 2 diabetes mellitus without complications: Secondary | ICD-10-CM | POA: Diagnosis not present

## 2018-02-24 DIAGNOSIS — Z48815 Encounter for surgical aftercare following surgery on the digestive system: Secondary | ICD-10-CM | POA: Diagnosis not present

## 2018-03-05 DIAGNOSIS — Z48815 Encounter for surgical aftercare following surgery on the digestive system: Secondary | ICD-10-CM | POA: Diagnosis not present

## 2018-03-05 DIAGNOSIS — E119 Type 2 diabetes mellitus without complications: Secondary | ICD-10-CM | POA: Diagnosis not present

## 2018-03-22 DIAGNOSIS — M545 Low back pain: Secondary | ICD-10-CM | POA: Diagnosis not present

## 2018-03-22 DIAGNOSIS — G894 Chronic pain syndrome: Secondary | ICD-10-CM | POA: Diagnosis not present

## 2018-03-22 DIAGNOSIS — E1165 Type 2 diabetes mellitus with hyperglycemia: Secondary | ICD-10-CM | POA: Diagnosis not present

## 2018-03-22 DIAGNOSIS — I10 Essential (primary) hypertension: Secondary | ICD-10-CM | POA: Diagnosis not present

## 2018-05-17 DIAGNOSIS — G894 Chronic pain syndrome: Secondary | ICD-10-CM | POA: Diagnosis not present

## 2018-05-17 DIAGNOSIS — E118 Type 2 diabetes mellitus with unspecified complications: Secondary | ICD-10-CM | POA: Diagnosis not present

## 2018-05-17 DIAGNOSIS — M545 Low back pain: Secondary | ICD-10-CM | POA: Diagnosis not present

## 2018-05-17 DIAGNOSIS — I10 Essential (primary) hypertension: Secondary | ICD-10-CM | POA: Diagnosis not present

## 2018-05-17 DIAGNOSIS — I739 Peripheral vascular disease, unspecified: Secondary | ICD-10-CM | POA: Diagnosis not present

## 2018-05-17 DIAGNOSIS — M6281 Muscle weakness (generalized): Secondary | ICD-10-CM | POA: Diagnosis not present

## 2018-08-08 DIAGNOSIS — G894 Chronic pain syndrome: Secondary | ICD-10-CM | POA: Diagnosis not present

## 2018-08-08 DIAGNOSIS — G64 Other disorders of peripheral nervous system: Secondary | ICD-10-CM | POA: Diagnosis not present

## 2018-08-08 DIAGNOSIS — I10 Essential (primary) hypertension: Secondary | ICD-10-CM | POA: Diagnosis not present

## 2018-08-08 DIAGNOSIS — E039 Hypothyroidism, unspecified: Secondary | ICD-10-CM | POA: Diagnosis not present

## 2018-08-08 DIAGNOSIS — E118 Type 2 diabetes mellitus with unspecified complications: Secondary | ICD-10-CM | POA: Diagnosis not present

## 2018-08-14 DIAGNOSIS — E118 Type 2 diabetes mellitus with unspecified complications: Secondary | ICD-10-CM | POA: Diagnosis not present

## 2018-08-14 DIAGNOSIS — I739 Peripheral vascular disease, unspecified: Secondary | ICD-10-CM | POA: Diagnosis not present

## 2018-08-14 DIAGNOSIS — E039 Hypothyroidism, unspecified: Secondary | ICD-10-CM | POA: Diagnosis not present

## 2018-11-05 IMAGING — DX DG WRIST COMPLETE 3+V*L*
4 series · 4 of 4 positions shown · non-contrast
Comparison: None.

CLINICAL DATA: Fall on ice yesterday with left wrist pain. Initial
encounter.

EXAM:
LEFT WRIST - COMPLETE 3+ VIEW

[wrist ap (1 of 2)]
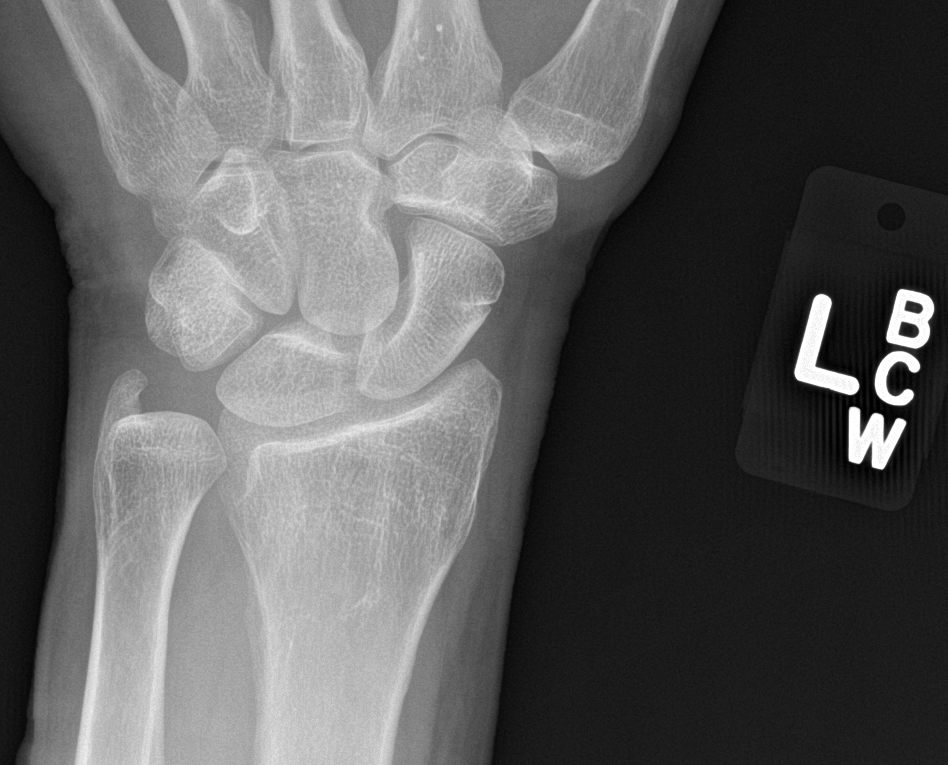

[wrist obl]
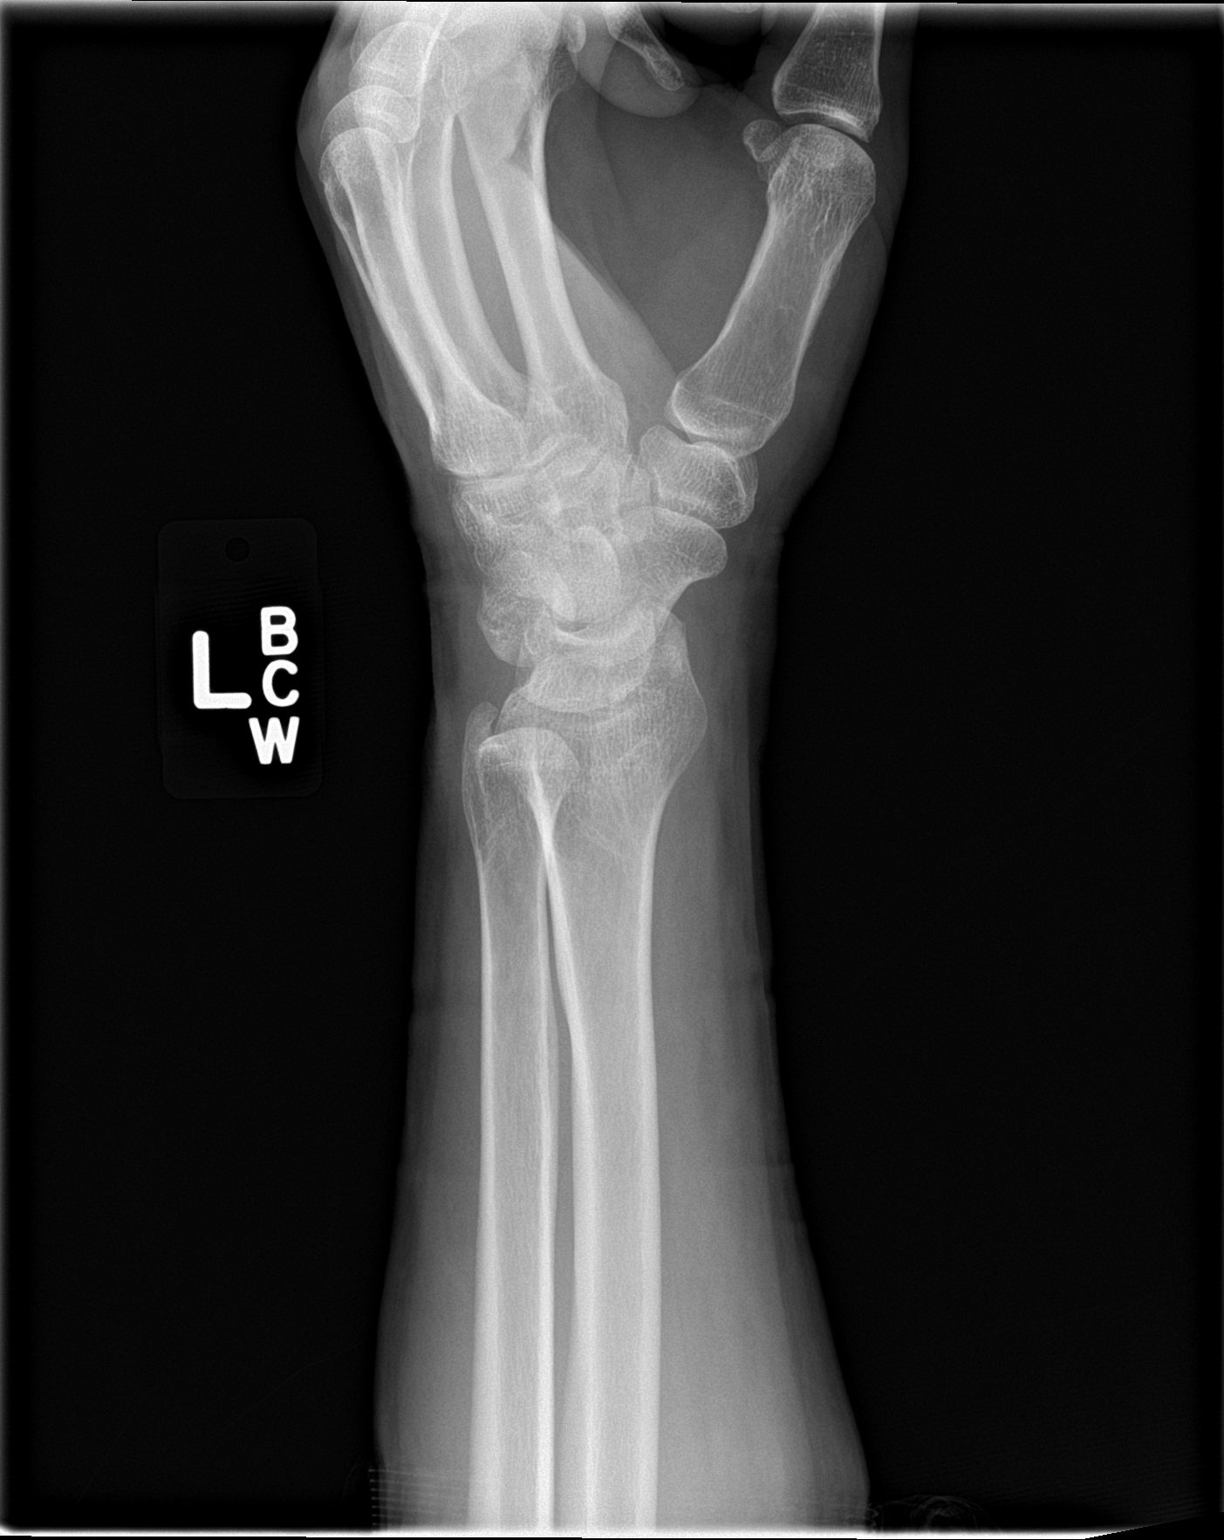

[wrist lat]
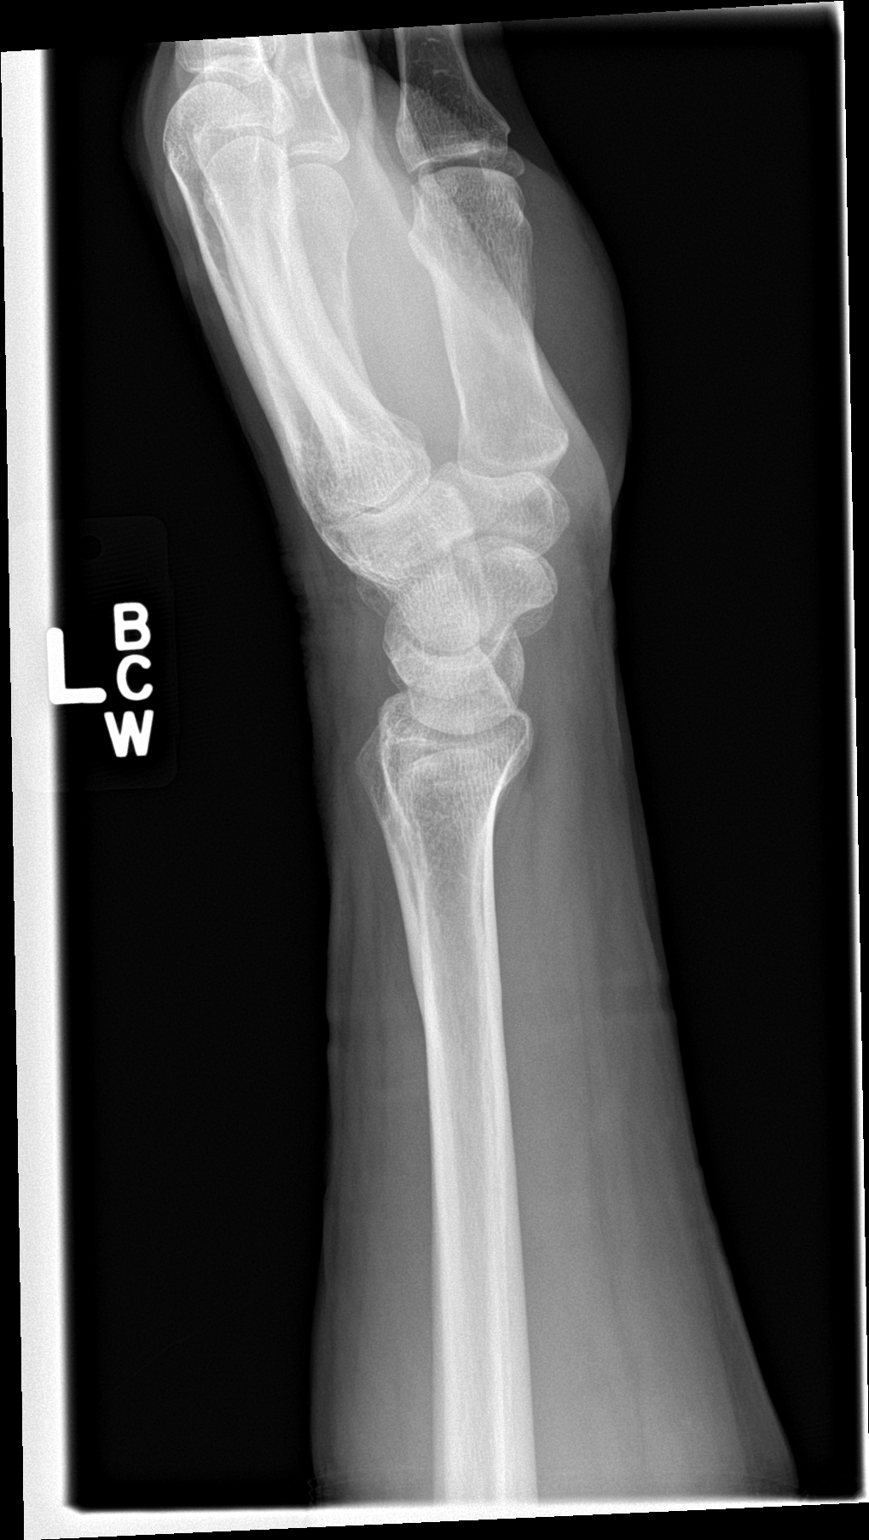

[wrist ap (2 of 2)]
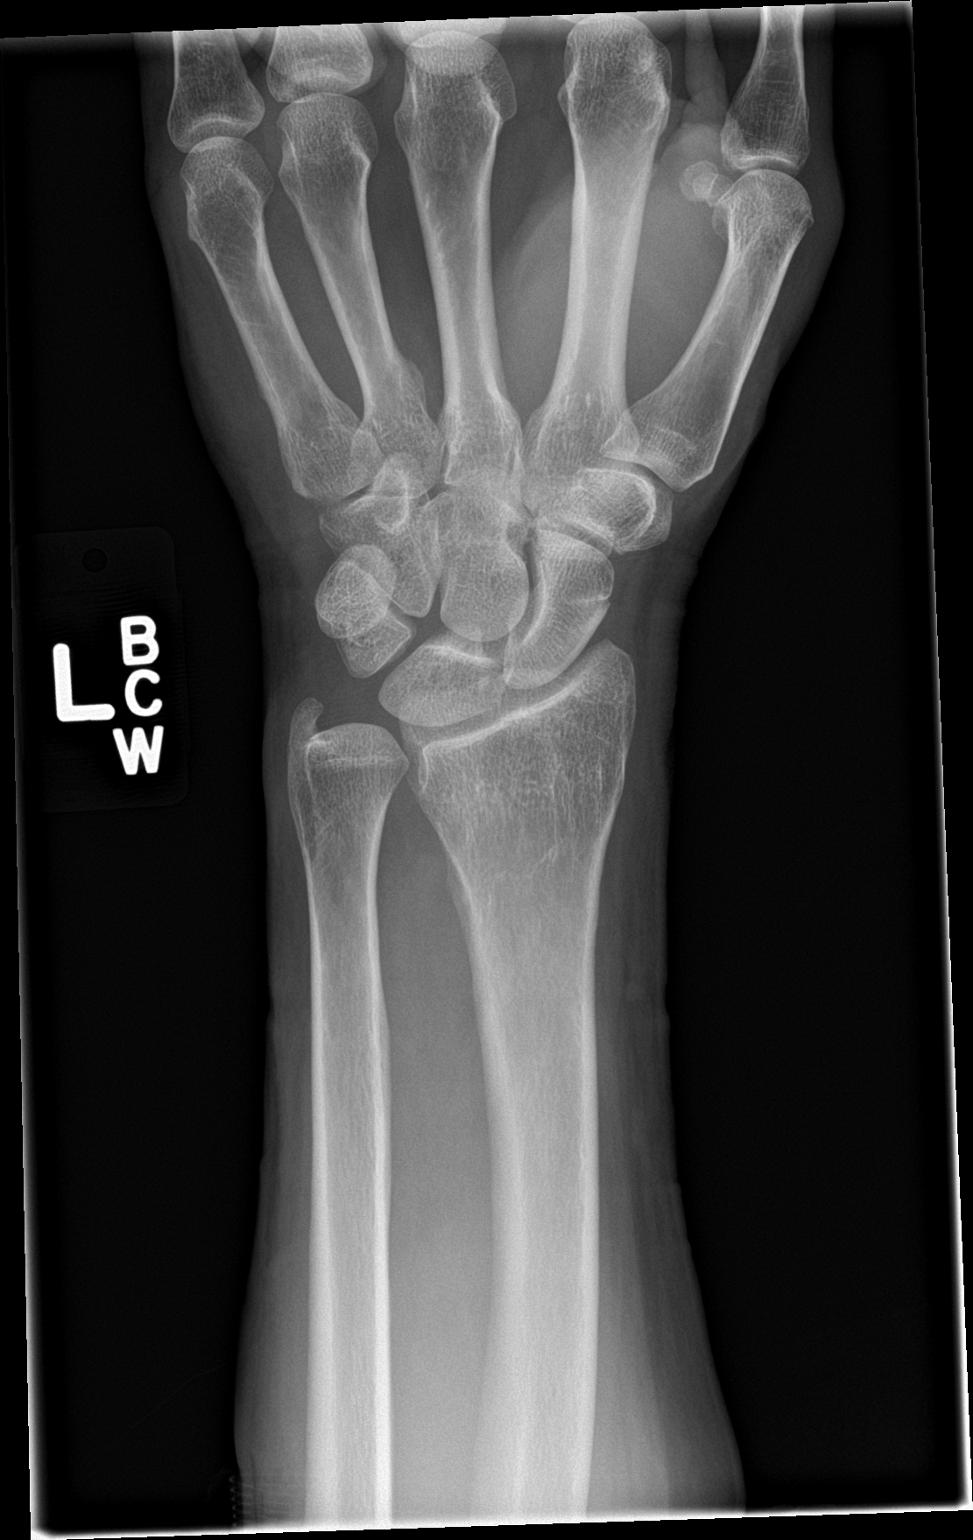

[4 of 4 positions shown; findings below may reference images not displayed]

FINDINGS: There is no evidence of fracture or dislocation. There is no
evidence of arthropathy or other focal bone abnormality. Soft
tissues are unremarkable.
IMPRESSION: Negative.

## 2018-11-08 IMAGING — CR DG CHEST 2V
2 series · 2 of 2 positions shown · non-contrast
Comparison: 07/17/2016

CLINICAL DATA: Community acquired pneumonia.

EXAM:
CHEST  2 VIEW

[w chest pa]
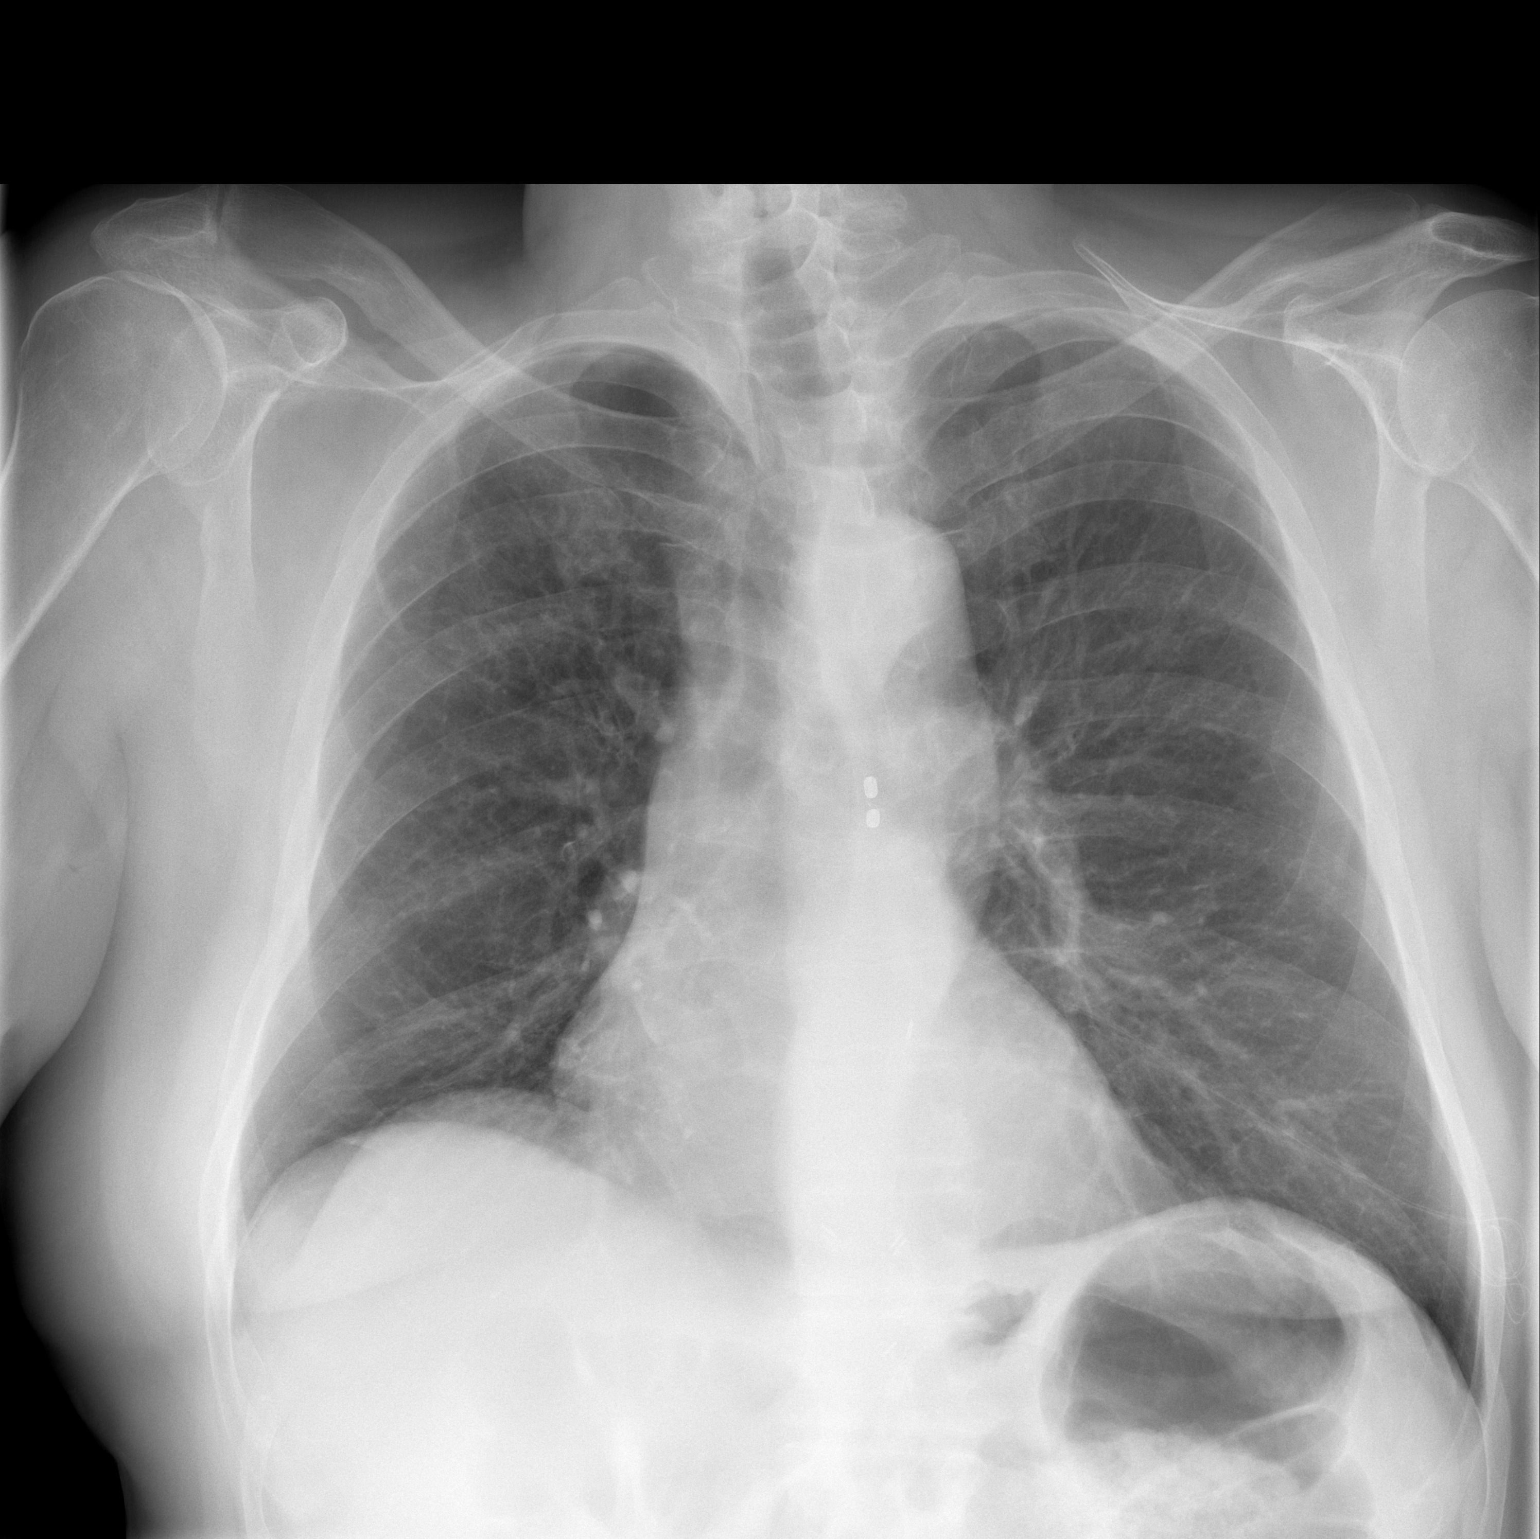

[w chest lat]
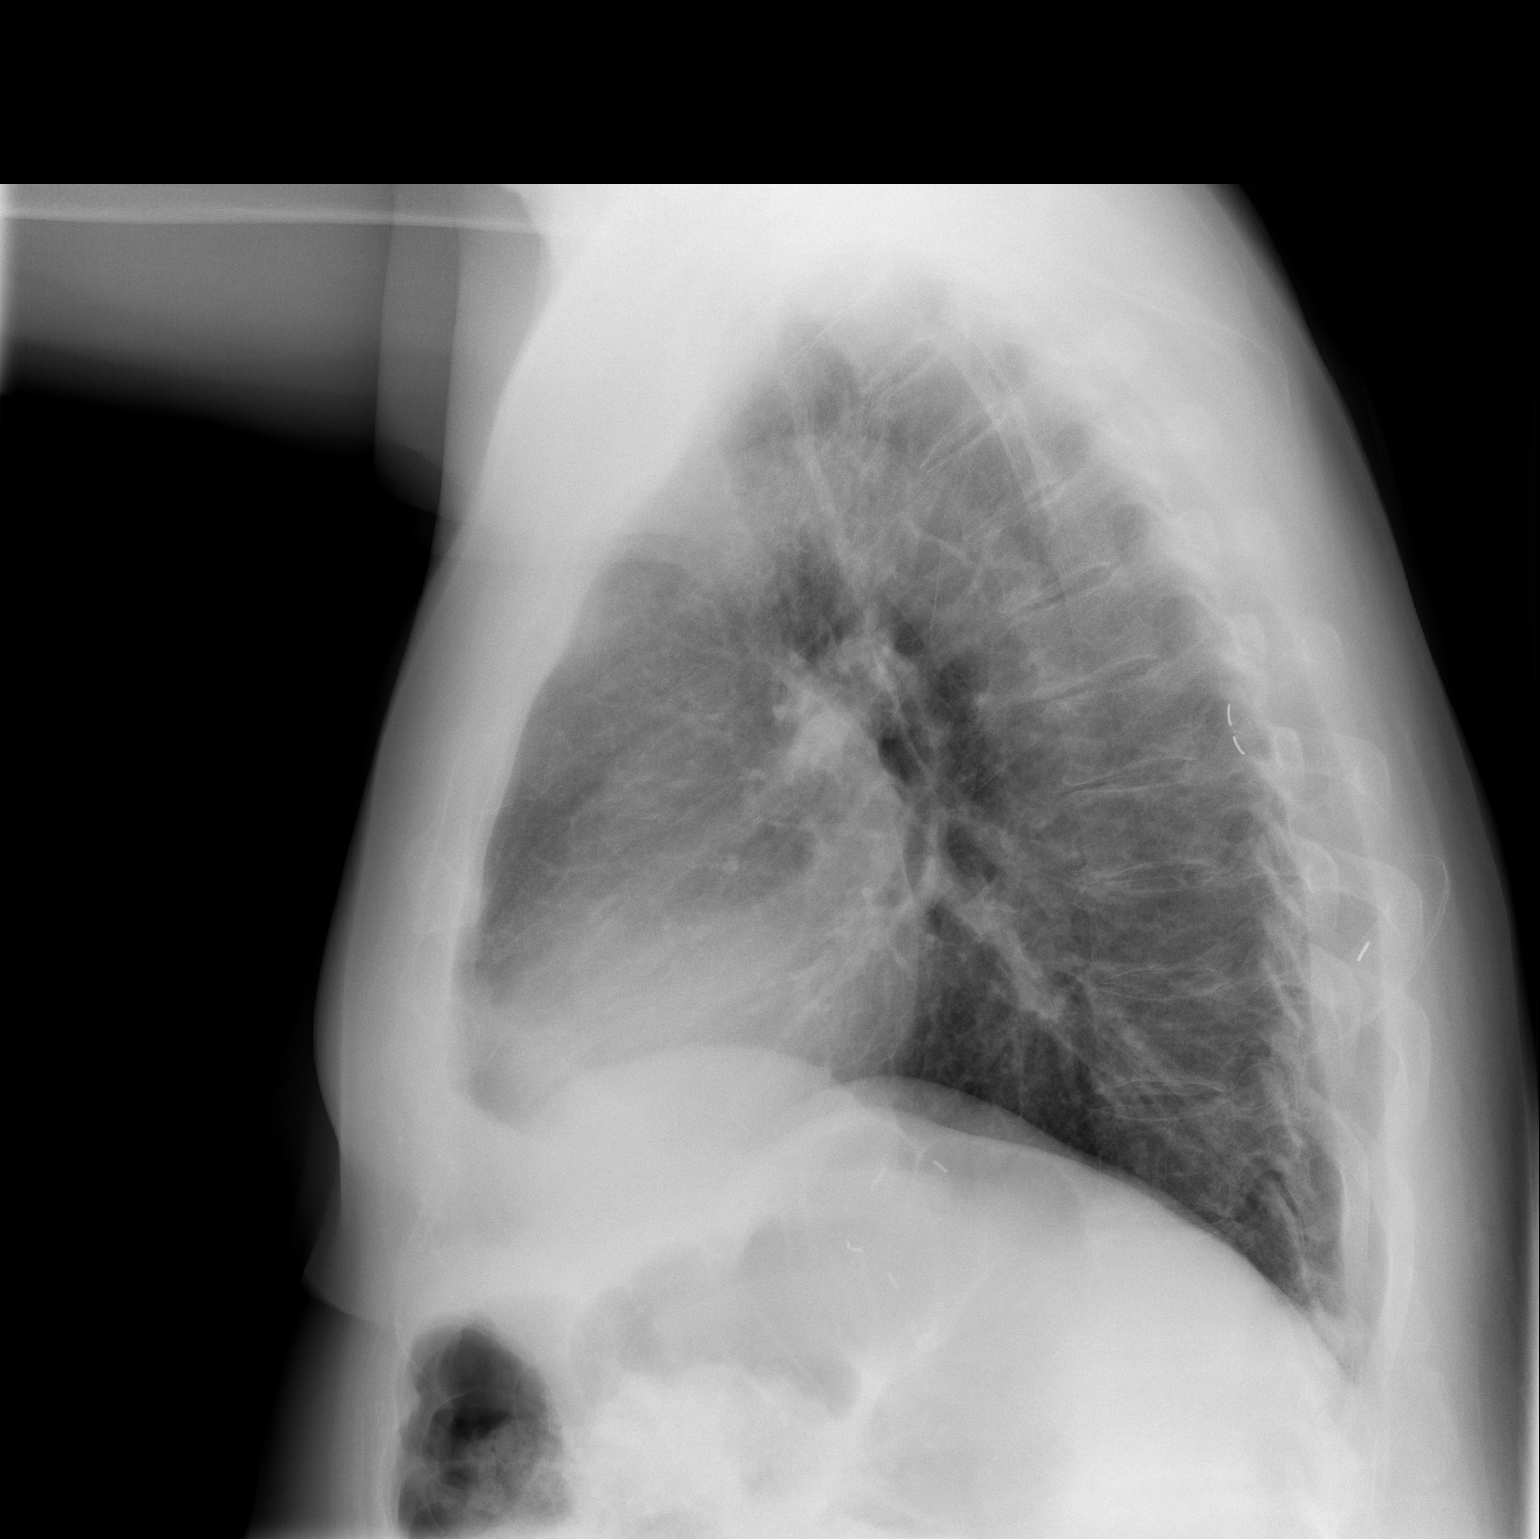

[2 of 2 positions shown; findings below may reference images not displayed]

FINDINGS: Lungs are clear. No effusions. Heart is normal size. No acute bony
abnormality.
IMPRESSION: No active cardiopulmonary disease.

## 2018-11-21 DIAGNOSIS — Z7189 Other specified counseling: Secondary | ICD-10-CM | POA: Diagnosis not present

## 2018-12-22 DIAGNOSIS — G894 Chronic pain syndrome: Secondary | ICD-10-CM | POA: Diagnosis not present

## 2018-12-22 DIAGNOSIS — E118 Type 2 diabetes mellitus with unspecified complications: Secondary | ICD-10-CM | POA: Diagnosis not present

## 2018-12-22 DIAGNOSIS — E1165 Type 2 diabetes mellitus with hyperglycemia: Secondary | ICD-10-CM | POA: Diagnosis not present

## 2018-12-22 DIAGNOSIS — G629 Polyneuropathy, unspecified: Secondary | ICD-10-CM | POA: Diagnosis not present

## 2018-12-22 DIAGNOSIS — G039 Meningitis, unspecified: Secondary | ICD-10-CM | POA: Diagnosis not present

## 2018-12-22 DIAGNOSIS — I10 Essential (primary) hypertension: Secondary | ICD-10-CM | POA: Diagnosis not present

## 2019-02-12 ENCOUNTER — Other Ambulatory Visit: Payer: Self-pay

## 2019-03-17 DIAGNOSIS — M545 Low back pain: Secondary | ICD-10-CM | POA: Diagnosis not present

## 2019-03-17 DIAGNOSIS — I10 Essential (primary) hypertension: Secondary | ICD-10-CM | POA: Diagnosis not present

## 2019-03-17 DIAGNOSIS — G8929 Other chronic pain: Secondary | ICD-10-CM | POA: Diagnosis not present

## 2019-03-17 DIAGNOSIS — M5489 Other dorsalgia: Secondary | ICD-10-CM | POA: Diagnosis not present

## 2019-03-17 DIAGNOSIS — M5416 Radiculopathy, lumbar region: Secondary | ICD-10-CM | POA: Diagnosis not present

## 2019-03-17 DIAGNOSIS — F17210 Nicotine dependence, cigarettes, uncomplicated: Secondary | ICD-10-CM | POA: Diagnosis not present

## 2019-03-19 DIAGNOSIS — F4489 Other dissociative and conversion disorders: Secondary | ICD-10-CM | POA: Diagnosis not present

## 2019-03-19 DIAGNOSIS — E872 Acidosis: Secondary | ICD-10-CM | POA: Diagnosis not present

## 2019-03-19 DIAGNOSIS — R569 Unspecified convulsions: Secondary | ICD-10-CM | POA: Diagnosis not present

## 2019-03-19 DIAGNOSIS — I119 Hypertensive heart disease without heart failure: Secondary | ICD-10-CM | POA: Diagnosis not present

## 2019-03-19 DIAGNOSIS — F22 Delusional disorders: Secondary | ICD-10-CM | POA: Diagnosis not present

## 2019-03-19 DIAGNOSIS — R000 Tachycardia, unspecified: Secondary | ICD-10-CM | POA: Diagnosis not present

## 2019-03-19 DIAGNOSIS — N179 Acute kidney failure, unspecified: Secondary | ICD-10-CM | POA: Diagnosis not present

## 2019-03-19 DIAGNOSIS — R45851 Suicidal ideations: Secondary | ICD-10-CM | POA: Diagnosis not present

## 2019-03-19 DIAGNOSIS — G8929 Other chronic pain: Secondary | ICD-10-CM | POA: Diagnosis not present

## 2019-03-19 DIAGNOSIS — R4182 Altered mental status, unspecified: Secondary | ICD-10-CM | POA: Diagnosis not present

## 2019-03-19 DIAGNOSIS — M549 Dorsalgia, unspecified: Secondary | ICD-10-CM | POA: Diagnosis not present

## 2019-03-19 DIAGNOSIS — E860 Dehydration: Secondary | ICD-10-CM | POA: Diagnosis not present

## 2019-03-19 DIAGNOSIS — F17210 Nicotine dependence, cigarettes, uncomplicated: Secondary | ICD-10-CM | POA: Diagnosis not present

## 2019-03-20 DIAGNOSIS — F22 Delusional disorders: Secondary | ICD-10-CM | POA: Diagnosis not present

## 2019-03-28 ENCOUNTER — Emergency Department
Admission: EM | Admit: 2019-03-28 | Discharge: 2019-03-28 | Discharge status: Absent without leave | Payer: Medicare Other

## 2019-09-01 DIAGNOSIS — I469 Cardiac arrest, cause unspecified: Secondary | ICD-10-CM | POA: Diagnosis not present

## 2019-09-14 DIAGNOSIS — 419620001 Death: Secondary | SNOMED CT | POA: Diagnosis not present

## 2019-09-14 DEATH — deceased
